# Patient Record
Sex: Female | Born: 1945 | Race: White | Hispanic: No | Smoking: Former smoker
Health system: Southern US, Community
[De-identification: ages and names within clinical notes are randomized; demographics above are authoritative.]

## PROBLEM LIST (undated history)

## (undated) DIAGNOSIS — E039 Hypothyroidism, unspecified: Secondary | ICD-10-CM

## (undated) DIAGNOSIS — I499 Cardiac arrhythmia, unspecified: Secondary | ICD-10-CM

## (undated) DIAGNOSIS — G629 Polyneuropathy, unspecified: Secondary | ICD-10-CM

## (undated) DIAGNOSIS — Z8489 Family history of other specified conditions: Secondary | ICD-10-CM

## (undated) DIAGNOSIS — Z87442 Personal history of urinary calculi: Secondary | ICD-10-CM

## (undated) DIAGNOSIS — M1990 Unspecified osteoarthritis, unspecified site: Secondary | ICD-10-CM

## (undated) DIAGNOSIS — R7303 Prediabetes: Secondary | ICD-10-CM

## (undated) DIAGNOSIS — I10 Essential (primary) hypertension: Secondary | ICD-10-CM

## (undated) DIAGNOSIS — E785 Hyperlipidemia, unspecified: Secondary | ICD-10-CM

## (undated) DIAGNOSIS — N200 Calculus of kidney: Secondary | ICD-10-CM

## (undated) HISTORY — DX: Essential (primary) hypertension: I10

## (undated) HISTORY — PX: TONSILLECTOMY: SUR1361

## (undated) HISTORY — DX: Hyperlipidemia, unspecified: E78.5

## (undated) HISTORY — DX: Hypothyroidism, unspecified: E03.9

## (undated) HISTORY — DX: Polyneuropathy, unspecified: G62.9

## (undated) HISTORY — DX: Calculus of kidney: N20.0

## (undated) HISTORY — PX: POLYPECTOMY: SHX149

## (undated) SURGERY — ATRIAL FIBRILLATION ABLATION
Anesthesia: General

---

## 1978-03-06 HISTORY — PX: EXCISION MORTON'S NEUROMA: SHX5013

## 1995-03-07 HISTORY — PX: CARPAL TUNNEL RELEASE: SHX101

## 1998-03-06 HISTORY — PX: BREAST ENHANCEMENT SURGERY: SHX7

## 1998-09-15 ENCOUNTER — Ambulatory Visit (HOSPITAL_BASED_OUTPATIENT_CLINIC_OR_DEPARTMENT_OTHER): Admission: RE | Admit: 1998-09-15 | Discharge: 1998-09-15 | Payer: Self-pay | Admitting: Plastic Surgery

## 1999-01-11 ENCOUNTER — Other Ambulatory Visit: Admission: RE | Admit: 1999-01-11 | Discharge: 1999-01-11 | Payer: Self-pay | Admitting: Obstetrics and Gynecology

## 1999-08-16 ENCOUNTER — Other Ambulatory Visit: Admission: RE | Admit: 1999-08-16 | Discharge: 1999-08-16 | Payer: Self-pay | Admitting: Obstetrics and Gynecology

## 1999-08-16 ENCOUNTER — Encounter (INDEPENDENT_AMBULATORY_CARE_PROVIDER_SITE_OTHER): Payer: Self-pay

## 1999-08-16 ENCOUNTER — Encounter: Payer: Self-pay | Admitting: Orthopedic Surgery

## 1999-08-16 ENCOUNTER — Encounter: Admission: RE | Admit: 1999-08-16 | Discharge: 1999-08-16 | Payer: Self-pay | Admitting: Orthopedic Surgery

## 2000-02-06 ENCOUNTER — Other Ambulatory Visit: Admission: RE | Admit: 2000-02-06 | Discharge: 2000-02-06 | Payer: Self-pay | Admitting: Obstetrics and Gynecology

## 2001-01-17 ENCOUNTER — Encounter: Payer: Self-pay | Admitting: Family Medicine

## 2001-01-17 ENCOUNTER — Encounter: Admission: RE | Admit: 2001-01-17 | Discharge: 2001-01-17 | Payer: Self-pay | Admitting: Family Medicine

## 2001-02-28 ENCOUNTER — Other Ambulatory Visit: Admission: RE | Admit: 2001-02-28 | Discharge: 2001-02-28 | Payer: Self-pay | Admitting: Obstetrics and Gynecology

## 2002-03-02 ENCOUNTER — Encounter: Payer: Self-pay | Admitting: Emergency Medicine

## 2002-03-02 ENCOUNTER — Emergency Department (HOSPITAL_COMMUNITY): Admission: EM | Admit: 2002-03-02 | Discharge: 2002-03-02 | Payer: Self-pay | Admitting: Emergency Medicine

## 2002-03-03 ENCOUNTER — Other Ambulatory Visit: Admission: RE | Admit: 2002-03-03 | Discharge: 2002-03-03 | Payer: Self-pay | Admitting: Obstetrics and Gynecology

## 2003-03-07 HISTORY — PX: ABDOMINAL HYSTERECTOMY: SHX81

## 2003-03-07 HISTORY — PX: TARSAL TUNNEL RELEASE: SUR1099

## 2003-03-09 ENCOUNTER — Other Ambulatory Visit: Admission: RE | Admit: 2003-03-09 | Discharge: 2003-03-09 | Payer: Self-pay | Admitting: Obstetrics and Gynecology

## 2003-08-20 ENCOUNTER — Inpatient Hospital Stay (HOSPITAL_COMMUNITY): Admission: RE | Admit: 2003-08-20 | Discharge: 2003-08-22 | Payer: Self-pay | Admitting: Obstetrics and Gynecology

## 2003-08-20 ENCOUNTER — Encounter (INDEPENDENT_AMBULATORY_CARE_PROVIDER_SITE_OTHER): Payer: Self-pay | Admitting: Specialist

## 2004-03-06 HISTORY — PX: KNEE ARTHROSCOPY: SUR90

## 2004-07-13 ENCOUNTER — Ambulatory Visit: Payer: Self-pay | Admitting: Family Medicine

## 2004-07-22 ENCOUNTER — Ambulatory Visit: Payer: Self-pay | Admitting: Family Medicine

## 2004-07-26 ENCOUNTER — Ambulatory Visit: Payer: Self-pay | Admitting: Family Medicine

## 2004-07-28 ENCOUNTER — Encounter: Admission: RE | Admit: 2004-07-28 | Discharge: 2004-07-28 | Payer: Self-pay | Admitting: Family Medicine

## 2004-08-17 ENCOUNTER — Encounter: Admission: RE | Admit: 2004-08-17 | Discharge: 2004-08-17 | Payer: Self-pay | Admitting: Family Medicine

## 2004-08-17 ENCOUNTER — Other Ambulatory Visit: Admission: RE | Admit: 2004-08-17 | Discharge: 2004-08-17 | Payer: Self-pay | Admitting: Interventional Radiology

## 2004-08-17 ENCOUNTER — Encounter (INDEPENDENT_AMBULATORY_CARE_PROVIDER_SITE_OTHER): Payer: Self-pay | Admitting: Specialist

## 2004-08-25 ENCOUNTER — Other Ambulatory Visit: Admission: RE | Admit: 2004-08-25 | Discharge: 2004-08-25 | Payer: Self-pay | Admitting: Obstetrics and Gynecology

## 2004-09-23 ENCOUNTER — Ambulatory Visit: Payer: Self-pay | Admitting: Family Medicine

## 2004-10-13 ENCOUNTER — Ambulatory Visit (HOSPITAL_COMMUNITY): Admission: RE | Admit: 2004-10-13 | Discharge: 2004-10-13 | Payer: Self-pay | Admitting: Neurology

## 2005-01-20 ENCOUNTER — Ambulatory Visit: Payer: Self-pay | Admitting: Family Medicine

## 2005-05-25 ENCOUNTER — Ambulatory Visit: Payer: Self-pay | Admitting: Family Medicine

## 2005-05-26 ENCOUNTER — Ambulatory Visit: Payer: Self-pay | Admitting: Family Medicine

## 2005-05-30 ENCOUNTER — Encounter: Admission: RE | Admit: 2005-05-30 | Discharge: 2005-05-30 | Payer: Self-pay | Admitting: Family Medicine

## 2005-06-15 ENCOUNTER — Ambulatory Visit: Payer: Self-pay | Admitting: Family Medicine

## 2005-09-15 ENCOUNTER — Ambulatory Visit: Payer: Self-pay | Admitting: Family Medicine

## 2005-10-03 ENCOUNTER — Ambulatory Visit: Payer: Self-pay | Admitting: Family Medicine

## 2006-08-31 ENCOUNTER — Encounter: Admission: RE | Admit: 2006-08-31 | Discharge: 2006-08-31 | Payer: Self-pay | Admitting: Family Medicine

## 2006-09-19 DIAGNOSIS — K219 Gastro-esophageal reflux disease without esophagitis: Secondary | ICD-10-CM | POA: Insufficient documentation

## 2006-09-19 DIAGNOSIS — I10 Essential (primary) hypertension: Secondary | ICD-10-CM | POA: Insufficient documentation

## 2006-10-05 ENCOUNTER — Ambulatory Visit: Payer: Self-pay | Admitting: Family Medicine

## 2006-10-05 LAB — CONVERTED CEMR LAB
ALT: 44 units/L — ABNORMAL HIGH (ref 0–35)
AST: 35 units/L (ref 0–37)
Albumin: 4.3 g/dL (ref 3.5–5.2)
Alkaline Phosphatase: 51 units/L (ref 39–117)
BUN: 13 mg/dL (ref 6–23)
Basophils Absolute: 0 10*3/uL (ref 0.0–0.1)
Basophils Relative: 0.6 % (ref 0.0–1.0)
Bilirubin, Direct: 0.1 mg/dL (ref 0.0–0.3)
CO2: 33 meq/L — ABNORMAL HIGH (ref 19–32)
Calcium: 10.1 mg/dL (ref 8.4–10.5)
Chloride: 101 meq/L (ref 96–112)
Cholesterol: 191 mg/dL (ref 0–200)
Creatinine, Ser: 0.9 mg/dL (ref 0.4–1.2)
Eosinophils Absolute: 0.1 10*3/uL (ref 0.0–0.6)
Eosinophils Relative: 2.3 % (ref 0.0–5.0)
GFR calc Af Amer: 82 mL/min
GFR calc non Af Amer: 68 mL/min
Glucose, Bld: 106 mg/dL — ABNORMAL HIGH (ref 70–99)
HCT: 40 % (ref 36.0–46.0)
HDL: 46.4 mg/dL (ref >39.0)
Hemoglobin: 13.8 g/dL (ref 12.0–15.0)
LDL Cholesterol: 105 mg/dL — ABNORMAL HIGH (ref 0–99)
Lymphocytes Relative: 44.2 % (ref 12.0–46.0)
MCHC: 34.4 g/dL (ref 30.0–36.0)
MCV: 92.6 fL (ref 78.0–100.0)
Monocytes Absolute: 0.7 10*3/uL (ref 0.2–0.7)
Monocytes Relative: 14.6 % — ABNORMAL HIGH (ref 3.0–11.0)
Neutro Abs: 1.8 10*3/uL (ref 1.4–7.7)
Neutrophils Relative %: 38.3 % — ABNORMAL LOW (ref 43.0–77.0)
Platelets: 319 10*3/uL (ref 150–400)
Potassium: 3.8 meq/L (ref 3.5–5.1)
RBC: 4.32 M/uL (ref 3.87–5.11)
RDW: 12.3 % (ref 11.5–14.6)
Sodium: 144 meq/L (ref 135–145)
TSH: 1.18 microintl units/mL (ref 0.35–5.50)
Total Bilirubin: 0.9 mg/dL (ref 0.3–1.2)
Total CHOL/HDL Ratio: 4.1
Total Protein: 6.8 g/dL (ref 6.0–8.3)
Triglycerides: 198 mg/dL — ABNORMAL HIGH (ref 0–149)
VLDL: 40 mg/dL (ref 0–40)
WBC: 4.6 10*3/uL (ref 4.5–10.5)

## 2006-10-12 ENCOUNTER — Ambulatory Visit: Payer: Self-pay | Admitting: Family Medicine

## 2006-10-12 DIAGNOSIS — E042 Nontoxic multinodular goiter: Secondary | ICD-10-CM | POA: Insufficient documentation

## 2006-10-12 DIAGNOSIS — E7800 Pure hypercholesterolemia, unspecified: Secondary | ICD-10-CM | POA: Insufficient documentation

## 2006-10-26 ENCOUNTER — Encounter: Payer: Self-pay | Admitting: Family Medicine

## 2006-10-26 ENCOUNTER — Ambulatory Visit: Payer: Self-pay | Admitting: Internal Medicine

## 2006-11-06 ENCOUNTER — Telehealth (INDEPENDENT_AMBULATORY_CARE_PROVIDER_SITE_OTHER): Payer: Self-pay | Admitting: *Deleted

## 2007-01-29 ENCOUNTER — Ambulatory Visit: Payer: Self-pay | Admitting: Family Medicine

## 2007-03-07 HISTORY — PX: PAROTID GLAND TUMOR EXCISION: SHX5221

## 2007-11-01 ENCOUNTER — Ambulatory Visit: Payer: Self-pay | Admitting: Internal Medicine

## 2007-11-08 ENCOUNTER — Encounter: Payer: Self-pay | Admitting: Internal Medicine

## 2007-11-08 ENCOUNTER — Ambulatory Visit: Payer: Self-pay | Admitting: Internal Medicine

## 2007-11-12 ENCOUNTER — Encounter: Payer: Self-pay | Admitting: Internal Medicine

## 2007-12-06 ENCOUNTER — Ambulatory Visit: Payer: Self-pay | Admitting: Family Medicine

## 2007-12-06 LAB — CONVERTED CEMR LAB
ALT: 28 units/L (ref 0–35)
AST: 22 units/L (ref 0–37)
Albumin: 4 g/dL (ref 3.5–5.2)
Alkaline Phosphatase: 51 units/L (ref 39–117)
BUN: 18 mg/dL (ref 6–23)
Basophils Absolute: 0 10*3/uL (ref 0.0–0.1)
Basophils Relative: 0.9 % (ref 0.0–3.0)
Bilirubin Urine: NEGATIVE
Bilirubin, Direct: 0.1 mg/dL (ref 0.0–0.3)
Blood in Urine, dipstick: NEGATIVE
CO2: 28 meq/L (ref 19–32)
Calcium: 9.4 mg/dL (ref 8.4–10.5)
Chloride: 106 meq/L (ref 96–112)
Cholesterol: 189 mg/dL (ref 0–200)
Creatinine, Ser: 1.1 mg/dL (ref 0.4–1.2)
Direct LDL: 110.6 mg/dL
Eosinophils Absolute: 0.1 10*3/uL (ref 0.0–0.7)
Eosinophils Relative: 3 % (ref 0.0–5.0)
GFR calc Af Amer: 65 mL/min
GFR calc non Af Amer: 53 mL/min
Glucose, Bld: 98 mg/dL (ref 70–99)
Glucose, Urine, Semiquant: NEGATIVE
HCT: 37.9 % (ref 36.0–46.0)
HDL: 42.3 mg/dL (ref >39.0)
Hemoglobin: 13.2 g/dL (ref 12.0–15.0)
Ketones, urine, test strip: NEGATIVE
Lymphocytes Relative: 41.5 % (ref 12.0–46.0)
MCHC: 34.9 g/dL (ref 30.0–36.0)
MCV: 91.4 fL (ref 78.0–100.0)
Monocytes Absolute: 0.4 10*3/uL (ref 0.1–1.0)
Monocytes Relative: 8.8 % (ref 3.0–12.0)
Neutro Abs: 2.4 10*3/uL (ref 1.4–7.7)
Neutrophils Relative %: 45.8 % (ref 43.0–77.0)
Nitrite: NEGATIVE
Platelets: 276 10*3/uL (ref 150–400)
Potassium: 4.3 meq/L (ref 3.5–5.1)
Protein, U semiquant: NEGATIVE
RBC: 4.15 M/uL (ref 3.87–5.11)
RDW: 12.5 % (ref 11.5–14.6)
Sodium: 142 meq/L (ref 135–145)
Specific Gravity, Urine: 1.015
TSH: 1.21 microintl units/mL (ref 0.35–5.50)
Total Bilirubin: 0.8 mg/dL (ref 0.3–1.2)
Total CHOL/HDL Ratio: 4.5
Total Protein: 6.4 g/dL (ref 6.0–8.3)
Triglycerides: 227 mg/dL (ref 0–149)
Urobilinogen, UA: 0.2
VLDL: 45 mg/dL — ABNORMAL HIGH (ref 0–40)
WBC: 5 10*3/uL (ref 4.5–10.5)
pH: 6

## 2007-12-12 ENCOUNTER — Encounter: Payer: Self-pay | Admitting: Family Medicine

## 2007-12-13 ENCOUNTER — Telehealth: Payer: Self-pay | Admitting: Family Medicine

## 2007-12-13 ENCOUNTER — Ambulatory Visit: Payer: Self-pay | Admitting: Family Medicine

## 2007-12-13 DIAGNOSIS — Z87442 Personal history of urinary calculi: Secondary | ICD-10-CM | POA: Insufficient documentation

## 2007-12-13 DIAGNOSIS — G609 Hereditary and idiopathic neuropathy, unspecified: Secondary | ICD-10-CM | POA: Insufficient documentation

## 2007-12-23 ENCOUNTER — Telehealth: Payer: Self-pay | Admitting: Family Medicine

## 2008-01-02 ENCOUNTER — Telehealth: Payer: Self-pay | Admitting: Family Medicine

## 2008-01-15 ENCOUNTER — Telehealth: Payer: Self-pay | Admitting: Family Medicine

## 2008-02-05 ENCOUNTER — Telehealth: Payer: Self-pay | Admitting: *Deleted

## 2008-03-03 ENCOUNTER — Telehealth: Payer: Self-pay | Admitting: Family Medicine

## 2008-03-11 ENCOUNTER — Ambulatory Visit: Payer: Self-pay | Admitting: Family Medicine

## 2008-04-24 ENCOUNTER — Telehealth: Payer: Self-pay | Admitting: Family Medicine

## 2008-12-29 ENCOUNTER — Telehealth: Payer: Self-pay | Admitting: Family Medicine

## 2009-01-01 ENCOUNTER — Telehealth: Payer: Self-pay | Admitting: Family Medicine

## 2009-04-09 ENCOUNTER — Telehealth: Payer: Self-pay | Admitting: Family Medicine

## 2009-04-14 ENCOUNTER — Telehealth: Payer: Self-pay | Admitting: Family Medicine

## 2009-04-20 ENCOUNTER — Ambulatory Visit: Payer: Self-pay | Admitting: Family Medicine

## 2009-04-20 LAB — CONVERTED CEMR LAB
ALT: 23 units/L (ref 0–35)
AST: 19 units/L (ref 0–37)
Albumin: 4.2 g/dL (ref 3.5–5.2)
Alkaline Phosphatase: 55 units/L (ref 39–117)
BUN: 20 mg/dL (ref 6–23)
Basophils Absolute: 0 10*3/uL (ref 0.0–0.1)
Basophils Relative: 0.6 % (ref 0.0–3.0)
Bilirubin Urine: NEGATIVE
Bilirubin, Direct: 0.1 mg/dL (ref 0.0–0.3)
CO2: 29 meq/L (ref 19–32)
Calcium: 9.6 mg/dL (ref 8.4–10.5)
Chloride: 109 meq/L (ref 96–112)
Cholesterol: 170 mg/dL (ref 0–200)
Creatinine, Ser: 1 mg/dL (ref 0.4–1.2)
Direct LDL: 95.3 mg/dL
Eosinophils Absolute: 0.1 10*3/uL (ref 0.0–0.7)
Eosinophils Relative: 1.9 % (ref 0.0–5.0)
GFR calc non Af Amer: 59.29 mL/min (ref >60)
Glucose, Bld: 102 mg/dL — ABNORMAL HIGH (ref 70–99)
HCT: 40.8 % (ref 36.0–46.0)
HDL: 46.9 mg/dL (ref >39.00)
Hemoglobin, Urine: NEGATIVE
Hemoglobin: 13.8 g/dL (ref 12.0–15.0)
Ketones, ur: NEGATIVE mg/dL
Lymphocytes Relative: 36.8 % (ref 12.0–46.0)
Lymphs Abs: 1.5 10*3/uL (ref 0.7–4.0)
MCHC: 33.7 g/dL (ref 30.0–36.0)
MCV: 92.5 fL (ref 78.0–100.0)
Monocytes Absolute: 0.3 10*3/uL (ref 0.1–1.0)
Monocytes Relative: 8.2 % (ref 3.0–12.0)
Neutro Abs: 2.3 10*3/uL (ref 1.4–7.7)
Neutrophils Relative %: 52.5 % (ref 43.0–77.0)
Nitrite: NEGATIVE
Platelets: 259 10*3/uL (ref 150.0–400.0)
Potassium: 4.3 meq/L (ref 3.5–5.1)
RBC: 4.41 M/uL (ref 3.87–5.11)
RDW: 11.8 % (ref 11.5–14.6)
Sodium: 144 meq/L (ref 135–145)
Specific Gravity, Urine: 1.02 (ref 1.000–1.030)
TSH: 1.06 microintl units/mL (ref 0.35–5.50)
Total Bilirubin: 0.5 mg/dL (ref 0.3–1.2)
Total CHOL/HDL Ratio: 4
Total Protein, Urine: NEGATIVE mg/dL
Total Protein: 6.6 g/dL (ref 6.0–8.3)
Triglycerides: 257 mg/dL — ABNORMAL HIGH (ref 0.0–149.0)
Urine Glucose: NEGATIVE mg/dL
Urobilinogen, UA: 0.2 (ref 0.0–1.0)
VLDL: 51.4 mg/dL — ABNORMAL HIGH (ref 0.0–40.0)
WBC: 4.2 10*3/uL — ABNORMAL LOW (ref 4.5–10.5)
pH: 5.5 (ref 5.0–8.0)

## 2009-04-27 ENCOUNTER — Ambulatory Visit: Payer: Self-pay | Admitting: Family Medicine

## 2010-03-06 HISTORY — PX: SPINAL CORD STIMULATOR IMPLANT: SHX2422

## 2010-04-05 NOTE — Progress Notes (Signed)
Summary: Lipitor  Phone Note Call from Patient   Caller: Patient Call For: Dr. Tawanna Cooler Summary of Call: Pt researched Lipitor (online), and found that she can get a 30 day trial if Dr. Tawanna Cooler will write a 30 day supply and let her pick up the prescription. Please call pt when ready.[phone] Initial call taken by: Lynann Beaver CMA,  February 05, 2008 12:12 PM  Follow-up for Phone Call        [facility] Follow-up by: Kern Reap CMA,  February 05, 2008 1:43 PM    New/Updated Medications: LIPITOR 10 MG TABS (ATORVASTATIN CALCIUM) one tab at bedtime ok,Now prescription for Lipitor 10 mg dispense 30 tablets directions one nightly 11 refills, then call Her  to come by and pick up the prescription  Prescriptions: LIPITOR 10 MG TABS (ATORVASTATIN CALCIUM) one tab at bedtime  #30 x 0   Entered by:   Kern Reap CMA   Authorized by:   Roderick Pee MD   Signed by:   Kern Reap CMA on 02/05/2008   Method used:   [facility] [address]  [id number]       [address]       [place], [place]  [address]       Ph: [phone] or [phone]       Fax: [phone]   RxID:   [id number] LIPITOR 10 MG TABS (ATORVASTATIN CALCIUM) one tab at bedtime  #30 x 11   Entered by:   Kern Reap CMA   Authorized by:   Roderick Pee MD   Signed by:   Kern Reap CMA on 02/05/2008   Method used:   Print then Give to Patient   RxID:   [id number]

## 2010-04-05 NOTE — Progress Notes (Signed)
Summary: Mail-order Ambien RX request  Phone Note Call from Patient   Caller: Patient Call For: Jock Mahon Summary of Call: Pt had CPX this AM, reports Dr Tawanna Cooler forgot to give her Rx for Ambien. Call pt home, leave a message for her to pick-up written Rx. Initial call taken by: Sid Falcon LPN,  December 13, 2007 5:25 PM  Follow-up for Phone Call        ambien 10 mg, dispensed 30 tablets, directions one at bedtime as needed for sleep 5 refills.  Okay to call in or if patient wants a hard copy of her prescription have her come pick it up Follow-up by: Roderick Pee MD,  December 16, 2007 1:11 PM  Additional Follow-up for Phone Call Additional follow up Details #1::        left message on machine  Additional Follow-up by: Kern Reap CMA,  December 17, 2007 11:03 AM    Additional Follow-up for Phone Call Additional follow up Details #2::    Pt called back, requesting generic mail order, call [phone] when ready for pick-up. Sid Falcon LPN  December 17, 2007 12:49 PM   Additional Follow-up for Phone Call Additional follow up Details #3:: Details for Additional Follow-up Action Taken: left message on machine that rx is ready for pick up Additional Follow-up by: Kern Reap CMA,  December 17, 2007 2:09 PM  New/Updated Medications: ZOLPIDEM TARTRATE 10 MG TABS (ZOLPIDEM TARTRATE) take one tab at bedtime as needed sleep   Prescriptions: ZOLPIDEM TARTRATE 10 MG TABS (ZOLPIDEM TARTRATE) take one tab at bedtime as needed sleep  #30 x 5   Entered by:   Kern Reap CMA   Authorized by:   Roderick Pee MD   Signed by:   Kern Reap CMA on 12/17/2007   Method used:   Print then Give to Patient   RxID:   [id number]

## 2010-04-05 NOTE — Progress Notes (Signed)
Summary: resent rx       New/Updated Medications: LISINOPRIL-HYDROCHLOROTHIAZIDE 20-12.5 MG  TABS (LISINOPRIL-HYDROCHLOROTHIAZIDE) half tab by mouth   once daily Prescriptions: LISINOPRIL-HYDROCHLOROTHIAZIDE 20-12.5 MG  TABS (LISINOPRIL-HYDROCHLOROTHIAZIDE) half tab by mouth   once daily  #90 x 1   Entered by:   Kern Reap CMA (AAMA)   Authorized by:   Roderick Pee MD   Signed by:   Kern Reap CMA (AAMA) on 01/01/2009   Method used:   Electronically to        [facility] (mail-order)             ,          Ph: [phone]       Fax: [phone]   RxID:   [id number]

## 2010-04-05 NOTE — Progress Notes (Signed)
Summary: vitamin D?  Phone Note Call from Patient Call back at Work Phone [phone]   Summary of Call: Request Vitamin D, B and whatever else, levels be checked with labs already scheduled 2-15-11for idiopathic peripheral neuropathy.   Initial call taken by: Rudy Jew, RN,  April 14, 2009 8:25 AM  Follow-up for Phone Call        okay to add B12 level folate level iron, iron saturation, sed rate, ANA Follow-up by: Roderick Pee MD,  April 14, 2009 1:24 PM  Additional Follow-up for Phone Call Additional follow up Details #1::        What about the Vit D the patient requested? Additional Follow-up by: Rudy Jew, RN,  April 14, 2009 1:49 PM    Additional Follow-up for Phone Call Additional follow up Details #2::    ok Follow-up by: Roderick Pee MD,  April 15, 2009 12:08 PM

## 2010-04-05 NOTE — Assessment & Plan Note (Signed)
Summary: CPX/CCM   Vital Signs:  Patient Profile:   65 Years Old Female Height:     56 inches Weight:      196 pounds Temp:     98.3 degrees F oral BP sitting:   130 / 80  (left arm) Cuff size:   regular  Vitals Entered By: Sid Falcon LPN (December 13, 2007 8:40 AM)                 Chief Complaint:  CPX and labbs done.  History of Present Illness: Brandi Spence is a 65 year old single female, who comes in today for evaluation of hyperlipidemia, hypertension, hypothyroidism, a left thyroid cyst, and peripheral neuropathy.  Her hypertension is managed with lisinopril, H. CT 2012.5 daily, and clonidine .1 daily.  BP 130/80.  The hyperlipidemia is managed with Zocor 20 mg a day at bedtime.  Lipids are ago, but she would like to stop it because she thinks it may be contributing to her leg pain.  Her hyaily.  The cyst in her left thyroid gland has remained stable.  The last ultrasound 2008 should the lesions to be stable   [facility] her neuropathy.  She is currently taking their to under milligrams b.i.d., Ambien, 10 mg at bedtime, dopamine 8 25 mg 3 tablets at bedtime.  Her last shot was 2008 she a flu shot recently and the shingles.  Vaccine.    Current Allergies: No known allergies   Past Medical History:    Reviewed history from 10/12/2006 and no changes required:       GERD       Hypertension       PMS       Hyperlipidemia       thyroid nodule       Peripheral neuropathy       childbirth x 2       BTL       carpal tunnel syndrome left hand       TAH and BSO for nonmalignant reasons       surgery right knee       Nephrolithiasis, hx of       reduction mammoplasty.              Morton's neuroma x 2   Family History:    Reviewed history from 09/19/2006 and no changes required:       Family History Other cancer-Colon, Breast       Family History of Stroke M 1st degree relative <50       Fam hx Vericose  Family History Depression       Family History Diabetes 1st degree relative       Family History Hypertension  Social History:    Reviewed history from 09/19/2006 and no changes required:       Single       Regular exercise-no       Occupation:       Former Smoker       Alcohol use-no       Drug use-no       Regular exercise-yes   Risk Factors:  Tobacco use:  quit    Year quit:  65 Drug use:  no Alcohol use:  no Exercise:  yes   Review of Systems      See HPI   Physical Exam  General:     Well-developed,well-nourished,in no acute distress; alert,appropriate and cooperative  throughout examination Head:     Normocephalic and atraumatic without obvious abnormalities. No apparent alopecia or balding. Eyes:     No corneal or conjunctival inflammation noted. EOMI. Perrla. Funduscopic exam benign, without hemorrhages, exudates or papilledema. Vision grossly normal. Ears:     External ear exam shows no significant lesions or deformities.  Otoscopic examination reveals clear canals, tympanic membranes are intact bilaterally without bulging, retraction, inflammation or discharge. Hearing is grossly normal bilaterally. Nose:     External nasal examination shows no deformity or inflammation. Nasal mucosa are pink and moist without lesions or exudates. Mouth:     Oral mucosa and oropharynx without lesions or exudates.  Teeth in good repair. Neck:     there is a robin egg size, soft, cystic lesion in the superior to mid pole of her left thyroid.  There is also a pea-sized lesion.  The superior portion of her right ear consistent with a cyst Chest Wall:     No deformities, masses, or tenderness noted. Breasts:     No mass, nodules, thickening, tenderness, bulging, retraction, inflamation, nipple discharge or skin changes noted.  scars from reduction mammoplasty Lungs:     Normal respiratory effort, chest expands symmetrically. Lungs are clear to auscultation, no crackles or  wheezes. Heart:     Normal rate and regular rhythm. S1 and S2 normal without gallop, murmur, click, rub or other extra sounds. Abdomen:     Bowel sounds positive,abdomen soft and non-tender without masses, organomegaly or hernias noted. Msk:     No deformity or scoliosis noted of thoracic or lumbar spine.   Pulses:     R and L carotid,radial,femoral,dorsalis pedis and posterior tibial pulses are full and equal bilaterally Extremities:     No clubbing, cyanosis, edema, or deformity noted with normal full range of motion of all joints.   Neurologic:     No cranial nerve deficits noted. Station and gait are normal. Plantar reflexes are down-going bilaterally. DTRs are symmetrical throughout. Sensory, motor and coordinative functions appear intact. Skin:     Intact without suspicious lesions or rashes Cervical Nodes:     No lymphadenopathy noted Axillary Nodes:     No palpable lymphadenopathy Inguinal Nodes:     No significant adenopathy Psych:     Cognition and judgment appear intact. Alert and cooperative with normal attention span and concentration. 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 1 at bedtime   Problem # 4:  HYPERTENSION (ICD-401.9) Assessment: Improved  Her updated medication list for this problem includes:    Lisinopril-hydrochlorothiazide 20-12.5 Mg Tabs (Lisinopril-hydrochlorothiazide) .Marland Kitchen... 1 once daily    Clonidine Hcl 0.1 Mg Tabs (Clonidine hcl) .Marland Kitchen... 1 once daily  Orders: EKG w/ Interpretation (93000)   Complete Medication List: 1)  Lisinopril-hydrochlorothiazide 20-12.5 Mg Tabs  (Lisinopril-hydrochlorothiazide) .Marland Kitchen.. 1 once daily 2)  Clonidine Hcl 0.1 Mg Tabs (Clonidine hcl) .Marland Kitchen.. 1 once daily 3)  Zocor 20 Mg Tabs (Simvastatin) .Marland Kitchen.. 1 at bedtime 4)  Synthroid 100 Mcg Tabs (Levothyroxine sodium) .Marland Kitchen.. 1 tab once daily 5)  Lyrica 100 Mg Caps (Pregabalin) .Marland Kitchen.. 1 tab two times a day 6)  Ambien 10 Mg Tabs (Zolpidem tartrate) .... As needed hs 7)  Topiramate 25 Mg Tabs (Topiramate) .... 3 tabs at hs   Patient Instructions: 1)  Please schedule a follow-up appointment in 1 year. 2)  Schedule your mammogram. 3)  Schedule a colonoscopy/sigmoidoscopy to help detect colon cancer. 4)  Take an Aspirin every day. 5)  stopped the Zocor for one month to see if it has an effect on your like symptoms.   Prescriptions: SYNTHROID 100 MCG  TABS (LEVOTHYROXINE SODIUM) 1 tab once daily  #100 x 3   Entered and Authorized by:   Roderick Pee MD   Signed by:   Roderick Pee MD on 12/13/2007   Method used:   Print then Give to Patient   RxID:   [id number] ZOCOR 20 MG  TABS (SIMVASTATIN) 1 at bedtime  #100 x 3   Entered and Authorized by:   Roderick Pee MD   Signed by:   Roderick Pee MD on 12/13/2007   Method used:   Print then Give to Patient   RxID:   [id number] CLONIDINE HCL 0.1 MG  TABS (CLONIDINE HCL) 1 once daily  #100 x 3   Entered and Authorized by:   Roderick Pee MD   Signed by:   Roderick Pee MD on 12/13/2007   Method used:   Print then Give to Patient   RxID:   [id number] LISINOPRIL-HYDROCHLOROTHIAZIDE 20-12.5 MG  TABS (LISINOPRIL-HYDROCHLOROTHIAZIDE) 1 once daily  #100 x 3   Entered and Authorized by:   Roderick Pee MD   Signed by:   Roderick Pee MD on 12/13/2007   Method used:   Print then Give to Patient   RxID:   [id number] SYNTHROID 100 MCG  TABS (LEVOTHYROXINE SODIUM) 1 tab once daily  #100 x 3   Entered and Authorized by:   Roderick Pee MD   Signed by:   Roderick Pee MD on 12/13/2007   Method used:   [facility]         [facility] [address]  [facility]       [address]       [place], [place]  [address]       Ph: [phone] or [phone]       Fax: [phone]   RxID:   [id number] CLONIDINE HCL 0.1 MG  TABS (CLONIDINE HCL) 1 once daily  #100 x 3   Entered and Authorized by:   Roderick Pee MD   Signed by:   Roderick Pee MD on 12/13/2007   Method used:   [facility]        [facility]  [address]  [facility]       [address]       [place], [place]  [address]       Ph: [phone] or [phone]       Fax: [phone]   RxID:   [id number] LISINOPRIL-HYDROCHLOROTHIAZIDE 20-12.5 MG  TABS (LISINOPRIL-HYDROCHLOROTHIAZIDE) 1 once daily  #100 x 3   Entered and Authorized by:   Roderick Pee MD   Signed by:   Roderick Pee MD on 12/13/2007   Method used:   [facility]        [facility]  [address]  [facility]       [address]       [facility],  [place]  [address]       Ph: [phone] or [phone]       Fax: [phone]   RxID:   [id number]  ]
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
## 2010-04-05 NOTE — Letter (Signed)
Summary: Patient Notice- Polyp Results  [facility]  [address] [place], [place] [address]   Phone: [phone]  Fax: [phone]        November 12, 2007 MRN: [id number]    Brandi Spence [address] [place], [place]  [address]    Dear [name],  I am pleased to inform you that the colon polyp(s) removed during your recent colonoscopy was (were) found to be benign (no cancer detected) upon pathologic examination.The polyp was adenomatous ( precancerous)  I recommend you have a repeat colonoscopy examination in 5_ years to look for recurrent polyps, as having colon polyps increases your risk for having recurrent polyps or even colon cancer in the future.  Should you develop new or worsening symptoms of abdominal pain, bowel habit changes or bleeding from the rectum or bowels, please schedule an evaluation with either your primary care physician or with me.  Additional information/recommendations:  _x_ No further action with gastroenterology is needed at this time. Please      follow-up with your primary care physician for your other healthcare      ne  Please call us if you are having persistent problems or have questions about your condition that have not been fully answered at this time.  Sincerely,  Hart Carwin MD  This letter has been electronically signed by your physician.

## 2010-04-05 NOTE — Assessment & Plan Note (Signed)
Summary: cpx/cjr   Vital Signs:  Patient profile:   65 year old female Height:      66.75 inches Weight:      195 pounds BMI:     30.88 Temp:     98.5 degrees F oral BP sitting:   118 / 78  (left arm) Cuff size:   regular  Vitals Entered By: Kern Reap CMA Duncan Dull) (April 27, 2009 3:22 PM)  History of Present Illness: Brandi Spence is a 65 year old, married female, nonsmoker, who comes in today for evaluation of multiple issues.  She takes Synthroid 100 micrograms daily for hypothyroidism a TSH level is 1.06 continue the above dose.  She also has a history of a thyroid nodule on the left.  We did an ultrasound biopsy couple years ago, was negative.  She takes Crestor 10 mg daily for hyperlipidemia.  Lipids are goal.  She takes lisinopril 20 -- 12.5 daily for hypertension.  BP 118/78.  She takes later to 100 mg q.i.d. and Topamax 75 mg nightly.  These were given to her by her neurologist.  She has a peripheral neuropathy of unknown etiology.  She takes clonidine .1 daily for hot flushes and 81-mg baby aspirin and have also given her alprazolam .5 to take p.r.n.Marland Kitchen  Tetanus 2,008 seasonal flu 2010 shingles 2008 .  Pneumovax next year.  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  She also has tinnitus , which is new in her right ear.  No hearing loss.  She also has a lesion on her right face has been scaling ill peel then peel off heel and peel off  Allergies (verified): No Known Drug Allergies  Past History:  Past medical, surgical, family and social histories (including risk factors) reviewed, and no changes noted (except as noted below).  Past Medical History: Reviewed history from 12/13/2007 and no changes required. GERD Hypertension PMS Hyperlipidemia thyroid nodule Peripheral neuropathy childbirth x 2 BTL carpal tunnel syndrome left hand TAH and BSO for nonmalignant reasons surgery right knee Nephrolithiasis, hx of reduction  mammoplasty.  Morton's neuroma x 2  Past Surgical History: Reviewed history from 03/11/2008 and no changes required. CB x 2 BTL CT syndrome-Lt TAH/BSO Morton;s Neuromutz Colonoscopy-2004 right parotid gland removal 09  Family History: Reviewed history from 09/19/2006 and no changes required. Family History Other cancer-Colon, Breast Family History of Stroke M 1st degree relative <50 Fam hx Vericose Family History Depression Family History Diabetes 1st degree relative Family History Hypertension  Social History: Reviewed history from 12/13/2007 and no changes required. Single Regular exercise-no Occupation: Former Smoker Alcohol use-no Drug use-no Regular exercise-yes  Review of Systems      See HPI  Physical Exam  General:  Well-developed,well-nourished,in no acute distress; alert,appropriate and cooperative throughout examination Head:  Normocephalic and atraumatic without obvious abnormalities. No apparent alopecia or balding. Eyes:  No corneal or conjunctival inflammation noted. EOMI. Perrla. Funduscopic exam benign, without hemorrhages, exudates or papilledema. Vision grossly normal. Ears:  External ear exam shows no significant lesions or deformities.  Otoscopic examination reveals clear canals, tympanic membranes are intact bilaterally without bulging, retraction, inflammation or discharge. Hearing is grossly normal bilaterally. Nose:  External nasal examination shows no deformity or inflammation. Nasal mucosa are pink and moist without lesions or exudates. Mouth:  Oral mucosa and oropharynx without lesions or exudates.  Teeth in good repair. Neck:  No deformities, masses, or tenderness noted. Chest Wall:  No deformities, masses, or tenderness noted. Breasts:  No mass, nodules, thickening, tenderness, bulging, retraction, inflamation, nipple discharge or skin changes noted.   Lungs:  Normal respiratory effort, chest expands symmetrically. Lungs are clear to  auscultation, no crackles or wheezes. Heart:  Normal rate and regular rhythm. S1 and S2 normal without gallop, murmur, click, rub or other extra sounds. Abdomen:  Bowel sounds positive,abdomen soft and non-tender without masses, organomegaly or hernias noted. Msk:  No deformity or scoliosis noted of thoracic or lumbar spine.   Pulses:  R and L carotid,radial,femoral,dorsalis pedis and posterior tibial pulses are full and equal bilaterally Extremities:  No clubbing, cyanosis, edema, or deformity noted with normal full range of motion of all joints.   Neurologic:  No cranial nerve deficits noted. Station and gait are normal. Plantar reflexes are down-going bilaterally. DTRs are symmetrical throughout. Sensory, motor and coordinative functions appear intact. Skin:  total body skin exam normal except for a irritated lesion right cheek referred to [facility] dermatology Cervical Nodes:  No lymphadenopathy noted Axillary Nodes:  No palpable lymphadenopathy Inguinal Nodes:  No significant adenopathy Psych:  Cognition and judgment appear intact. Alert and cooperative with normal attention span and concentration. 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 One nightly  Problem # 4:  HYPERTENSION (ICD-401.9) Assessment: Improved  Her updated medication list for this problem includes:    Lisinopril-hydrochlorothiazide 20-12.5 Mg Tabs (Lisinopril-hydrochlorothiazide) ..... Half tab by mouth   once daily    Clonidine Hcl 0.1 Mg Tabs (Clonidine hcl) .Marland Kitchen... Take one tab once daily  Orders: EKG w/ Interpretation (93000)  Complete Medication  List: 1)  Lisinopril-hydrochlorothiazide 20-12.5 Mg Tabs (Lisinopril-hydrochlorothiazide) .... Half tab by mouth   once daily 2)  Synthroid 100 Mcg Tabs (Levothyroxine sodium) .Marland Kitchen.. 1 tab once daily 3)  Lyrica 100 Mg Caps (Pregabalin) .Marland Kitchen.. 1 tab four times a day 4)  Topiramate 25 Mg Tabs (Topiramate) .... 3 tabs at hs 5)  Zolpidem Tartrate 10 Mg Tabs (Zolpidem tartrate) .... Take one tab at bedtime as needed sleep 6)  Crestor 10 Mg Tabs (Rosuvastatin calcium) .... One nightly 7)  Clonidine Hcl 0.1 Mg Tabs (Clonidine hcl) .... Take one tab once daily 8)  Aspirin 81 Mg Chew (Aspirin) .... Once daily 9)  Alprazolam 0.5 Mg Tabs (Alprazolam) .... Take one tab as needed  Patient Instructions: 1)  called , dermatology, asked to be seen ASAP.  Unconcerned.  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 3)  It is important that you exercise regularly at least 20 minutes 5 times a week. If you develop chest pain, have severe difficulty breathing, or feel very tired , stop exercising immediately and seek medical attention. 4)  Schedule your mammogram. 5)  Schedule a colonoscopy/sigmoidoscopy to help detect colon cancer. 6)  Take calcium +Vitamin D daily. 7)  Take an Aspirin every day. Prescriptions: CRESTOR 10 MG TABS (ROSUVASTATIN CALCIUM) one nightly  #100 x 3   Entered and Authorized by:   Roderick Pee MD   Signed by:   Roderick Pee MD on 04/27/2009   Method used:   Print then Give to Patient   RxID:  [id number] ZOLPIDEM TARTRATE 10 MG TABS (ZOLPIDEM TARTRATE) take one tab at bedtime as needed sleep  #100 x 3   Entered and Authorized by:   Roderick Pee MD   Signed by:   Roderick Pee MD on 04/27/2009   Method used:   Print then Give to Patient   RxID:   [id number] SYNTHROID 100 MCG  TABS (LEVOTHYROXINE SODIUM) 1 tab once daily  #100 x 3   Entered and Authorized by:   Roderick Pee MD   Signed by:    Roderick Pee MD on 04/27/2009   Method used:   Print then Give to Patient   RxID:   [id number] LISINOPRIL-HYDROCHLOROTHIAZIDE 20-12.5 MG  TABS (LISINOPRIL-HYDROCHLOROTHIAZIDE) half tab by mouth   once daily  #100 x 3   Entered and Authorized by:   Roderick Pee MD   Signed by:   Roderick Pee MD on 04/27/2009   Method used:   Print then Give to Patient   RxID:   [id number]

## 2010-04-05 NOTE — Progress Notes (Signed)
Summary: LMTCB 11/16, New med to replace Simvastatin  Phone Note Call from Patient Call back at Home Phone [phone]   Caller: Patient Call For: Roderick Pee MD Summary of Call: Pt on Simvastatin for a few years, began experiencing discomfort, weakness in thigh, muscle pain.  Pt instructed to D/C med and see if symptoms resolve.  She reports all these symptoms have gone away.  Requesting new Rx to take in place of Simvastatin.  Pt aware Dr Tawanna Cooler out of office until Monday 11/16.  [facility] mail order, 90 day supply with refills  Initial call taken by: Sid Falcon LPN,  January 15, 2008 1:01 PM  Follow-up for Phone Call        may try Lipitor 10 mg nightly, dispense 100 tablets directions one nightly 3 refills fasting lipid and liver panel in 4 weeks.  Cautioned patient this may have the same side effect profile is as his Zocor.  If it does stop the medicine and call for consult with me Follow-up by: Roderick Pee MD,  January 20, 2008 8:17 AM  Additional Follow-up for Phone Call Additional follow up Details #1::        Rx printed, signed and ready for pick-up.  LMTCB.  Per Dr Tawanna Cooler, explain to pt she will need to explain to [facility] she cannot take Zocor or they will not pay for the Lipitor. Sid Falcon LPN  January 20, 2008 8:41 AM     Additional Follow-up for Phone Call Additional follow up Details #2::    Pt requesting prescription to be mailed. Follow-up by: Lynann Beaver CMA,  January 20, 2008 1:56 PM  New/Updated Medications: LIPITOR 10 MG TABS (ATORVASTATIN CALCIUM) one tab at HS   Prescriptions: LIPITOR 10 MG TABS (ATORVASTATIN CALCIUM) one tab at HS  #100 x 3   Entered by:   Sid Falcon LPN   Authorized by:   Roderick Pee MD   Signed by:   Sid Falcon LPN on 14/78/2956   Method used:   Print then Give to Patient   RxID:   [id number]

## 2010-04-05 NOTE — Procedures (Signed)
Summary: Colonoscopy   Colonoscopy  Procedure date:  11/08/2007  Findings:      Location:  [facility].    Procedures Next Due Date:    Colonoscopy: 11/2012  Patient Name: Brandi Spence, Brandi Spence. MRN:  Procedure Procedures: Colonoscopy CPT: [phone].    with polypectomy. CPT: [id number].  Personnel: Endoscopist: Gaspare Netzel L. Juanda Chance, MD.  Exam Location: Exam performed in Outpatient Clinic. Outpatient  Patient Consent: Procedure, Alternatives, Risks and Benefits discussed, consent obtained, from patient. Consent was obtained by the RN.  Indications  Surveillance of: 2004.  Increased Risk Screening: For family history of colorectal neoplasia, in  parent  History  Current Medications: Patient is not currently taking Coumadin.  Pre-Exam Physical: Performed Nov 08, 2007. Cardio-pulmonary exam, Rectal exam, HEENT exam , Abdominal exam, Extremity exam, Neurological exam, Mental status exam WNL.  Comments: Pt. history reviewed/updated, physical exam performed prior to initiation of sedation?yes Exam Exam: Extent of exam reached: Cecum, extent intended: Cecum.  The cecum was identified by appendiceal orifice and IC valve. Duration of exam: time in 8:12 min, out 8:10 min minutes. Colon retroflexion performed. Images taken. ASA Classification: I. Tolerance: good.  Monitoring: Pulse and BP monitoring, Oximetry used. Supplemental O2 given.  Colon Prep Used Miralax for colon prep. Prep results: good.  Sedation Meds: Patient assessed and found to be appropriate for moderate (conscious) sedation. Fentanyl 100 mcg. given IV. Versed 10 mg. given IV.  Findings - POLYP: Cecum, Maximum size: 15 mm. sessile polyp. Distance from Anus 120 cm. Procedure:  snare with cautery, removed, retrieved, Polyp sent to pathology. ICD9: Colon Polyps: 211.3.  - NORMAL EXAM: Cecum.  POLYP: Sigmoid Colon, Maximum size: 3 mm. diminutive, sessile polyp. Distance from Anus 15 cm. Procedure:  biopsy  without cautery, The polyp was removed piece meal. removed, retrieved, sent to pathology. ICD9: Colon Polyps: 211.3.  - NORMAL EXAM: Rectum.   Assessment Abnormal examination, see findings above.  Diagnoses: 211.3: Colon Polyps.   Comments: 2 polyps removed from the right and the left colon Events  Unplanned Interventions: No intervention was required.  Unplanned Events: There were no complications. Plans Medication Plan: Await pathology.  Patient Education: Patient given standard instructions for: Patient instructed to get routine colonoscopy every 5 years.  Disposition: After procedure patient sent to recovery. After recovery patient sent home.    cc.   Tinnie Gens Todd,MD  REPORT OF SURGICAL PATHOLOGY   Case #: [id number] Patient Name: Brandi, Spence. Office Chart Number:  [id number]   MRN: [id number] Pathologist: H. Hollice Espy, MD DOB/Age  [birth year] (Age: 65)    Gender: F Date Taken:  11/08/2007 Date Received: 11/08/2007   FINAL DIAGNOSIS   ***MICROSCOPIC EXAMINATION AND DIAGNOSIS***   1.  COLON, CECUM, POLYP, BIOPSY:  TUBULAR ADENOMA.  NO HIGH GRADE DYSPLASIA OR MALIGNANCY IDENTIFIED (TWO FRAGMENTS).   2.  COLON, 15 CM, BIOPSY:  POLYPOID COLORECTAL MUCOSA.  NO ADENOMATOUS CHANGE OR MALIGNANCY IDENTIFIED (TWO FRAGMENTS)   COMMENT 2.  There is benign colorectal mucosa that has polypoid architecture.  No objective increase in inflammation is seen, and no hyperplastic or adenomatous changes are noted.  There is no evidence of malignancy.   kv Date Reported:  11/12/2007     H. Hollice Espy, MD *** Electronically Signed Out By [facility] ***     November 12, 2007 MRN: [id number]    [facility] [address] [place], [place]  [address]    Dear Ms. Hale,  I am pleased to inform you that the  colon polyp(s) removed during your recent colonoscopy was (were) found to be benign (no cancer detected) upon pathologic examination.The polyp was adenomatous  ( precancerous)  I recommend you have a repeat colonoscopy examination in 5_ years to look for recurrent polyps, as having colon polyps increases your risk for having recurrent polyps or even colon cancer in the future.  Should you develop new or worsening symptoms of abdominal pain, bowel habit changes or bleeding from the rectum or bowels, please schedule an evaluation with either your primary care physician or with me.  Additional information/recommendations:  _x_ No further action with gastroenterology is needed at this time. Please      follow-up with your primary care physician for your other healthcare      ne  Please call us if you are having persistent problems or have questions about your condition that have not been fully answered at this time.  Sincerely,  Hart Carwin MD  This letter has been electronically signed by your physician.   Signed by Hart Carwin MD on 11/12/2007 at 9:39 PM  ________________________________________________________________________ This report was created from the original endoscopy report, which was reviewed and signed by the above listed endoscopist.

## 2010-04-05 NOTE — Assessment & Plan Note (Signed)
Summary: BP check/dm  Nurse Visit   Vital Signs:  Patient Profile:   65 Years Old Female Height:     56 inches Weight:      193 pounds Temp:     98.7 degrees F oral BP sitting:   112 / 74  (left arm) Cuff size:   regular  Vitals Entered By: Kern Reap CMA (March 11, 2008 10:54 AM)                  Impression & Recommendations:  Problem # 1:  HYPERLIPIDEMIA (ICD-272.4) Assessment: Unchanged  The following medications were removed from the medication list:    Lipitor 10 Mg Tabs (Atorvastatin calcium) ..... One tab at bedtime   Problem # 2:  HYPERTENSION (ICD-401.9) Assessment: Unchanged  The following medications were removed from the medication list:    Clonidine Hcl 0.1 Mg Tabs (Clonidine hcl) .Marland Kitchen... 1 once daily  Her updated medication list for this problem includes:    Lisinopril-hydrochlorothiazide 20-12.5 Mg Tabs (Lisinopril-hydrochlorothiazide) ..... Half tab  once daily   Complete Medication List: 1)  Lisinopril-hydrochlorothiazide 20-12.5 Mg Tabs (Lisinopril-hydrochlorothiazide) .... Half tab  once daily 2)  Synthroid 100 Mcg Tabs (Levothyroxine sodium) .Marland Kitchen.. 1 tab once daily 3)  Lyrica 100 Mg Caps (Pregabalin) .Marland Kitchen.. 1 tab two times a day 4)  Topiramate 25 Mg Tabs (Topiramate) .... 3 tabs at hs 5)  Zolpidem Tartrate 10 Mg Tabs (Zolpidem tartrate) .... Take one tab at bedtime as needed sleep   Patient Instructions: 1)   continue to take only a half a tablet of lisinopril daily.  If you need to add back the clonidine for hot flashes, then take a quarter of a tablet of lisinopril daily. 2)  I will give you some samples of Crestor 10 mg take a half a tablet at bedtime.  Call me in 4 weeks for follow-up   Physical Exam  General:     Well-developed,well-nourished,in no acute distress; alert,appropriate and cooperative throughout examination  Prior Medications: SYNTHROID 100 MCG  TABS (LEVOTHYROXINE SODIUM) 1 tab once daily LYRICA 100 MG  CAPS  (PREGABALIN) 1 tab two times a day TOPIRAMATE 25 MG TABS (TOPIRAMATE) 3 tabs at HS ZOLPIDEM TARTRATE 10 MG TABS (ZOLPIDEM TARTRATE) take one tab at bedtime as needed sleep Current Allergies: No known allergies     Orders Added: 1)  Est. Patient Level III Elina.Jerry    ]  Chief Complaint:  follow up low blood pressure.  History of Present Illness: Brandi Spence is a 65 year old female, who comes in today for evaluation of high blood pressure.  She underwent a right parotid gland removal.  Fortunately, was benign.  During induction anesthesia.  Her blood pressure dropped.  Postop she had to be given a bolus of fluids.  She had taken her blood pressure medication that day.  She's had trouble in the past with low blood pressure during anesthesia.  She cut her lisinopril and half stopped her clonidine and BP is 120/80.  Now.  The clonidine.  She was taking for hot flushes fortunately they have not returned.  She also has hyperlipidemia, however, she has side effects from Zocor, and now, Lipitor, with muscle aches.  She would like to discuss other options.  Acute Visit History:      She denies abdominal pain, chest pain, constipation, cough, diarrhea, earache, eye symptoms, fever, genitourinary symptoms, headache, musculoskeletal symptoms, nasal discharge, nausea, rash, sinus problems, sore throat, and vomiting.  Past Surgical History:    Reviewed history from 09/19/2006 and no changes required:       CB x 2       BTL       CT syndrome-Lt       TAH/BSO       Morton;s Neuromutz       Colonoscopy-2004       right parotid gland removal 09

## 2010-04-05 NOTE — Progress Notes (Signed)
Summary: REFILL  Phone Note Call from Patient Call back at Home Phone [phone]   Caller: Patient Call For: DR TODD Reason for Call: Refill Medication Summary of Call: PT WAS SEEN ON 10/12/06 AND RECEIVED MAIL ORDER RX'S. ALL OF THE OTHER RX'S HAD 4 RF'S EXCEPT FOR SIMVASTATIN. PT  IS  REQUESTING 3 ADDITIONAL RF TO LAST UNTIL HER NEXT OV. PLEASE E-SCRIBE [facility] HER ADDITIONAL SIMVASTATIN RF. Initial call taken by: Warnell Forester,  November 06, 2006 1:14 PM  Follow-up for Phone Call        Phone Call Completed, Rx Called In, Provider Notified Follow-up by: Arcola Jansky, RN,  November 07, 2006 11:29 AM      Prescriptions: ZOCOR 20 MG  TABS (SIMVASTATIN) 1 at bedtime  #100 x 4   Entered by:   Arcola Jansky, RN   Authorized by:   Roderick Pee MD   Signed by:   Arcola Jansky, RN on 11/07/2006   Method used:   Print then Give to Patient   RxID:   [id number] ZOCOR 20 MG  TABS (SIMVASTATIN) 1 at bedtime  #100 x 4   Entered and Authorized by:   Arcola Jansky, RN   Signed by:   Arcola Jansky, RN on 11/07/2006   Method used:   Print then Give to Patient   RxID:   [id number]  first rx has wrong provider name ......>.................................................................Marland KitchenMarland KitchenArcola Jansky, RN  November 07, 2006 11:25 AM

## 2010-04-05 NOTE — Progress Notes (Signed)
Summary: refills  Phone Note Refill Request   Refills Requested: Medication #1:  LISINOPRIL-HYDROCHLOROTHIAZIDE 20-12.5 MG  TABS half tab  once daily  Medication #2:  SYNTHROID 100 MCG  TABS 1 tab once daily Initial call taken by: Kern Reap CMA (AAMA),  December 29, 2008 2:06 PM    Prescriptions: SYNTHROID 100 MCG  TABS (LEVOTHYROXINE SODIUM) 1 tab once daily  #100 x 0   Entered by:   Kern Reap CMA (AAMA)   Authorized by:   Roderick Pee MD   Signed by:   Kern Reap CMA (AAMA) on 12/29/2008   Method used:   Electronically to        [facility]* (mail-order)             ,          Ph: [phone]       Fax: [phone]   RxID:   [id number] LISINOPRIL-HYDROCHLOROTHIAZIDE 20-12.5 MG  TABS (LISINOPRIL-HYDROCHLOROTHIAZIDE) half tab  once daily  #90 x 0   Entered by:   Kern Reap CMA (AAMA)   Authorized by:   Roderick Pee MD   Signed by:   Kern Reap CMA (AAMA) on 12/29/2008   Method used:   Electronically to        [facility]* (mail-order)             ,          Ph: [phone]       Fax: [phone]   RxID:   [id number]

## 2010-04-05 NOTE — Progress Notes (Signed)
Summary: generic ambien approved for 1 year   Phone Note From Other Clinic   Caller: [employer] Call For: Tawanna Cooler Summary of Call: ambien CR generic approved for 1 year till October 19 2008  Initial call taken by: Roselle Locus,  January 02, 2008 8:57 AM

## 2010-04-05 NOTE — Progress Notes (Signed)
Summary: Pt has ov on 04/27/09. Pt req partial refill of [facility]   Phone Note Call from Patient Call back at [facility] Phone [phone]   Caller: Patient Summary of Call: Pt has an appt sch for 04/27/09 with Dr. Tawanna Cooler. Pt is needing a partial refill of Synthroid sent to [facility] [address] Initial call taken by: Lucy Antigua,  April 09, 2009 9:23 AM    Prescriptions: SYNTHROID 100 MCG  TABS (LEVOTHYROXINE SODIUM) 1 tab once daily  #30 x 0   Entered by:   Kern Reap CMA (AAMA)   Authorized by:   Roderick Pee MD   Signed by:   Kern Reap CMA (AAMA) on 04/09/2009   Method used:   Electronically to        [id number] (retail)       [address]       [place], [place]  [address]       Ph: [phone] or [phone]       Fax: [phone]   RxID:   [id number]

## 2010-04-05 NOTE — Progress Notes (Signed)
Summary: crestor rx  Phone Note Call from Patient Call back at [phone]   Caller: live Call For: Brandi Spence Summary of Call: The Crestor 10 mg sample since 1-6 is working fine.  Needs 90 day Rx with refills.  Could you please mail it to her at [address], [place] [address]?  Initial call taken by: Rudy Jew, RN,  April 24, 2008 9:46 AM  Follow-up for Phone Call        ok #100 directions one nightly, refills x 3 Follow-up by: Roderick Pee MD,  April 24, 2008 10:21 AM  Additional Follow-up for Phone Call Additional follow up Details #1::        Rx to Dr. [name] Berger for signature & mailing. Additional Follow-up by: Rudy Jew, RN,  April 24, 2008 1:33 PM    New/Updated Medications: CRESTOR 10 MG TABS (ROSUVASTATIN CALCIUM) one nightly   Prescriptions: CRESTOR 10 MG TABS (ROSUVASTATIN CALCIUM) one nightly  #100 x 3   Entered by:   Rudy Jew, RN   Authorized by:   Roderick Pee MD   Signed by:   Rudy Jew, RN on 04/24/2008   Method used:   Print then Mail to Patient   RxID:   [id number]

## 2010-04-05 NOTE — Progress Notes (Signed)
Summary: lisinopril-hct  Phone Note From Pharmacy   Caller: [phone] Call For: Brandi Spence  Summary of Call: Ref [id number] confirming directions of 1/2 daily.  They will give 90 day supply and mark Rx Needs physical ov.   Initial call taken by: Rudy Jew, RN,  January 01, 2009 10:01 AM

## 2010-04-05 NOTE — Assessment & Plan Note (Signed)
Summary: zostavax only, no pay required/mae  Nurse Visit    Prior Medications: LISINOPRIL-HYDROCHLOROTHIAZIDE 20-12.5 MG  TABS (LISINOPRIL-HYDROCHLOROTHIAZIDE) 1 once daily CLONIDINE HCL 0.1 MG  TABS (CLONIDINE HCL) 1 once daily ZOCOR 20 MG  TABS (SIMVASTATIN) 1 at bedtime SYNTHROID 100 MCG  TABS (LEVOTHYROXINE SODIUM) 1 tab once daily LYRICA 100 MG  CAPS (PREGABALIN) 1 tab two times a day AMBIEN 10 MG  TABS (ZOLPIDEM TARTRATE) as needed hs Current Allergies: No known allergies    Zostavax # 1    Vaccine Type: Zostavax    Site: right deltoid    Mfr: Merck    Dose: 0.65 ml    Route: Clifford    Given by: Arcola Jansky, RN    Exp. Date: 06/07/2008    Lot #: [id number] order given per Dr Tawanna Cooler  Orders Added: 1)  Zoster (Shingles) Vaccine Live [90736] 2)  Admin 1st Vaccine [email or web address].Peer    ]

## 2010-04-05 NOTE — Progress Notes (Signed)
  Phone Note Call from Patient   Details for Reason: ambien Summary of Call: patient states that her rx for Remus Loffler should be 90 with 3 refills Initial call taken by: Kern Reap CMA,  December 23, 2007 3:29 PM  Follow-up for Phone Call        rx given to patient  Follow-up by: Kern Reap CMA,  December 23, 2007 4:15 PM      Prescriptions: ZOLPIDEM TARTRATE 10 MG TABS (ZOLPIDEM TARTRATE) take one tab at bedtime as needed sleep  #90 x 3   Entered by:   Kern Reap CMA   Authorized by:   Roderick Pee MD   Signed by:   Kern Reap CMA on 12/23/2007   Method used:   Print then Give to Patient   RxID:   [id number]

## 2010-04-05 NOTE — Letter (Signed)
Summary: Liability form  Liability form   Imported By: Kassie Mends 02/04/2007 10:02:18  _____________________________________________________________________  External Attachment:    Type:   Image     Comment:   Liability form

## 2010-04-05 NOTE — Progress Notes (Signed)
Summary: Medication questions  Phone Note Call from Patient   Caller: Patient Call For: Dr. Tawanna Cooler Summary of Call: Lipitor is causing muscle pain and cramping. Parotidectomy on 03/02/2008, and BP was low before, during and after surgery. BP before procedure 100/62 1 hr later 97/59 During surgery 120/60--80/45 Post op 100/60---90/45 Recovery 86/42---60/42 This morning:  60/42 Bolus of IVs given After Bolus:  91/55 Reduce any meds for BP? [phone]  Please call. Initial call taken by: Lynann Beaver CMA,  March 03, 2008 11:37 AM  Follow-up for Phone Call        Pt notified of Dr. Nelida Meuse recommendations, and cannot come for an office visit until 03/11/2008.  Appt. scheduled. Follow-up by: Lynann Beaver CMA,  March 03, 2008 12:50 PM    Stop clonidine, cut, lisinopril in half.  Check her blood pressure twice a day.  See me next Monday for follow-up.  Stop Lipitor too Per Dr. Tawanna Cooler

## 2010-04-05 NOTE — Miscellaneous (Signed)
Summary: GI PV  Clinical Lists Changes  Medications: Added new medication of METOCLOPRAMIDE HCL 10 MG  TABS (METOCLOPRAMIDE HCL) As per prep instructions. - Signed Added new medication of MIRALAX   POWD (POLYETHYLENE GLYCOL 3350) As per prep  instructions. - Signed Rx of METOCLOPRAMIDE HCL 10 MG  TABS (METOCLOPRAMIDE HCL) As per prep instructions.;  #2 x 0;  Signed;  Entered by: Barton Fanny RN;  Authorized by: Hart Carwin MD;  Method used: Electronically to [facility] [address]  [facility] [address] [place], [place], [place]  [address], Ph: [phone] or [phone], Fax: [phone] Rx of MIRALAX   POWD (POLYETHYLENE GLYCOL 3350) As per prep  instructions.;  #255gm x 0;  Signed;  Entered by: Barton Fanny RN;  Authorized by: Hart Carwin MD;  Method used: Electronically to [facility]  [address]  [facility] [address], [place], [place], [place]  [address], Ph: [phone] or [phone], Fax: [phone] Observations: Added new observation of NKA: T (11/01/2007 17:02)    Prescriptions: MIRALAX   POWD (POLYETHYLENE GLYCOL 3350) As per prep  instructions.  #255gm x 0   Entered by:   Barton Fanny RN   Authorized by:   Hart Carwin MD   Signed by:   Barton Fanny RN on 11/01/2007   Method used:   [facility]ronically to        [facility] [address]  [facility]       [address]       [place], [place]  [address]       Ph: [phone] or [phone]       Fax: [phone]   RxID:   [id number] METOCLOPRAMIDE HCL 10 MG  TABS (METOCLOPRAMIDE HCL) As per prep instructions.  #2 x 0   Entered by:   Barton Fanny RN   Authorized by:   Hart Carwin MD   Signed by:   Barton Fanny RN on 11/01/2007   Method used:   [facility]ronically to        [facility] [address]  [facility]       [address]       [place], [place]  [address]       Ph: [phone] or [phone]       Fax: [phone]   RxID:    [id number]

## 2010-05-11 ENCOUNTER — Telehealth: Payer: Self-pay | Admitting: Family Medicine

## 2010-05-11 MED ORDER — LEVOTHYROXINE SODIUM 100 MCG PO TABS: 100.0000 ug | ORAL_TABLET | Freq: Every day | ORAL | Status: DC

## 2010-05-11 NOTE — Telephone Encounter (Signed)
Triage vm---refill levothyroxine for a 90 day supply, sent to [facility]. Has appt  Next month

## 2010-06-20 ENCOUNTER — Other Ambulatory Visit (INDEPENDENT_AMBULATORY_CARE_PROVIDER_SITE_OTHER): Payer: BLUE CROSS/BLUE SHIELD | Admitting: Family Medicine

## 2010-06-20 ENCOUNTER — Other Ambulatory Visit (INDEPENDENT_AMBULATORY_CARE_PROVIDER_SITE_OTHER): Payer: BLUE CROSS/BLUE SHIELD

## 2010-06-20 ENCOUNTER — Other Ambulatory Visit: Payer: Self-pay | Admitting: Family Medicine

## 2010-06-20 DIAGNOSIS — Z0000 Encounter for general adult medical examination without abnormal findings: Secondary | ICD-10-CM

## 2010-06-20 LAB — URINALYSIS, ROUTINE W REFLEX MICROSCOPIC
Bilirubin Urine: NEGATIVE
Hgb urine dipstick: NEGATIVE
Ketones, ur: NEGATIVE
Nitrite: NEGATIVE
Specific Gravity, Urine: 1.025 (ref 1.000–1.030)
Total Protein, Urine: NEGATIVE
Urine Glucose: NEGATIVE
Urobilinogen, UA: 0.2 (ref 0.0–1.0)
pH: 5.5 (ref 5.0–8.0)

## 2010-06-20 LAB — HEPATIC FUNCTION PANEL
ALT: 27 U/L (ref 0–35)
AST: 24 U/L (ref 0–37)
Albumin: 3.9 g/dL (ref 3.5–5.2)
Alkaline Phosphatase: 51 U/L (ref 39–117)
Bilirubin, Direct: 0.1 mg/dL (ref 0.0–0.3)
Total Bilirubin: 0.6 mg/dL (ref 0.3–1.2)
Total Protein: 6.3 g/dL (ref 6.0–8.3)

## 2010-06-20 LAB — LIPID PANEL
Cholesterol: 176 mg/dL (ref 0–200)
HDL: 50.1 mg/dL (ref >39.00)
LDL Cholesterol: 90 mg/dL (ref 0–99)
Total CHOL/HDL Ratio: 4
Triglycerides: 180 mg/dL — ABNORMAL HIGH (ref 0.0–149.0)
VLDL: 36 mg/dL (ref 0.0–40.0)

## 2010-06-20 LAB — BASIC METABOLIC PANEL
BUN: 17 mg/dL (ref 6–23)
CO2: 29 mEq/L (ref 19–32)
Calcium: 9.4 mg/dL (ref 8.4–10.5)
Chloride: 104 mEq/L (ref 96–112)
Creatinine, Ser: 0.8 mg/dL (ref 0.4–1.2)
GFR: 74.28 mL/min (ref >60.00)
Glucose, Bld: 103 mg/dL — ABNORMAL HIGH (ref 70–99)
Potassium: 4.5 mEq/L (ref 3.5–5.1)
Sodium: 139 mEq/L (ref 135–145)

## 2010-06-20 LAB — CK: Total CK: 38 U/L (ref 7–177)

## 2010-06-20 LAB — TSH: TSH: 0.36 u[IU]/mL (ref 0.35–5.50)

## 2010-06-27 ENCOUNTER — Encounter: Payer: Self-pay | Admitting: Family Medicine

## 2010-06-27 ENCOUNTER — Ambulatory Visit (INDEPENDENT_AMBULATORY_CARE_PROVIDER_SITE_OTHER): Payer: BLUE CROSS/BLUE SHIELD | Admitting: Family Medicine

## 2010-06-27 DIAGNOSIS — Z23 Encounter for immunization: Secondary | ICD-10-CM

## 2010-06-27 DIAGNOSIS — Z0000 Encounter for general adult medical examination without abnormal findings: Secondary | ICD-10-CM

## 2010-06-27 DIAGNOSIS — I10 Essential (primary) hypertension: Secondary | ICD-10-CM

## 2010-06-27 DIAGNOSIS — Z136 Encounter for screening for cardiovascular disorders: Secondary | ICD-10-CM

## 2010-06-27 DIAGNOSIS — E785 Hyperlipidemia, unspecified: Secondary | ICD-10-CM

## 2010-06-27 DIAGNOSIS — E042 Nontoxic multinodular goiter: Secondary | ICD-10-CM

## 2010-06-27 MED ORDER — LEVOTHYROXINE SODIUM 100 MCG PO TABS: 100.0000 ug | ORAL_TABLET | Freq: Every day | ORAL | Status: DC

## 2010-06-27 MED ORDER — LISINOPRIL-HYDROCHLOROTHIAZIDE 20-25 MG PO TABS: 1.0000 | ORAL_TABLET | Freq: Every day | ORAL | Status: DC

## 2010-06-27 MED ORDER — ROSUVASTATIN CALCIUM 10 MG PO TABS: 10.0000 mg | ORAL_TABLET | Freq: Every day | ORAL | Status: DC

## 2010-06-27 NOTE — Patient Instructions (Signed)
Continue current medications.  Follow-up in one year, sooner if any problems 

## 2010-06-27 NOTE — Progress Notes (Signed)
  Subjective:    Patient ID: Brandi Spence, female    DOB: [birth year], 65 y.o.   MRN: [id number]  HPIVictoria is a 65 year old female, nonsmoker, who comes in today for evaluation of hypertension, and hyperlipidemia.  She currently takes lisinopril 20 -- 25 dose one half tab daily BP 110/70.  She takes Crestor 10 mg nightly for hyperlipidemia.  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  She continues to see her orthopedist and her neurologist.  She has a history of cervical disk disease and a neuropathy.  They have run.  There are 100 mg b.i.d., Ambien, 10 mg nightly, clonidine, .1 daily, Xanax, .5 daily, Elavil, 25 nightly, and  metnx 25 mg b.i.d.  Tetanus status two 2008.  She said her shingles.  Vaccine.  Mammogram last week.  Colonoscopy normal.  She's had her uterus and ovaries removed for nonmalignant reasons.  However, she went for a pelvic exam last week.  Advise she no longer needs to do that.  We will give her a Pneumovax today.  She brings in an MRI of her neck also the report shows the cyst that we previously seen in the thyroid gland.  This is the previously evaluated.  She gets routine eye care, dental care, BSE, annual mammography, colonoscopy, vaccinations as above.  There are no guns in the house.  She does have a living will and health-care power-of-attorney,     Review of Systems  Constitutional: Negative.   HENT: Negative.   Eyes: Negative.   Respiratory: Negative.   Cardiovascular: Negative.   Gastrointestinal: Negative.   Genitourinary: Negative.   Musculoskeletal: Negative.   Neurological: Negative.   Hematological: Negative.   Psychiatric/Behavioral: Negative.        Objective:   Physical Exam  Constitutional: She appears well-developed and well-nourished.  HENT:  Head: Normocephalic and atraumatic.  Right Ear: External ear normal.  Left Ear: External ear normal.  Nose: Nose normal.  Mouth/Throat: Oropharynx is clear  and moist.  Eyes: EOM are normal. Pupils are equal, round, and reactive to light.  Neck: Normal range of motion. Neck supple. Thyromegaly present.       Thyroid nodule, left lower lobe, unchanged since previous exam  Cardiovascular: Normal rate, regular rhythm, normal heart sounds and intact distal pulses.  Exam reveals no gallop and no friction rub.   No murmur heard. Pulmonary/Chest: Effort normal and breath sounds normal.  Abdominal: Soft. Bowel sounds are normal. She exhibits no distension and no mass. There is no tenderness. There is no rebound.  Genitourinary:       Bilateral breast exam normal except for scars from previous reduction mammoplasty  Musculoskeletal: Normal range of motion.  Lymphadenopathy:    She has no cervical adenopathy.  Neurological: She is alert. She has normal reflexes. No cranial nerve deficit. She exhibits normal muscle tone. Coordination normal.  Skin: Skin is warm and dry.  Psychiatric: She has a normal mood and affect. Her behavior is normal. Judgment and thought content normal.          Assessment & Plan:  Hypertension continue lisinopril.  Hyperlipidemia.  Continue Crestor 10 mg nightly  Thyroid nodule.  Continue Synthroid 100 mcg daily.  Continue follow-up with orthopedist, neurology, and the rheumatologist.  Pneumovax today.

## 2010-06-29 ENCOUNTER — Telehealth: Payer: Self-pay | Admitting: *Deleted

## 2010-06-29 MED ORDER — LISINOPRIL-HYDROCHLOROTHIAZIDE 20-25 MG PO TABS: ORAL_TABLET | ORAL | Status: DC

## 2010-06-29 NOTE — Telephone Encounter (Signed)
rx clairifications

## 2010-07-22 NOTE — Op Note (Signed)
NAME:  Brandi Spence, Brandi Spence                       ACCOUNT NO.:  [id number]   MEDICAL RECORD NO.:  [id number]                   PATIENT TYPE:  INP   LOCATION:  9399                                 FACILITY:  [facility]   PHYSICIAN:  Malva Limes, M.D.                 DATE OF BIRTH:  1945-06-07   DATE OF PROCEDURE:  08/20/2003  DATE OF DISCHARGE:                                 OPERATIVE REPORT   PREOPERATIVE DIAGNOSIS:  Symptomatic uterine fibroids.   POSTOPERATIVE DIAGNOSIS:  Symptomatic uterine fibroids.   PROCEDURE:  Total abdominal hysterectomy with bilateral salpingo-  oophorectomy.   SURGEON:  Malva Limes, M.D.   ASSISTANT:  Luvenia Redden, M.D.   ANESTHESIA:  General endotracheal anesthesia.   ANTIBIOTICS:  Ancef 1 g.   ESTIMATED BLOOD LOSS:  200 mL.   COMPLICATIONS:  None.   SPECIMENS:  Cervix, uterus, fallopian tubes and ovaries sent to pathology.   DRAINS:  Foley to bedside drainage.   FINDINGS:  The patient had normal liver and gallbladder. Kidneys were normal  bilaterally.  There is no pelvic or periaortic lymphadenopathy.  The bowel  appeared to be normal.  The patient had multiple uterine fibroids.  Ovaries  were normal and small bilaterally.  The patient had evidence of past tubal  ligation.   DESCRIPTION OF PROCEDURE:  The patient was taken to the operating room where  general anesthesia was administered without complications. She was placed in  dorsal supine position.  She was prepped with Hibiclens and a Foley catheter  was placed.  She was draped in the usual fashion for this procedure.  A  Pfannenstiel incision was made.  This was carried down to the fascia.  The  fascia was entered in the midline and extended laterally with the Mayo  scissors.  The rectus muscles were dissected from the fascia with a Bovie.  Rectus muscles were divided in the midline and taken superiorly and  inferiorly.  Parietal peritoneum was entered sharply and taken superior and  inferiorly.  The patient was placed in Trendelenburg position and  examination of the abdominal and pelvic contents was undertaken.  An  O'Connor-O'Sullivan retractor was the placed and three laps used to pack the  bowel away.  At this point, two Kelly clamps were used to grab the uterus.  The ureters were identified and felt to be out of the operative field.  The  round ligament on the left was then ligated with 0 Monocryl suture,  transected with the Bovie and the anterior and posterior leaf of the broad  ligament opened.  The infundibulopelvic ligament was then isolated, clamped,  cut and ligated x2 with 0 Monocryl suture.  The bladder flap was then taken  down sharply.  A similar procedure was performed on the opposite side.  Following this, the uterine vessels were bilaterally clamped, cut and  ligated with 0 Monocryl suture.  The cardinal ligaments were  then serially  clamped, cut and ligated with 0 Monocryl suture.  Once the level of the  external os was reached, the vagina was entered anteriorly and scissors used  to remove the cervix from the vagina.  At this point, both angles were  closed using 0 Monocryl suture in a Heaney fashion.  The remaining vaginal  cuff was closed using interrupted 0 Monocryl suture in figure-of-eight  fashion.  Several small bleeders on the perineum were then cauterized with  the Bovie.  Irrigation was performed and hemostasis found to be adequate.  At this point, the retractor was removed and the laps removed.  The parietal  peritoneum and rectus muscles were then reapproximated in the midline using  2-0 Vicryl in a running fashion.  The rectus fascia was then closed using 0  Monocryl suture in a running fashion.  Subcuticular tissue was made  hemostatic with the Bovie.  Stainless steel clips were used to close the  skin. The patient tolerated the procedure well.  The uterus weighed 298  pounds.  The patient was taken to the recovery room in stable  condition.  Instrument and lap counts correct x2.                                               Malva Limes, M.D.    MA/MEDQ  D:  08/20/2003  T:  08/20/2003  Job:  [id number]

## 2010-07-22 NOTE — Discharge Summary (Signed)
NAME:  Brandi Spence, Brandi Spence                       ACCOUNT NO.:  [id number]   MEDICAL RECORD NO.:  [id number]                   PATIENT TYPE:  INP   LOCATION:  9317                                 FACILITY:  [facility]   PHYSICIAN:  Malva Limes, M.D.                 DATE OF BIRTH:  January 28, 1946   DATE OF ADMISSION:  08/20/2003  DATE OF DISCHARGE:  08/22/2003                                 DISCHARGE SUMMARY   PRINCIPAL DISCHARGE DIAGNOSIS:  Symptomatic uterine fibroids.   PRINCIPAL PROCEDURE:  Total abdominal hysterectomy with bilateral salpingo-  oophorectomy.   HISTORY OF PRESENT ILLNESS:  Brandi Spence is a 65 year old white female  gravida 4, para 2-0-2-2 who presented to [facility] for  total abdominal hysterectomy with bilateral salpingo-oophorectomy secondary  to a long history of irregular menstrual cycles plus vaginal spotting and 14  week uterine fibroids. A complete description of the events which led up to  this procedure can be found in the dictated history and physical. The  patient underwent a total abdominal hysterectomy with bilateral salpingo-  oophorectomy on August 20, 2003. The estimated blood loss was 200 cc. A  complete description of this procedure can be found in the dictated  operative note. The patient's postoperative course was uncomplicated. On  postoperative day 2, the patient was ambulating without difficulty. She did  have flatus. She was eating a regular diet. The patient's postoperative  hemoglobin was 11.1. The patient had Lovenox for deep vein thrombosis  prophylaxis in the postoperative period.   DISPOSITION:  The patient was discharged to home on postoperative day 2.   DISCHARGE MEDICATIONS:  She was given Tylox to take p.r.n.   FOLLOW UP:  She will return to the office in 4 weeks.                                               Malva Limes, M.D.    MA/MEDQ  D:  08/22/2003  T:  08/23/2003  Job:  [id number]

## 2010-07-22 NOTE — H&P (Signed)
NAME:  Brandi Spence, Brandi Spence                       ACCOUNT NO.:  [id number]   MEDICAL RECORD NO.:  [id number]                   PATIENT TYPE:  AMB   LOCATION:  SDC                                  [facility]ILITY:  [facility]   PHYSICIAN:  Malva Limes, M.D.                 DATE OF BIRTH:  Sep 25, 1945   DATE OF ADMISSION:  DATE OF DISCHARGE:                                HISTORY & PHYSICAL   HISTORY OF PRESENT ILLNESS:  Brandi Spence is a 65 year old white female G4 P2-  0-2-2 who presents today for total abdominal hysterectomy with bilateral  salpingo-oophorectomy secondary to a long history of irregular menstrual  cycles and uterine fibroids.  The patient has had difficulty with her  menstrual cycles for 20 years.  In 1985 the patient had D&C.  Also in 1995  the patient had hysteroscopy with D&C.  At that time the patient had 10-week  uterine fibroids.  No evidence of a submucous fibroid was identified.  The  patient then went through menopause and because of significant menopausal  symptoms was placed on estrogen replacement therapy.  The patient required  multiple changes with her medication to prevent breakthrough bleeding.  The  patient also had several endometrial biopsies which revealed no pathology.  Most recently, the patient again developed some vaginal bleeding off of  estrogen therapy and sonohysterogram was performed which revealed a large  submucous fibroid.  The patient at this time has 14-week-size fibroids.  An  FSH was obtained which was 45 and estradiol of 15.  The patient was given  the option of hysteroscopic resection of the myoma or total abdominal  hysterectomy.  In light of the prolonged problems of the patient and the  possibility of recurrence, the patient elected to have a total abdominal  hysterectomy and bilateral salpingo-oophorectomy.  I agreed with her  decision.   PAST MEDICAL HISTORY:  No known drug allergies.  She has a history of two  vaginal births without  difficulty, two D&C's for miscarriages.  She also has  a history of a kidney stone, tubal ligation, two foot surgeries, and the  D&C's as listed above.  The patient has also had cryotherapy of the cervix  for cervical dysplasia.   CURRENT MEDICATIONS:  1. Lisinopril/hydrochlorothiazide 20/25.  2. Calcium.  3. Ambien p.r.n.   FAMILY HISTORY:  Significant for diabetes and carcinoma of the breast in her  mother.   PHYSICAL EXAMINATION:  GENERAL:  The patient is a well-developed, well-  nourished white female in no apparent distress.  HEENT:  Within normal limits.  LUNGS:  Clear to auscultation.  CARDIOVASCULAR:  Reveals a regular rate and rhythm without a murmur.  BREASTS:  Without masses or tenderness.  There is no lymphadenopathy.  ABDOMEN:  Soft, nontender, nondistended.  There is no organomegaly.  There  is no rebound or guarding.  EXTREMITIES:  Within normal limits.  PELVIC:  Reveals normal  external genitalia.  The vagina is without lesions  or discharge.  The cervix is parous.  The patient has a 14-week-size uterus  with multiple uterine fibroids.  There are no adnexal masses.  RECTAL:  Within normal limits.   IMPRESSION:  Symptomatic uterine fibroids.   PLAN:  Proceed with total abdominal hysterectomy and bilateral salpingo-  oophorectomy.                                               Malva Limes, M.D.    MA/MEDQ  D:  08/19/2003  T:  08/19/2003  Job:  [id number]

## 2010-09-01 ENCOUNTER — Telehealth: Payer: Self-pay | Admitting: Family Medicine

## 2010-09-01 DIAGNOSIS — Z0000 Encounter for general adult medical examination without abnormal findings: Secondary | ICD-10-CM

## 2010-09-01 NOTE — Telephone Encounter (Signed)
Patient stated that she received a reminder from [facility], in 2010 stating that it is time for another one. Please order. Thanks.

## 2010-09-01 NOTE — Telephone Encounter (Signed)
(  Order placed please schedule)

## 2010-12-05 ENCOUNTER — Ambulatory Visit (HOSPITAL_COMMUNITY)
Admission: RE | Admit: 2010-12-05 | Discharge: 2010-12-05 | Disposition: A | Discharge status: Home / Self Care | Payer: Federal, State, Local not specified - PPO | Source: Ambulatory Visit | Attending: Anesthesiology | Admitting: Anesthesiology

## 2010-12-05 ENCOUNTER — Other Ambulatory Visit (HOSPITAL_COMMUNITY): Payer: Self-pay | Admitting: Anesthesiology

## 2010-12-05 DIAGNOSIS — R52 Pain, unspecified: Secondary | ICD-10-CM

## 2010-12-05 DIAGNOSIS — IMO0002 Reserved for concepts with insufficient information to code with codable children: Secondary | ICD-10-CM | POA: Insufficient documentation

## 2010-12-05 DIAGNOSIS — Z09 Encounter for follow-up examination after completed treatment for conditions other than malignant neoplasm: Secondary | ICD-10-CM | POA: Insufficient documentation

## 2011-03-07 HISTORY — PX: BUNIONECTOMY: SHX129

## 2011-07-03 ENCOUNTER — Other Ambulatory Visit: Payer: Self-pay | Admitting: Family Medicine

## 2011-08-30 ENCOUNTER — Other Ambulatory Visit: Payer: Self-pay | Admitting: Family Medicine

## 2011-09-14 ENCOUNTER — Other Ambulatory Visit: Payer: Self-pay | Admitting: Obstetrics and Gynecology

## 2011-09-14 DIAGNOSIS — R928 Other abnormal and inconclusive findings on diagnostic imaging of breast: Secondary | ICD-10-CM

## 2011-09-19 ENCOUNTER — Ambulatory Visit
Admission: RE | Admit: 2011-09-19 | Discharge: 2011-09-19 | Disposition: A | Discharge status: Home / Self Care | Payer: Federal, State, Local not specified - PPO | Source: Ambulatory Visit | Attending: Obstetrics and Gynecology | Admitting: Obstetrics and Gynecology

## 2011-09-19 DIAGNOSIS — R928 Other abnormal and inconclusive findings on diagnostic imaging of breast: Secondary | ICD-10-CM

## 2011-10-02 ENCOUNTER — Other Ambulatory Visit: Payer: Self-pay | Admitting: Endocrinology

## 2011-10-02 DIAGNOSIS — E049 Nontoxic goiter, unspecified: Secondary | ICD-10-CM

## 2011-10-03 ENCOUNTER — Ambulatory Visit
Admission: RE | Admit: 2011-10-03 | Discharge: 2011-10-03 | Disposition: A | Discharge status: Home / Self Care | Payer: Federal, State, Local not specified - PPO | Source: Ambulatory Visit | Attending: Endocrinology | Admitting: Endocrinology

## 2011-10-03 DIAGNOSIS — E049 Nontoxic goiter, unspecified: Secondary | ICD-10-CM

## 2011-10-10 ENCOUNTER — Other Ambulatory Visit: Payer: Self-pay | Admitting: Endocrinology

## 2011-10-10 DIAGNOSIS — E041 Nontoxic single thyroid nodule: Secondary | ICD-10-CM

## 2011-11-02 ENCOUNTER — Other Ambulatory Visit (INDEPENDENT_AMBULATORY_CARE_PROVIDER_SITE_OTHER): Payer: Federal, State, Local not specified - PPO

## 2011-11-02 DIAGNOSIS — Z0000 Encounter for general adult medical examination without abnormal findings: Secondary | ICD-10-CM

## 2011-11-02 LAB — CBC WITH DIFFERENTIAL/PLATELET
Basophils Absolute: 0 10*3/uL (ref 0.0–0.1)
Basophils Relative: 0.4 % (ref 0.0–3.0)
Eosinophils Absolute: 0.1 10*3/uL (ref 0.0–0.7)
Eosinophils Relative: 3.1 % (ref 0.0–5.0)
HCT: 38.9 % (ref 36.0–46.0)
Hemoglobin: 12.7 g/dL (ref 12.0–15.0)
Lymphocytes Relative: 41.6 % (ref 12.0–46.0)
Lymphs Abs: 2 10*3/uL (ref 0.7–4.0)
MCHC: 32.7 g/dL (ref 30.0–36.0)
MCV: 91.8 fl (ref 78.0–100.0)
Monocytes Absolute: 0.5 10*3/uL (ref 0.1–1.0)
Monocytes Relative: 10.4 % (ref 3.0–12.0)
Neutro Abs: 2.2 10*3/uL (ref 1.4–7.7)
Neutrophils Relative %: 44.5 % (ref 43.0–77.0)
Platelets: 236 10*3/uL (ref 150.0–400.0)
RBC: 4.24 Mil/uL (ref 3.87–5.11)
RDW: 13.3 % (ref 11.5–14.6)
WBC: 4.9 10*3/uL (ref 4.5–10.5)

## 2011-11-02 LAB — POCT URINALYSIS DIPSTICK
Bilirubin, UA: NEGATIVE
Blood, UA: NEGATIVE
Glucose, UA: NEGATIVE
Ketones, UA: NEGATIVE
Nitrite, UA: NEGATIVE
Protein, UA: NEGATIVE
Spec Grav, UA: 1.01
Urobilinogen, UA: 0.2
pH, UA: 5.5

## 2011-11-02 LAB — HEPATIC FUNCTION PANEL
ALT: 17 U/L (ref 0–35)
AST: 20 U/L (ref 0–37)
Albumin: 3.9 g/dL (ref 3.5–5.2)
Alkaline Phosphatase: 53 U/L (ref 39–117)
Bilirubin, Direct: 0.1 mg/dL (ref 0.0–0.3)
Total Bilirubin: 0.7 mg/dL (ref 0.3–1.2)
Total Protein: 6.4 g/dL (ref 6.0–8.3)

## 2011-11-02 LAB — BASIC METABOLIC PANEL
BUN: 13 mg/dL (ref 6–23)
CO2: 28 mEq/L (ref 19–32)
Calcium: 9.3 mg/dL (ref 8.4–10.5)
Chloride: 105 mEq/L (ref 96–112)
Creatinine, Ser: 0.9 mg/dL (ref 0.4–1.2)
GFR: 65.59 mL/min (ref >60.00)
Glucose, Bld: 94 mg/dL (ref 70–99)
Potassium: 4.6 mEq/L (ref 3.5–5.1)
Sodium: 141 mEq/L (ref 135–145)

## 2011-11-02 LAB — LIPID PANEL
Cholesterol: 180 mg/dL (ref 0–200)
HDL: 49.6 mg/dL (ref >39.00)
LDL Cholesterol: 93 mg/dL (ref 0–99)
Total CHOL/HDL Ratio: 4
Triglycerides: 187 mg/dL — ABNORMAL HIGH (ref 0.0–149.0)
VLDL: 37.4 mg/dL (ref 0.0–40.0)

## 2011-11-02 LAB — TSH: TSH: 0.19 u[IU]/mL — ABNORMAL LOW (ref 0.35–5.50)

## 2011-11-07 ENCOUNTER — Encounter: Payer: Federal, State, Local not specified - PPO | Admitting: Family Medicine

## 2011-11-09 ENCOUNTER — Encounter: Payer: Self-pay | Admitting: Family Medicine

## 2011-11-09 ENCOUNTER — Ambulatory Visit (INDEPENDENT_AMBULATORY_CARE_PROVIDER_SITE_OTHER): Payer: Federal, State, Local not specified - PPO | Admitting: Family Medicine

## 2011-11-09 VITALS — BP 120/80 | Temp 98.0°F | Ht 67.0 in | Wt 197.0 lb

## 2011-11-09 DIAGNOSIS — Z23 Encounter for immunization: Secondary | ICD-10-CM

## 2011-11-09 DIAGNOSIS — E042 Nontoxic multinodular goiter: Secondary | ICD-10-CM

## 2011-11-09 DIAGNOSIS — Z0000 Encounter for general adult medical examination without abnormal findings: Secondary | ICD-10-CM

## 2011-11-09 DIAGNOSIS — K219 Gastro-esophageal reflux disease without esophagitis: Secondary | ICD-10-CM

## 2011-11-09 DIAGNOSIS — E785 Hyperlipidemia, unspecified: Secondary | ICD-10-CM

## 2011-11-09 DIAGNOSIS — G609 Hereditary and idiopathic neuropathy, unspecified: Secondary | ICD-10-CM

## 2011-11-09 DIAGNOSIS — I10 Essential (primary) hypertension: Secondary | ICD-10-CM

## 2011-11-09 MED ORDER — LISINOPRIL-HYDROCHLOROTHIAZIDE 20-25 MG PO TABS: 0.5000 | ORAL_TABLET | Freq: Every day | ORAL | Status: DC

## 2011-11-09 MED ORDER — CLONIDINE HCL 0.1 MG PO TABS: 0.1000 mg | ORAL_TABLET | Freq: Every day | ORAL | Status: DC

## 2011-11-09 MED ORDER — ZOLPIDEM TARTRATE 5 MG PO TABS: 5.0000 mg | ORAL_TABLET | Freq: Every evening | ORAL | Status: DC | PRN

## 2011-11-09 MED ORDER — ROSUVASTATIN CALCIUM 10 MG PO TABS: 10.0000 mg | ORAL_TABLET | Freq: Every day | ORAL | Status: DC

## 2011-11-09 MED ORDER — LEVOTHYROXINE SODIUM 100 MCG PO TABS: 100.0000 ug | ORAL_TABLET | Freq: Every day | ORAL | Status: DC

## 2011-11-09 NOTE — Patient Instructions (Addendum)
Ambien 5 mg one half tab at bedtime as needed for sleep  Continue your other medications  Followup in 1 year sooner if any problems

## 2011-11-09 NOTE — Progress Notes (Signed)
  Subjective:    Patient ID: Brandi Spence, female    DOB: 1945-09-29, 66 y.o.   MRN: [id number]  HPI Brandi Spence is a 66 year old married female nonsmoker who comes in today for a Medicare wellness examination because of multiple issues  Her med list was reviewed in detail and there have been no changes. She continues to see Dr. Vear Clock at the pain clinic and is on Lyrica 100 mg twice daily for neuropathy and pain of unknown etiology.  She gets routine eye care, dental care, BSE monthly, and you mammography, colonoscopy 9 years ago normal, tetanus 2008, shingles 2008, Pneumovax x2, flu shot today.  Cognitive function normal she walks on a regular basis home health safety reviewed no issues identified, no guns in the house, she does have a health care power of attorney and living well  She sees her endocrinologist on a regular basis because of the goiter.  She also sees her GYN who gives her Xanax 0.5 when necessary for anxiety.   Review of Systems  Constitutional: Negative.   HENT: Negative.   Eyes: Negative.   Respiratory: Negative.   Cardiovascular: Negative.   Gastrointestinal: Negative.   Genitourinary: Negative.   Musculoskeletal: Negative.   Neurological: Negative.   Hematological: Negative.   Psychiatric/Behavioral: Negative.        Objective:   Physical Exam  Constitutional: She appears well-developed and well-nourished.  HENT:  Head: Normocephalic and atraumatic.  Right Ear: External ear normal.  Left Ear: External ear normal.  Nose: Nose normal.  Mouth/Throat: Oropharynx is clear and moist.  Eyes: EOM are normal. Pupils are equal, round, and reactive to light.  Neck: Normal range of motion. Neck supple. No thyromegaly present.  Cardiovascular: Normal rate, regular rhythm, normal heart sounds and intact distal pulses.  Exam reveals no gallop and no friction rub.   No murmur heard. Pulmonary/Chest: Effort normal and breath sounds normal.  Abdominal: Soft. Bowel  sounds are normal. She exhibits no distension and no mass. There is no tenderness. There is no rebound.  Genitourinary:       Bilateral breast exam normal except for scars from previous reduction mammoplasty  Musculoskeletal: Normal range of motion.  Lymphadenopathy:    She has no cervical adenopathy.  Neurological: She is alert. She has normal reflexes. No cranial nerve deficit. She exhibits normal muscle tone. Coordination normal.  Skin: Skin is warm and dry.  Psychiatric: She has a normal mood and affect. Her behavior is normal. Judgment and thought content normal.          Assessment & Plan:  Healthy female  Anxiety continue alprazolam via GYN  Hypertension continue clonidine 0.1 daily and lisinopril 20-25 daily  Multinodular goiter continue Synthroid 100 mcg daily  Chronic pain continue Lyrica 100 twice a day followup by Dr. Vear Clock  Hyperlipidemia continue Crestor 20 mg daily  Insomnia Ambien but decrease dose to 5 mg daily each bedtime when necessary

## 2012-04-08 ENCOUNTER — Other Ambulatory Visit: Payer: Federal, State, Local not specified - PPO

## 2012-05-27 ENCOUNTER — Other Ambulatory Visit: Payer: Self-pay | Admitting: Endocrinology

## 2012-05-27 DIAGNOSIS — E041 Nontoxic single thyroid nodule: Secondary | ICD-10-CM

## 2012-05-30 ENCOUNTER — Ambulatory Visit
Admission: RE | Admit: 2012-05-30 | Discharge: 2012-05-30 | Disposition: A | Discharge status: Home / Self Care | Payer: Federal, State, Local not specified - PPO | Source: Ambulatory Visit | Attending: Endocrinology | Admitting: Endocrinology

## 2012-05-30 DIAGNOSIS — E041 Nontoxic single thyroid nodule: Secondary | ICD-10-CM

## 2012-06-05 ENCOUNTER — Other Ambulatory Visit: Payer: Self-pay | Admitting: Family Medicine

## 2012-09-28 ENCOUNTER — Other Ambulatory Visit: Payer: Self-pay | Admitting: Family Medicine

## 2012-10-14 ENCOUNTER — Encounter: Payer: Self-pay | Admitting: Internal Medicine

## 2012-10-17 ENCOUNTER — Encounter: Payer: Self-pay | Admitting: Internal Medicine

## 2012-10-25 ENCOUNTER — Other Ambulatory Visit: Payer: Self-pay | Admitting: Family Medicine

## 2012-10-30 ENCOUNTER — Other Ambulatory Visit: Payer: Self-pay | Admitting: Family Medicine

## 2012-12-05 ENCOUNTER — Ambulatory Visit (AMBULATORY_SURGERY_CENTER): Payer: Self-pay | Admitting: *Deleted

## 2012-12-05 VITALS — Ht 67.5 in | Wt 191.2 lb

## 2012-12-05 DIAGNOSIS — Z800 Family history of malignant neoplasm of digestive organs: Secondary | ICD-10-CM

## 2012-12-05 MED ORDER — MOVIPREP 100 G PO SOLR: ORAL | Status: DC

## 2012-12-05 NOTE — Progress Notes (Signed)
No allergies to eggs or soy. No problems with anesthesia.  

## 2012-12-09 ENCOUNTER — Encounter: Payer: Self-pay | Admitting: Internal Medicine

## 2012-12-27 ENCOUNTER — Encounter: Payer: Federal, State, Local not specified - PPO | Admitting: Internal Medicine

## 2013-01-01 ENCOUNTER — Encounter: Payer: Self-pay | Admitting: Internal Medicine

## 2013-01-01 ENCOUNTER — Ambulatory Visit (AMBULATORY_SURGERY_CENTER): Payer: Federal, State, Local not specified - PPO | Admitting: Internal Medicine

## 2013-01-01 VITALS — BP 117/70 | HR 55 | Temp 97.2°F | Resp 50 | Ht 67.5 in | Wt 191.0 lb

## 2013-01-01 DIAGNOSIS — D126 Benign neoplasm of colon, unspecified: Secondary | ICD-10-CM

## 2013-01-01 DIAGNOSIS — Z800 Family history of malignant neoplasm of digestive organs: Secondary | ICD-10-CM

## 2013-01-01 HISTORY — PX: COLONOSCOPY: SHX174

## 2013-01-01 MED ORDER — SODIUM CHLORIDE 0.9 % IV SOLN: 500.0000 mL | INTRAVENOUS | Status: DC

## 2013-01-01 NOTE — Op Note (Signed)
[facility] [address] [place], [address]   COLONOSCOPY PROCEDURE REPORT  PATIENT: Brandi, Spence  MR#: [id number] BIRTHDATE: August 23, 1945 , 66  yrs. old GENDER: Female ENDOSCOPIST: Hart Carwin, MD REFERRED XB:[id number] Shawnie Dapper, M.D. PROCEDURE DATE:  01/01/2013 PROCEDURE:   Colonoscopy, screening First Screening Colonoscopy - Avg.  risk and is 50 yrs.  old or older - No.  Prior Negative Screening - Now for repeat screening. Other: See Comments Prior Negative Screening - Now for repeat screening.  Above average risk  History of Adenoma - Now for follow-up colonoscopy & has been > or = to 3 yrs.  Yes hx of adenoma.  Has been 3 or more years since last colonoscopy.  Polyps Removed Today? Yes. ASA CLASS:   Class II INDICATIONS:Patient's family history of colon polyps, Patient's personal history of adenomatous colon polyps, and prior clolon 2004, 2009- adenomatous polyp, colon cancer in a parent. MEDICATIONS: MAC sedation, administered by CRNA and propofol (Diprivan) 250mg  IV  DESCRIPTION OF PROCEDURE:   After the risks benefits and alternatives of the procedure were thoroughly explained, informed consent was obtained.  A digital rectal exam revealed no abnormalities of the rectum.   The LB [id number]-H190 [id number]  endoscope was introduced through the anus and advanced to the cecum, which was identified by both the appendix and ileocecal valve. No adverse events experienced.   The quality of the prep was good, using MoviPrep  The instrument was then slowly withdrawn as the colon was fully examined.      COLON FINDINGS: Two sessile polyps ranging between 3-32mm in size were found at the cecum. and at 20 cm. A polypectomy was performed with cold forceps.  The resection was complete and the polyp tissue was completely retrieved.  Retroflexed views revealed no abnormalities. The time to cecum=7 minutes 38 seconds.  Withdrawal time=11 minutes 48 seconds.  The scope  was withdrawn and the procedure completed. COMPLICATIONS: There were no complications.  ENDOSCOPIC IMPRESSION: Two sessile polyps ranging between 3-51mm in size were found at the cecum and at 20 cm , polypectomy was performed with cold forceps  RECOMMENDATIONS: 1.  Await pathology results 2.  High fiber diet 3.   recall colonoscopy in 5 years   eSigned:  Hart Carwin, MD 01/01/2013 11:52 AM   cc:   PATIENT NAME:  Brandi, Spence MR#: [id number]

## 2013-01-01 NOTE — Progress Notes (Signed)
Patient did not experience any of the following events: a burn prior to discharge; a fall within the facility; wrong site/side/patient/procedure/implant event; or a hospital transfer or hospital admission upon discharge from the facility. (G8907) Patient did not have preoperative order for IV antibiotic SSI prophylaxis. (G8918)  

## 2013-01-01 NOTE — Progress Notes (Signed)
Called to room to assist during endoscopic procedure.  Patient ID and intended procedure confirmed with present staff. Received instructions for my participation in the procedure from the performing physician. ewm 

## 2013-01-01 NOTE — Patient Instructions (Signed)

## 2013-01-02 ENCOUNTER — Telehealth: Payer: Self-pay | Admitting: *Deleted

## 2013-01-02 NOTE — Telephone Encounter (Signed)
  Follow up Call-  Call back number 01/01/2013  Post procedure Call Back phone  # [phone] spell last name for extension  Permission to leave phone message Yes     Patient questions:  Do you have a fever, pain , or abdominal swelling? no Pain Score  0 *  Have you tolerated food without any problems? yes  Have you been able to return to your normal activities? yes  Do you have any questions about your discharge instructions: Diet   no Medications  no Follow up visit  no  Do you have questions or concerns about your Care? no  Actions: * If pain score is 4 or above: No action needed, pain <4.

## 2013-01-07 ENCOUNTER — Encounter: Payer: Self-pay | Admitting: Internal Medicine

## 2013-01-22 ENCOUNTER — Other Ambulatory Visit: Payer: Self-pay | Admitting: Dermatology

## 2013-04-03 ENCOUNTER — Other Ambulatory Visit (INDEPENDENT_AMBULATORY_CARE_PROVIDER_SITE_OTHER): Payer: Federal, State, Local not specified - PPO

## 2013-04-03 DIAGNOSIS — Z0000 Encounter for general adult medical examination without abnormal findings: Secondary | ICD-10-CM

## 2013-04-03 LAB — HEPATIC FUNCTION PANEL
ALT: 14 U/L (ref 0–35)
AST: 16 U/L (ref 0–37)
Albumin: 4.1 g/dL (ref 3.5–5.2)
Alkaline Phosphatase: 58 U/L (ref 39–117)
Bilirubin, Direct: 0 mg/dL (ref 0.0–0.3)
Total Bilirubin: 0.6 mg/dL (ref 0.3–1.2)
Total Protein: 6.9 g/dL (ref 6.0–8.3)

## 2013-04-03 LAB — POCT URINALYSIS DIPSTICK
Bilirubin, UA: NEGATIVE
Blood, UA: NEGATIVE
Glucose, UA: NEGATIVE
Ketones, UA: NEGATIVE
Nitrite, UA: NEGATIVE
Protein, UA: NEGATIVE
Spec Grav, UA: 1.025
Urobilinogen, UA: 0.2
pH, UA: 5

## 2013-04-03 LAB — CBC WITH DIFFERENTIAL/PLATELET
Basophils Absolute: 0 10*3/uL (ref 0.0–0.1)
Basophils Relative: 0.6 % (ref 0.0–3.0)
Eosinophils Absolute: 0.1 10*3/uL (ref 0.0–0.7)
Eosinophils Relative: 2.5 % (ref 0.0–5.0)
HCT: 41.6 % (ref 36.0–46.0)
Hemoglobin: 13.7 g/dL (ref 12.0–15.0)
Lymphocytes Relative: 41.1 % (ref 12.0–46.0)
Lymphs Abs: 1.8 10*3/uL (ref 0.7–4.0)
MCHC: 33 g/dL (ref 30.0–36.0)
MCV: 91.3 fl (ref 78.0–100.0)
Monocytes Absolute: 0.5 10*3/uL (ref 0.1–1.0)
Monocytes Relative: 10.6 % (ref 3.0–12.0)
Neutro Abs: 2 10*3/uL (ref 1.4–7.7)
Neutrophils Relative %: 45.2 % (ref 43.0–77.0)
Platelets: 267 10*3/uL (ref 150.0–400.0)
RBC: 4.56 Mil/uL (ref 3.87–5.11)
RDW: 13.1 % (ref 11.5–14.6)
WBC: 4.4 10*3/uL — ABNORMAL LOW (ref 4.5–10.5)

## 2013-04-03 LAB — BASIC METABOLIC PANEL
BUN: 21 mg/dL (ref 6–23)
CO2: 29 mEq/L (ref 19–32)
Calcium: 9.3 mg/dL (ref 8.4–10.5)
Chloride: 103 mEq/L (ref 96–112)
Creatinine, Ser: 0.8 mg/dL (ref 0.4–1.2)
GFR: 74.7 mL/min (ref >60.00)
Glucose, Bld: 96 mg/dL (ref 70–99)
Potassium: 4.1 mEq/L (ref 3.5–5.1)
Sodium: 140 mEq/L (ref 135–145)

## 2013-04-03 LAB — LIPID PANEL
Cholesterol: 232 mg/dL — ABNORMAL HIGH (ref 0–200)
HDL: 54.2 mg/dL (ref >39.00)
Total CHOL/HDL Ratio: 4
Triglycerides: 168 mg/dL — ABNORMAL HIGH (ref 0.0–149.0)
VLDL: 33.6 mg/dL (ref 0.0–40.0)

## 2013-04-03 LAB — TSH: TSH: 0.7 u[IU]/mL (ref 0.35–5.50)

## 2013-04-03 LAB — LDL CHOLESTEROL, DIRECT: Direct LDL: 155.7 mg/dL

## 2013-04-10 ENCOUNTER — Ambulatory Visit (INDEPENDENT_AMBULATORY_CARE_PROVIDER_SITE_OTHER): Payer: Federal, State, Local not specified - PPO | Admitting: Family Medicine

## 2013-04-10 ENCOUNTER — Encounter: Payer: Self-pay | Admitting: Family Medicine

## 2013-04-10 VITALS — BP 110/70 | Temp 97.6°F | Ht 66.25 in | Wt 188.0 lb

## 2013-04-10 DIAGNOSIS — I10 Essential (primary) hypertension: Secondary | ICD-10-CM

## 2013-04-10 DIAGNOSIS — H6120 Impacted cerumen, unspecified ear: Secondary | ICD-10-CM

## 2013-04-10 DIAGNOSIS — E042 Nontoxic multinodular goiter: Secondary | ICD-10-CM

## 2013-04-10 DIAGNOSIS — G609 Hereditary and idiopathic neuropathy, unspecified: Secondary | ICD-10-CM

## 2013-04-10 DIAGNOSIS — Z0000 Encounter for general adult medical examination without abnormal findings: Secondary | ICD-10-CM

## 2013-04-10 DIAGNOSIS — E785 Hyperlipidemia, unspecified: Secondary | ICD-10-CM

## 2013-04-10 DIAGNOSIS — H6123 Impacted cerumen, bilateral: Secondary | ICD-10-CM | POA: Insufficient documentation

## 2013-04-10 DIAGNOSIS — Z23 Encounter for immunization: Secondary | ICD-10-CM

## 2013-04-10 MED ORDER — LISINOPRIL-HYDROCHLOROTHIAZIDE 20-25 MG PO TABS: ORAL_TABLET | ORAL | Status: DC

## 2013-04-10 MED ORDER — ROSUVASTATIN CALCIUM 10 MG PO TABS: 10.0000 mg | ORAL_TABLET | Freq: Every day | ORAL | Status: DC

## 2013-04-10 NOTE — Patient Instructions (Signed)
Continue your current medications  Begin a walking program 20 minutes daily  We will get you set up for followup bone density

## 2013-04-10 NOTE — Progress Notes (Signed)
Pre visit review using our clinic review tool, if applicable. No additional management support is needed unless otherwise documented below in the visit note. 

## 2013-04-10 NOTE — Progress Notes (Signed)
   Subjective:    Patient ID: Brandi Spence, female    DOB: 1945-06-08, 68 y.o.   MRN: [id number]  HPI is a 68 year old female nonsmoker who comes in today for general physical examination  GYN Dr. Ouida Sills gives her Xanax and clonidine.  Her endocrinologist says her on Synthroid 75 mcg daily. She has a goiter lateral left nodule  For hypertension she takes Zestoretic 20-12.5 daily BP 110/70  She goes to the pain clinic because of neuropathy and is on gabapentin .Marland Kitchen... dose 150 mg 3 times daily.  She's also on Crestor 10 mg daily for hyperlipidemia and Ambien 5 mg each bedtime for sleep  She continues to work at the [place] [employer] office. Other than walking at work she does not do any regular exercise.  She gets routine eye care, dental care, BSE monthly, and you mammography, colonoscopy recently sleet showed a couple polyps.    Review of Systems  Constitutional: Negative.   HENT: Negative.   Eyes: Negative.   Respiratory: Negative.   Cardiovascular: Negative.   Gastrointestinal: Negative.   Endocrine: Negative.   Genitourinary: Negative.   Musculoskeletal: Negative.   Allergic/Immunologic: Negative.   Neurological: Negative.   Hematological: Negative.   Psychiatric/Behavioral: Negative.        Objective:   Physical Exam  Nursing note and vitals reviewed. Constitutional: She appears well-developed and well-nourished.  HENT:  Head: Normocephalic and atraumatic.  Right Ear: External ear normal.  Left Ear: External ear normal.  Nose: Nose normal.  Mouth/Throat: Oropharynx is clear and moist.  Eyes: EOM are normal. Pupils are equal, round, and reactive to light.  Neck: Normal range of motion. Neck supple. No thyromegaly present.  Cardiovascular: Normal rate, regular rhythm, normal heart sounds and intact distal pulses.  Exam reveals no gallop and no friction rub.   No murmur heard. No carotid aortic bruits peripheral pulses 2+ and symmetrical  Pulmonary/Chest: Effort  normal and breath sounds normal.  Abdominal: Soft. Bowel sounds are normal. She exhibits no distension and no mass. There is no tenderness. There is no rebound.  Genitourinary:  Pelvic examination by GYN  Bilateral breast exam shows scars around each nipple from previous breast reduction no palpable masses  Musculoskeletal: Normal range of motion.  Lymphadenopathy:    She has no cervical adenopathy.  Neurological: She is alert. She has normal reflexes. No cranial nerve deficit. She exhibits normal muscle tone. Coordination normal.  Skin: Skin is warm and dry.  Total body skin exam normal  Psychiatric: She has a normal mood and affect. Her behavior is normal. Judgment and thought content normal.          Assessment & Plan:  Healthy female  Hypertension refill Zestoretic  Hyperlipidemia refill Crestor  Postmenopausal followed by GYN  Thyroid nodule followed by endocrinology  Neuropathy followed in the pain clinic

## 2013-04-11 ENCOUNTER — Telehealth: Payer: Self-pay | Admitting: Family Medicine

## 2013-04-11 NOTE — Telephone Encounter (Signed)
Relevant patient education assigned to patient using Emmi. ° °

## 2013-04-25 ENCOUNTER — Ambulatory Visit (INDEPENDENT_AMBULATORY_CARE_PROVIDER_SITE_OTHER)
Admission: RE | Admit: 2013-04-25 | Discharge: 2013-04-25 | Disposition: A | Discharge status: Home / Self Care | Payer: Federal, State, Local not specified - PPO | Source: Ambulatory Visit | Attending: Family Medicine | Admitting: Family Medicine

## 2013-04-25 DIAGNOSIS — M949 Disorder of cartilage, unspecified: Secondary | ICD-10-CM | POA: Diagnosis not present

## 2013-04-25 DIAGNOSIS — Z0000 Encounter for general adult medical examination without abnormal findings: Secondary | ICD-10-CM

## 2013-04-25 DIAGNOSIS — M899 Disorder of bone, unspecified: Secondary | ICD-10-CM

## 2013-05-30 ENCOUNTER — Other Ambulatory Visit: Payer: Self-pay | Admitting: Endocrinology

## 2013-05-30 DIAGNOSIS — E041 Nontoxic single thyroid nodule: Secondary | ICD-10-CM

## 2013-06-02 ENCOUNTER — Ambulatory Visit
Admission: RE | Admit: 2013-06-02 | Discharge: 2013-06-02 | Disposition: A | Discharge status: Home / Self Care | Payer: Federal, State, Local not specified - PPO | Source: Ambulatory Visit | Attending: Endocrinology | Admitting: Endocrinology

## 2013-06-02 DIAGNOSIS — E041 Nontoxic single thyroid nodule: Secondary | ICD-10-CM

## 2013-10-27 ENCOUNTER — Telehealth: Payer: Self-pay | Admitting: Family Medicine

## 2013-10-27 NOTE — Telephone Encounter (Signed)
Spoke with patient and informed her that we do not have rabies injections here.  Patient will call animal control for further advise and go to the ER as needed.

## 2013-10-27 NOTE — Telephone Encounter (Signed)
Patient Information:  Caller Name: Brandi Spence  Phone: [phone]  Patient: Brandi Spence, Brandi Spence  Gender: Female  DOB: 1945/08/14  Age: 68 Years  PCP: [facility])  Office Follow Up:  Does the office need to follow up with this patient?: Yes  Instructions For The Office: Please contact on cell phone- [phone].  Please contact patient regarding ED visit for Rabies .  RN Note:  Please contact on cell phone- [phone].  Please contact patient.[name] bites are painless and difficult to see. A bat bite is about the same size as a puncture wound from a 27-gauge needle. Rabies postexposure prophylaxis should be considered when direct contact between a human and a bat has occurred, unless the exposed person can be certain a bite, scratch, or mucous membrane exposure did not occur (ACIP 1999). Exposure that occurs during sleep, involving unattended preverbal children, or involving mentally disabled individuals is included. Rabies postexposure prophylaxis should also be considered for persons who were in the same room as the bat and who might be unaware that a bite or direct contact had occurred (e.g., a sleeping person awakens to find a bat in the room) (ACIP 1999).  Symptoms  Reason For Call & Symptoms: Patient states she awoke on 10/16/13 at 05:30 with a bat in her bedroom. She hit it with a coat hanger and broom and got it out the back door.  She mentioned this to a friend her told her she should have been checked. Last Tetanus 2008.  Office protocol that Rabies shots are not given in the office.   Please contact in regards to ED  Reviewed Health History In EMR: Yes  Reviewed Medications In EMR: Yes  Reviewed Allergies In EMR: Yes  Reviewed Surgeries / Procedures: Yes  Date of Onset of Symptoms: 10/16/2013  Guideline(s) Used:  Academic librarian  Disposition Per Guideline:   See Today in Office  Reason For Disposition Reached:   No bite mark and suspicious bat exposure  (e.g., bat found in same room as sleeping adult)  Advice Given:  Call Back If:  Wound begins to look infected (redness, swelling, warmth, tender to touch, or red streaks)  You become worse.  RN Overrode Recommendation:  Go To ED  Please contact on cell phone- [phone].  Please contact patient regarding ED visit.

## 2013-12-14 ENCOUNTER — Other Ambulatory Visit: Payer: Self-pay | Admitting: Family Medicine

## 2014-01-01 ENCOUNTER — Other Ambulatory Visit: Payer: Self-pay | Admitting: Family Medicine

## 2014-03-30 ENCOUNTER — Telehealth: Payer: Self-pay | Admitting: Family Medicine

## 2014-03-30 NOTE — Telephone Encounter (Signed)
Patient states her bone density done on 05/16/2013 was coded incorrectly and she is receiving a bill.  Her insurance states is covered as preventive care 100% and the description states osteoporosis annual screening for women 56 and older.

## 2014-04-08 NOTE — Telephone Encounter (Signed)
Please inform patient her bone density was coded as preventative screening and she should direct and follow up questions or concerns to our billing office at [phone].

## 2014-04-08 NOTE — Telephone Encounter (Signed)
LMOM regarding below response from [place].

## 2014-04-21 DIAGNOSIS — G894 Chronic pain syndrome: Secondary | ICD-10-CM | POA: Insufficient documentation

## 2014-04-21 DIAGNOSIS — G5793 Unspecified mononeuropathy of bilateral lower limbs: Secondary | ICD-10-CM | POA: Insufficient documentation

## 2014-06-28 ENCOUNTER — Other Ambulatory Visit: Payer: Self-pay | Admitting: Family Medicine

## 2014-07-13 ENCOUNTER — Other Ambulatory Visit (INDEPENDENT_AMBULATORY_CARE_PROVIDER_SITE_OTHER): Payer: Federal, State, Local not specified - PPO

## 2014-07-13 DIAGNOSIS — R7989 Other specified abnormal findings of blood chemistry: Secondary | ICD-10-CM

## 2014-07-13 DIAGNOSIS — Z0000 Encounter for general adult medical examination without abnormal findings: Secondary | ICD-10-CM

## 2014-07-13 LAB — CBC WITH DIFFERENTIAL/PLATELET
Basophils Absolute: 0 10*3/uL (ref 0.0–0.1)
Basophils Relative: 0.9 % (ref 0.0–3.0)
Eosinophils Absolute: 0.2 10*3/uL (ref 0.0–0.7)
Eosinophils Relative: 4.3 % (ref 0.0–5.0)
HCT: 39.9 % (ref 36.0–46.0)
Hemoglobin: 13.8 g/dL (ref 12.0–15.0)
Lymphocytes Relative: 38.8 % (ref 12.0–46.0)
Lymphs Abs: 1.8 10*3/uL (ref 0.7–4.0)
MCHC: 34.6 g/dL (ref 30.0–36.0)
MCV: 87.3 fl (ref 78.0–100.0)
Monocytes Absolute: 0.5 10*3/uL (ref 0.1–1.0)
Monocytes Relative: 10.4 % (ref 3.0–12.0)
Neutro Abs: 2.1 10*3/uL (ref 1.4–7.7)
Neutrophils Relative %: 45.6 % (ref 43.0–77.0)
Platelets: 278 10*3/uL (ref 150.0–400.0)
RBC: 4.57 Mil/uL (ref 3.87–5.11)
RDW: 13.1 % (ref 11.5–15.5)
WBC: 4.7 10*3/uL (ref 4.0–10.5)

## 2014-07-13 LAB — COMPREHENSIVE METABOLIC PANEL
ALT: 12 U/L (ref 0–35)
AST: 13 U/L (ref 0–37)
Albumin: 4 g/dL (ref 3.5–5.2)
Alkaline Phosphatase: 56 U/L (ref 39–117)
BUN: 17 mg/dL (ref 6–23)
CO2: 31 mEq/L (ref 19–32)
Calcium: 9.5 mg/dL (ref 8.4–10.5)
Chloride: 103 mEq/L (ref 96–112)
Creatinine, Ser: 0.76 mg/dL (ref 0.40–1.20)
GFR: 80.1 mL/min (ref >60.00)
Glucose, Bld: 98 mg/dL (ref 70–99)
Potassium: 4 mEq/L (ref 3.5–5.1)
Sodium: 139 mEq/L (ref 135–145)
Total Bilirubin: 0.5 mg/dL (ref 0.2–1.2)
Total Protein: 6.6 g/dL (ref 6.0–8.3)

## 2014-07-13 LAB — POCT URINALYSIS DIPSTICK
Bilirubin, UA: NEGATIVE
Blood, UA: NEGATIVE
Glucose, UA: NEGATIVE
Ketones, UA: NEGATIVE
Nitrite, UA: NEGATIVE
Protein, UA: NEGATIVE
Spec Grav, UA: 1.025
Urobilinogen, UA: 0.2
pH, UA: 5.5

## 2014-07-13 LAB — LIPID PANEL
Cholesterol: 208 mg/dL — ABNORMAL HIGH (ref 0–200)
HDL: 53.7 mg/dL (ref >39.00)
NonHDL: 154.3
Total CHOL/HDL Ratio: 4
Triglycerides: 268 mg/dL — ABNORMAL HIGH (ref 0.0–149.0)
VLDL: 53.6 mg/dL — ABNORMAL HIGH (ref 0.0–40.0)

## 2014-07-13 LAB — TSH: TSH: 0.65 u[IU]/mL (ref 0.35–4.50)

## 2014-07-13 LAB — LDL CHOLESTEROL, DIRECT: Direct LDL: 114 mg/dL

## 2014-07-20 ENCOUNTER — Encounter: Payer: Self-pay | Admitting: Family Medicine

## 2014-07-20 ENCOUNTER — Ambulatory Visit (INDEPENDENT_AMBULATORY_CARE_PROVIDER_SITE_OTHER): Payer: Federal, State, Local not specified - PPO | Admitting: Family Medicine

## 2014-07-20 VITALS — BP 140/90 | Temp 98.2°F | Ht 67.0 in | Wt 192.0 lb

## 2014-07-20 DIAGNOSIS — I10 Essential (primary) hypertension: Secondary | ICD-10-CM

## 2014-07-20 DIAGNOSIS — Z0000 Encounter for general adult medical examination without abnormal findings: Secondary | ICD-10-CM | POA: Diagnosis not present

## 2014-07-20 DIAGNOSIS — E785 Hyperlipidemia, unspecified: Secondary | ICD-10-CM | POA: Diagnosis not present

## 2014-07-20 MED ORDER — LISINOPRIL-HYDROCHLOROTHIAZIDE 20-25 MG PO TABS: ORAL_TABLET | ORAL | Status: DC

## 2014-07-20 MED ORDER — SIMVASTATIN 20 MG PO TABS: 20.0000 mg | ORAL_TABLET | Freq: Every day | ORAL | Status: DC

## 2014-07-20 NOTE — Patient Instructions (Signed)
Continue current medications  Zocor 20 mg.......... start by taking 1 at bedtime Monday Wednesday Friday for 2 months....... if indeed you feel okay and no muscle pain on that then continue just taking it Monday Wednesday Friday  Return in one year for general physical exam sooner if any problems  Brandi Spence,,,,,,,,,,,,, our new adult Designer, jewellery from [place]

## 2014-07-20 NOTE — Progress Notes (Signed)
   Subjective:    Patient ID: Brandi Spence, female    DOB: Oct 12, 1945, 69 y.o.   MRN: [id number]  HPI Brandi Spence is a 69 year old female nonsmoker who comes in today for general physical examination because of a history of hypertension and hyperlipidemia  She gets routine eye care, dental care, BSE monthly and annual mammography, colonoscopy was 2014. She's due to go back every 5 years because her follow had colon cancer.  Vaccinations up-to-date  She had her uterus and ovaries removed in 2005 for nonmalignant reasons despite that that she still goes to GYN has a physical every year. Advised that that is no longer indicated nor necessary.  She takes Synthroid 75 g from her endocrinologist for hypothyroidism and a multinodular goiter.  She takes Zestoretic 20-25 dose one half tab daily for hypertension BP 140/90  She stopped her Crestor because of muscle pain. She like to discuss other options. She has a neuropathy for which she is on Lyrica from Dr. Hardin Negus.   Review of Systems  Constitutional: Negative.   HENT: Negative.   Eyes: Negative.   Respiratory: Negative.   Cardiovascular: Negative.   Gastrointestinal: Negative.   Endocrine: Negative.   Genitourinary: Negative.   Musculoskeletal: Negative.   Skin: Negative.   Allergic/Immunologic: Negative.   Neurological: Negative.   Hematological: Negative.   Psychiatric/Behavioral: Negative.        Objective:   Physical Exam  Constitutional: She appears well-developed and well-nourished.  HENT:  Head: Normocephalic and atraumatic.  Right Ear: External ear normal.  Left Ear: External ear normal.  Nose: Nose normal.  Mouth/Throat: Oropharynx is clear and moist.  Eyes: EOM are normal. Pupils are equal, round, and reactive to light.  Neck: Normal range of motion. Neck supple. No JVD present. No tracheal deviation present. No thyromegaly present.  Multinodular goiter unchanged  Cardiovascular: Normal rate, regular rhythm,  normal heart sounds and intact distal pulses.  Exam reveals no gallop and no friction rub.   No murmur heard. Pulmonary/Chest: Effort normal and breath sounds normal. No stridor. No respiratory distress. She has no wheezes. She has no rales. She exhibits no tenderness.  Abdominal: Soft. Bowel sounds are normal. She exhibits no distension and no mass. There is no tenderness. There is no rebound and no guarding.  Genitourinary:  She says she's going back to see her GYN therefore we will defer the breast exam to them. She also gets her mammograms that are too. Again explain pelvic and Pap no longer indicated she had never uterus or ovaries  Musculoskeletal: Normal range of motion.  Lymphadenopathy:    She has no cervical adenopathy.  Neurological: She is alert. She has normal reflexes. No cranial nerve deficit. She exhibits normal muscle tone. Coordination normal.  Skin: Skin is warm and dry. No rash noted. No erythema. No pallor.  Total body skin exam normal  Psychiatric: She has a normal mood and affect. Her behavior is normal. Judgment and thought content normal.  Nursing note and vitals reviewed.         Assessment & Plan:  Hypertension at goal////////// continue current therapy  Hyperlipidemia........ trial of Zocor 20 mg Monday Wednesday Friday  Multinodular goiter........ continue Synthroid follow-up by and  Chronic pain......... followed by Dr. Hardin Negus and the pain clinic.

## 2014-07-20 NOTE — Progress Notes (Signed)
Pre visit review using our clinic review tool, if applicable. No additional management support is needed unless otherwise documented below in the visit note. 

## 2014-07-29 ENCOUNTER — Other Ambulatory Visit: Payer: Self-pay | Admitting: Endocrinology

## 2014-07-29 DIAGNOSIS — E041 Nontoxic single thyroid nodule: Secondary | ICD-10-CM

## 2014-07-30 ENCOUNTER — Ambulatory Visit
Admission: RE | Admit: 2014-07-30 | Discharge: 2014-07-30 | Disposition: A | Discharge status: Home / Self Care | Payer: Federal, State, Local not specified - PPO | Source: Ambulatory Visit | Attending: Endocrinology | Admitting: Endocrinology

## 2014-07-30 DIAGNOSIS — E041 Nontoxic single thyroid nodule: Secondary | ICD-10-CM

## 2014-09-25 ENCOUNTER — Other Ambulatory Visit: Payer: Self-pay | Admitting: Family Medicine

## 2014-10-14 ENCOUNTER — Other Ambulatory Visit: Payer: Self-pay | Admitting: Obstetrics and Gynecology

## 2014-10-14 DIAGNOSIS — R928 Other abnormal and inconclusive findings on diagnostic imaging of breast: Secondary | ICD-10-CM

## 2014-10-16 ENCOUNTER — Ambulatory Visit
Admission: RE | Admit: 2014-10-16 | Discharge: 2014-10-16 | Disposition: A | Discharge status: Home / Self Care | Payer: Federal, State, Local not specified - PPO | Source: Ambulatory Visit | Attending: Obstetrics and Gynecology | Admitting: Obstetrics and Gynecology

## 2014-10-16 DIAGNOSIS — R928 Other abnormal and inconclusive findings on diagnostic imaging of breast: Secondary | ICD-10-CM

## 2015-06-23 ENCOUNTER — Other Ambulatory Visit: Payer: Self-pay | Admitting: Family Medicine

## 2015-07-12 ENCOUNTER — Encounter: Payer: Self-pay | Admitting: Internal Medicine

## 2015-08-04 ENCOUNTER — Other Ambulatory Visit: Payer: Self-pay | Admitting: Endocrinology

## 2015-08-04 DIAGNOSIS — E041 Nontoxic single thyroid nodule: Secondary | ICD-10-CM

## 2015-08-09 ENCOUNTER — Ambulatory Visit
Admission: RE | Admit: 2015-08-09 | Discharge: 2015-08-09 | Disposition: A | Discharge status: Home / Self Care | Payer: Federal, State, Local not specified - PPO | Source: Ambulatory Visit | Attending: Endocrinology | Admitting: Endocrinology

## 2015-08-09 DIAGNOSIS — E041 Nontoxic single thyroid nodule: Secondary | ICD-10-CM

## 2015-09-22 ENCOUNTER — Other Ambulatory Visit: Payer: Self-pay | Admitting: Family Medicine

## 2015-09-24 ENCOUNTER — Other Ambulatory Visit: Payer: Self-pay | Admitting: Family Medicine

## 2015-09-24 NOTE — Telephone Encounter (Signed)
Duplicate request

## 2015-09-27 NOTE — Telephone Encounter (Signed)
Rx refill sent to pharmacy. 

## 2015-10-11 DIAGNOSIS — Z803 Family history of malignant neoplasm of breast: Secondary | ICD-10-CM | POA: Insufficient documentation

## 2016-02-07 ENCOUNTER — Other Ambulatory Visit (INDEPENDENT_AMBULATORY_CARE_PROVIDER_SITE_OTHER): Payer: Federal, State, Local not specified - PPO

## 2016-02-07 DIAGNOSIS — R7989 Other specified abnormal findings of blood chemistry: Secondary | ICD-10-CM | POA: Diagnosis not present

## 2016-02-07 DIAGNOSIS — Z0000 Encounter for general adult medical examination without abnormal findings: Secondary | ICD-10-CM

## 2016-02-07 DIAGNOSIS — Z1159 Encounter for screening for other viral diseases: Secondary | ICD-10-CM

## 2016-02-07 LAB — HEPATIC FUNCTION PANEL
ALT: 12 U/L (ref 0–35)
AST: 11 U/L (ref 0–37)
Albumin: 4.2 g/dL (ref 3.5–5.2)
Alkaline Phosphatase: 52 U/L (ref 39–117)
Bilirubin, Direct: 0.1 mg/dL (ref 0.0–0.3)
Total Bilirubin: 0.5 mg/dL (ref 0.2–1.2)
Total Protein: 6.8 g/dL (ref 6.0–8.3)

## 2016-02-07 LAB — CBC WITH DIFFERENTIAL/PLATELET
Basophils Absolute: 0 10*3/uL (ref 0.0–0.1)
Basophils Relative: 0.6 % (ref 0.0–3.0)
Eosinophils Absolute: 0.1 10*3/uL (ref 0.0–0.7)
Eosinophils Relative: 1.8 % (ref 0.0–5.0)
HCT: 39.8 % (ref 36.0–46.0)
Hemoglobin: 13.6 g/dL (ref 12.0–15.0)
Lymphocytes Relative: 32.6 % (ref 12.0–46.0)
Lymphs Abs: 1.9 10*3/uL (ref 0.7–4.0)
MCHC: 34.2 g/dL (ref 30.0–36.0)
MCV: 88.7 fl (ref 78.0–100.0)
Monocytes Absolute: 0.6 10*3/uL (ref 0.1–1.0)
Monocytes Relative: 10.1 % (ref 3.0–12.0)
Neutro Abs: 3.1 10*3/uL (ref 1.4–7.7)
Neutrophils Relative %: 54.9 % (ref 43.0–77.0)
Platelets: 325 10*3/uL (ref 150.0–400.0)
RBC: 4.49 Mil/uL (ref 3.87–5.11)
RDW: 13.3 % (ref 11.5–15.5)
WBC: 5.7 10*3/uL (ref 4.0–10.5)

## 2016-02-07 LAB — POC URINALSYSI DIPSTICK (AUTOMATED)
Bilirubin, UA: NEGATIVE
Blood, UA: NEGATIVE
Glucose, UA: NEGATIVE
Ketones, UA: NEGATIVE
Nitrite, UA: NEGATIVE
Protein, UA: NEGATIVE
Spec Grav, UA: 1.01
Urobilinogen, UA: 0.2
pH, UA: 6

## 2016-02-07 LAB — BASIC METABOLIC PANEL
BUN: 20 mg/dL (ref 6–23)
CO2: 30 mEq/L (ref 19–32)
Calcium: 9.5 mg/dL (ref 8.4–10.5)
Chloride: 99 mEq/L (ref 96–112)
Creatinine, Ser: 0.99 mg/dL (ref 0.40–1.20)
GFR: 58.77 mL/min — ABNORMAL LOW (ref >60.00)
Glucose, Bld: 110 mg/dL — ABNORMAL HIGH (ref 70–99)
Potassium: 4 mEq/L (ref 3.5–5.1)
Sodium: 138 mEq/L (ref 135–145)

## 2016-02-07 LAB — LIPID PANEL
Cholesterol: 242 mg/dL — ABNORMAL HIGH (ref 0–200)
HDL: 53.7 mg/dL (ref >39.00)
NonHDL: 187.84
Total CHOL/HDL Ratio: 4
Triglycerides: 212 mg/dL — ABNORMAL HIGH (ref 0.0–149.0)
VLDL: 42.4 mg/dL — ABNORMAL HIGH (ref 0.0–40.0)

## 2016-02-07 LAB — TSH: TSH: 0.95 u[IU]/mL (ref 0.35–4.50)

## 2016-02-07 LAB — LDL CHOLESTEROL, DIRECT: Direct LDL: 137 mg/dL

## 2016-02-08 LAB — HEPATITIS C ANTIBODY: HCV Ab: NEGATIVE

## 2016-02-14 ENCOUNTER — Encounter: Payer: Self-pay | Admitting: Family Medicine

## 2016-02-14 ENCOUNTER — Ambulatory Visit (INDEPENDENT_AMBULATORY_CARE_PROVIDER_SITE_OTHER): Payer: Federal, State, Local not specified - PPO | Admitting: Family Medicine

## 2016-02-14 VITALS — BP 110/68 | HR 66 | Ht 65.0 in | Wt 189.2 lb

## 2016-02-14 DIAGNOSIS — E785 Hyperlipidemia, unspecified: Secondary | ICD-10-CM

## 2016-02-14 DIAGNOSIS — I10 Essential (primary) hypertension: Secondary | ICD-10-CM | POA: Diagnosis not present

## 2016-02-14 DIAGNOSIS — Z0000 Encounter for general adult medical examination without abnormal findings: Secondary | ICD-10-CM | POA: Insufficient documentation

## 2016-02-14 DIAGNOSIS — E042 Nontoxic multinodular goiter: Secondary | ICD-10-CM | POA: Diagnosis not present

## 2016-02-14 DIAGNOSIS — G609 Hereditary and idiopathic neuropathy, unspecified: Secondary | ICD-10-CM

## 2016-02-14 DIAGNOSIS — Z23 Encounter for immunization: Secondary | ICD-10-CM

## 2016-02-14 MED ORDER — LISINOPRIL-HYDROCHLOROTHIAZIDE 20-25 MG PO TABS: ORAL_TABLET | ORAL | 4 refills | Status: DC

## 2016-02-14 MED ORDER — SIMVASTATIN 20 MG PO TABS: ORAL_TABLET | ORAL | 4 refills | Status: DC

## 2016-02-14 NOTE — Progress Notes (Signed)
Pre visit review using our clinic review tool, if applicable. No additional management support is needed unless otherwise documented below in the visit note. 

## 2016-02-14 NOTE — Patient Instructions (Signed)
Take the Zocor and aspirin daily  Continue the Zestoretic 20-25 one half tab daily,,,,,,,,,,,,,, blood pressure normal 110/68  Avoid high fructose corn syrup,,,,,,,,,,, sugar,,,,,,,, walk 30 minutes daily  Return in one year for general physical exam sooner if any problems

## 2016-02-14 NOTE — Progress Notes (Signed)
Brandi Spence is a 70 year old female married nonsmoker who comes in today for general physical examination because of a history of hypertension and hyperlipidemia  She takes lisinopril 20-25 one half tab daily for hypertension BP 110/68  She takes Synthroid 75 g daily from her endocrinologist because of a history of a thyroid nodule.  She is off the Catapres. She was taking that for postmenopausal and postsurgical hot flushes. She had her uterus and ovaries removed many years ago. Now she's asymptomatic and is stop the clonidine. She takes Zocor 20 and an aspirin tablet Monday Wednesday Friday for hyperlipidemia. LDL is up advised takes Zocor daily  She takes Ambien 5 mg at bedtime for sleep along with Lyrica 150 mg twice a day from the pain clinic.  She gets routine eye care, dental care, BSE monthly, annual mammography, colonoscopy 2014. She gets colonoscopy every 5 years because her father had colon cancer. Vaccination history .Marland Kitchen... Tetanus booster 2008 seasonal flu shot today.  Weight was up to 195 pounds in May she's down to 189 with change in diet. She does not exercise on a regular basis.  Again she sees her GYN yearly she's had her uterus and ovaries removed.  .BP 110/68 (BP Location: Left Arm, Patient Position: Sitting, Cuff Size: Normal)   Pulse 66   Ht 5\' 5"  (1.651 m)   Wt 189 lb 3.2 oz (85.8 kg)   BMI 31.48 kg/m  Examination of the HEENT were negative except a left thyroid nodule this been present for many years. No carotid bruits cardiopulmonary exam normal breast exam normal except for scars around each areola from previous reduction mammoplasty. Abdominal exam negative pelvic and rectal by GYN therefore not repeated extremities normal skin normal peripheral pulses normal  #1 hypertension at goal..... Continue current therapy  #2 hyperlipidemia...Marland KitchenMarland KitchenMarland Kitchen not at goal......... increase Zocor from Monday Wednesday Friday to daily  #3 overweight..........Marland Kitchen prescribed diet exercise and  weight loss  Chronic pain.....Marland Kitchen continue Lyrica and followed by pain clinic  #5 left thyroid nodule benign......... continue Synthroid follow-up by GYN  #6 status post TAH and BSO for nonmalignant reasons  EKG done because of a history of hypertension and hyperlipidemia. Previous EKG showed sinus bradycardia. Repeat EKG unchanged normal

## 2016-09-14 ENCOUNTER — Telehealth: Payer: Self-pay | Admitting: Family Medicine

## 2016-09-14 NOTE — Telephone Encounter (Signed)
Really sorry, but the only documentation that I see is from the order for the bone density and that is how it is coded.  I can not change dx due to this.  Dawn

## 2016-09-14 NOTE — Telephone Encounter (Signed)
Patient is calling stating that the Bone Dexa she had 04/25/2013 was supposed to be a screening.  States she has contested the charge and has been disputing ever since.  I do see where the Z00.00 was used as a secondary, however I also do NOT see documentation stating that patient has ever had an abnormal finding on any previous Dexa.  Can you please look to see if there is any way to use the Z00.00 as the primary to re-bill.

## 2016-11-24 ENCOUNTER — Encounter: Payer: Self-pay | Admitting: Family Medicine

## 2017-03-19 ENCOUNTER — Other Ambulatory Visit: Payer: Self-pay | Admitting: Family Medicine

## 2017-03-30 ENCOUNTER — Other Ambulatory Visit: Payer: Self-pay | Admitting: Family Medicine

## 2017-04-20 ENCOUNTER — Telehealth: Payer: Self-pay

## 2017-04-20 NOTE — Telephone Encounter (Signed)
Our office  received a surgical clearance form from [facility] Scoli[facility] specialist for pt surgery which is scheduled for 04/23/2017, The forms needs to be signed by Dr Sherren Mocha and faxed back to the Spine specialist with her last office note, last lab work and last EKG. Notified Dr Sherren Mocha who okayed the request for the surgical clearance. The forms have been (stamped) signed per Dr Sherren Mocha and faxed back to Gabriela Eves with Spine specialist office and confirmed that the forms have been received.

## 2017-06-28 ENCOUNTER — Other Ambulatory Visit: Payer: Self-pay | Admitting: Endocrinology

## 2017-06-28 DIAGNOSIS — E041 Nontoxic single thyroid nodule: Secondary | ICD-10-CM

## 2017-07-02 ENCOUNTER — Other Ambulatory Visit: Payer: Self-pay | Admitting: Endocrinology

## 2017-07-02 DIAGNOSIS — E041 Nontoxic single thyroid nodule: Secondary | ICD-10-CM

## 2017-07-31 ENCOUNTER — Other Ambulatory Visit: Payer: Federal, State, Local not specified - PPO

## 2017-08-03 ENCOUNTER — Ambulatory Visit
Admission: RE | Admit: 2017-08-03 | Discharge: 2017-08-03 | Disposition: A | Discharge status: Home / Self Care | Payer: Federal, State, Local not specified - PPO | Source: Ambulatory Visit | Attending: Endocrinology | Admitting: Endocrinology

## 2017-08-03 DIAGNOSIS — E041 Nontoxic single thyroid nodule: Secondary | ICD-10-CM

## 2017-09-28 ENCOUNTER — Other Ambulatory Visit: Payer: Self-pay | Admitting: Family Medicine

## 2017-12-02 ENCOUNTER — Encounter: Payer: Self-pay | Admitting: Family Medicine

## 2017-12-31 ENCOUNTER — Encounter: Payer: Self-pay | Admitting: Gastroenterology

## 2018-01-17 ENCOUNTER — Encounter: Payer: Self-pay | Admitting: Gastroenterology

## 2018-02-07 DIAGNOSIS — M65931 Unspecified synovitis and tenosynovitis, right forearm: Secondary | ICD-10-CM | POA: Insufficient documentation

## 2018-02-07 DIAGNOSIS — M659 Synovitis and tenosynovitis, unspecified: Secondary | ICD-10-CM | POA: Insufficient documentation

## 2018-02-15 ENCOUNTER — Encounter: Payer: Self-pay | Admitting: Gastroenterology

## 2018-02-15 ENCOUNTER — Ambulatory Visit (AMBULATORY_SURGERY_CENTER): Payer: Self-pay | Admitting: *Deleted

## 2018-02-15 VITALS — Ht 66.4 in | Wt 195.0 lb

## 2018-02-15 DIAGNOSIS — Z86010 Personal history of colonic polyps: Secondary | ICD-10-CM

## 2018-02-15 DIAGNOSIS — Z800 Family history of malignant neoplasm of digestive organs: Secondary | ICD-10-CM

## 2018-02-15 MED ORDER — NA SULFATE-K SULFATE-MG SULF 17.5-3.13-1.6 GM/177ML PO SOLN: ORAL | 0 refills | Status: DC

## 2018-02-15 NOTE — Progress Notes (Signed)
Patient denies any allergies to eggs or soy. Patient denies any problems with anesthesia/sedation. Patient denies any oxygen use at home. Patient denies taking any diet/weight loss medications or blood thinners. EMMI education offered, pt declined.  

## 2018-03-01 ENCOUNTER — Ambulatory Visit (AMBULATORY_SURGERY_CENTER): Payer: Federal, State, Local not specified - PPO | Admitting: Gastroenterology

## 2018-03-01 ENCOUNTER — Encounter: Payer: Self-pay | Admitting: Gastroenterology

## 2018-03-01 VITALS — BP 115/64 | HR 70 | Temp 96.2°F | Resp 12 | Ht 66.0 in | Wt 195.0 lb

## 2018-03-01 DIAGNOSIS — K635 Polyp of colon: Secondary | ICD-10-CM

## 2018-03-01 DIAGNOSIS — D122 Benign neoplasm of ascending colon: Secondary | ICD-10-CM | POA: Diagnosis not present

## 2018-03-01 DIAGNOSIS — D125 Benign neoplasm of sigmoid colon: Secondary | ICD-10-CM

## 2018-03-01 DIAGNOSIS — Z86010 Personal history of colonic polyps: Secondary | ICD-10-CM

## 2018-03-01 MED ORDER — SODIUM CHLORIDE 0.9 % IV SOLN: 500.0000 mL | Freq: Once | INTRAVENOUS | Status: DC

## 2018-03-01 NOTE — Op Note (Signed)
[place] Patient Name: Brandi Spence Procedure Date: 03/01/2018 11:15 AM MRN: [id number] Endoscopist: Mauri Pole , MD Age: 72 Referring MD:  Date of Birth: [birth year] Gender: Female Account #: [id number] Procedure:                Colonoscopy Indications:              High risk colon cancer surveillance: Personal                            history of colonic polyps, Surveillance: Personal                            history of adenomatous polyps on last colonoscopy 5                            years ago, High risk colon cancer surveillance:                            Personal history of adenoma less than 10 mm in size Medicines:                Monitored Anesthesia Care Procedure:                Pre-Anesthesia Assessment:                           - Prior to the procedure, a History and Physical                            was performed, and patient medications and                            allergies were reviewed. The patient's tolerance of                            previous anesthesia was also reviewed. The risks                            and benefits of the procedure and the sedation                            options and risks were discussed with the patient.                            All questions were answered, and informed consent                            was obtained. Prior Anticoagulants: The patient has                            taken no previous anticoagulant or antiplatelet                            agents. ASA Grade Assessment: II - A patient with  mild systemic disease. After reviewing the risks                            and benefits, the patient was deemed in                            satisfactory condition to undergo the procedure.                           After obtaining informed consent, the colonoscope                            was passed under direct vision. Throughout the                             procedure, the patient's blood pressure, pulse, and                            oxygen saturations were monitored continuously. The                            Model PCF-[id number]) scope was introduced                            through the anus and advanced to the the cecum,                            identified by appendiceal orifice and ileocecal                            valve. The colonoscopy was performed without                            difficulty. The patient tolerated the procedure                            well. The quality of the bowel preparation was                            excellent. The ileocecal valve, appendiceal                            orifice, and rectum were photographed. Scope In: 11:23:51 AM Scope Out: 11:41:36 AM Scope Withdrawal Time: 0 hours 10 minutes 43 seconds  Total Procedure Duration: 0 hours 17 minutes 45 seconds  Findings:                 The perianal and digital rectal examinations were                            normal.                           A 1 mm polyp was found in the ascending colon. The  polyp was sessile. The polyp was removed with a                            cold biopsy forceps. Resection and retrieval were                            complete.                           A 5 mm polyp was found in the sigmoid colon. The                            polyp was sessile. The polyp was removed with a                            cold snare. Resection and retrieval were complete.                           A few small-mouthed diverticula were found in the                            sigmoid colon.                           Non-bleeding internal hemorrhoids were found during                            retroflexion. The hemorrhoids were small. Complications:            No immediate complications. Impression:               - One 1 mm polyp in the ascending colon, removed                            with a cold biopsy  forceps. Resected and retrieved.                           - One 5 mm polyp in the sigmoid colon, removed with                            a cold snare. Resected and retrieved.                           - Diverticulosis in the sigmoid colon.                           - Non-bleeding internal hemorrhoids. Recommendation:           - Patient has a contact number available for                            emergencies. The signs and symptoms of potential                            delayed complications were discussed with the  patient. Return to normal activities tomorrow.                            Written discharge instructions were provided to the                            patient.                           - Resume previous diet.                           - Continue present medications.                           - Await pathology results.                           - Repeat colonoscopy in 5-10 years for surveillance                            based on pathology results. Mauri Pole, MD 03/01/2018 11:55:11 AM This report has been signed electronically.

## 2018-03-01 NOTE — Progress Notes (Signed)
Report given to PACU, vss 

## 2018-03-01 NOTE — Progress Notes (Signed)
Called to room to assist during endoscopic procedure.  Patient ID and intended procedure confirmed with present staff. Received instructions for my participation in the procedure from the performing physician.  

## 2018-03-01 NOTE — Patient Instructions (Signed)
YOU HAD AN ENDOSCOPIC PROCEDURE TODAY AT [facility]:   Refer to the procedure report that was given to you for any specific questions about what was found during the examination.  If the procedure report does not answer your questions, please call your gastroenterologist to clarify.  If you requested that your care partner not be given the details of your procedure findings, then the procedure report has been included in a sealed envelope for you to review at your convenience later.  YOU SHOULD EXPECT: Some feelings of bloating in the abdomen. Passage of more gas than usual.  Walking can help get rid of the air that was put into your GI tract during the procedure and reduce the bloating. If you had a lower endoscopy (such as a colonoscopy or flexible sigmoidoscopy) you may notice spotting of blood in your stool or on the toilet paper. If you underwent a bowel prep for your procedure, you may not have a normal bowel movement for a few days.  Please Note:  You might notice some irritation and congestion in your nose or some drainage.  This is from the oxygen used during your procedure.  There is no need for concern and it should clear up in a day or so.  SYMPTOMS TO REPORT IMMEDIATELY:   Following lower endoscopy (colonoscopy or flexible sigmoidoscopy):  Excessive amounts of blood in the stool  Significant tenderness or worsening of abdominal pains  Swelling of the abdomen that is new, acute  Fever of 100F or higher   Following upper endoscopy (EGD)  Vomiting of blood or coffee ground material  New chest pain or pain under the shoulder blades  Painful or persistently difficult swallowing  New shortness of breath  Fever of 100F or higher  Black, tarry-looking stools  For urgent or emergent issues, a gastroenterologist can be reached at any hour by calling [phone].   DIET:  We do recommend a small meal at first, but then you may proceed to your regular diet.  Drink  plenty of fluids but you should avoid alcoholic beverages for 24 hours.  ACTIVITY:  You should plan to take it easy for the rest of today and you should NOT DRIVE or use heavy machinery until tomorrow (because of the sedation medicines used during the test).    FOLLOW UP: Our staff will call the number listed on your records the next business day following your procedure to check on you and address any questions or concerns that you may have regarding the information given to you following your procedure. If we do not reach you, we will leave a message.  However, if you are feeling well and you are not experiencing any problems, there is no need to return our call.  We will assume that you have returned to your regular daily activities without incident.  If any biopsies were taken you will be contacted by phone or by letter within the next 1-3 weeks.  Please call us at [phone] if you have not heard about the biopsies in 3 weeks.   Await for biopsy results Polyps (handout given) Diverticulosis (handout given) Hemorrhoids (handout given)  SIGNATURES/CONFIDENTIALITY: You and/or your care partner have signed paperwork which will be entered into your electronic medical record.  These signatures attest to the fact that that the information above on your After Visit Summary has been reviewed and is understood.  Full responsibility of the confidentiality of this discharge information lies with you and/or your  care-partner.

## 2018-03-04 ENCOUNTER — Telehealth: Payer: Self-pay | Admitting: *Deleted

## 2018-03-04 NOTE — Telephone Encounter (Signed)
  Follow up Call-  Call back number 03/01/2018  Post procedure Call Back phone  # [phone]  Permission to leave phone message Yes  Some recent data might be hidden     Patient questions:  Message left to call us if necessary.

## 2018-03-04 NOTE — Telephone Encounter (Signed)
  Follow up Call-  Call back number 03/01/2018  Post procedure Call Back phone  # [phone]  Permission to leave phone message Yes  Some recent data might be hidden     Patient questions:  Do you have a fever, pain , or abdominal swelling? No. Pain Score  0 *  Have you tolerated food without any problems? Yes.    Have you been able to return to your normal activities? Yes.    Do you have any questions about your discharge instructions: Diet   No. Medications  No. Follow up visit  No.  Do you have questions or concerns about your Care? No.  Actions: * If pain score is 4 or above: No action needed, pain <4.

## 2018-03-11 ENCOUNTER — Encounter: Payer: Self-pay | Admitting: Gastroenterology

## 2018-03-28 ENCOUNTER — Encounter: Payer: Self-pay | Admitting: Internal Medicine

## 2018-03-28 ENCOUNTER — Ambulatory Visit (INDEPENDENT_AMBULATORY_CARE_PROVIDER_SITE_OTHER): Payer: Medicare Other | Admitting: Internal Medicine

## 2018-03-28 VITALS — BP 110/80 | HR 65 | Temp 98.3°F | Ht 66.0 in | Wt 200.0 lb

## 2018-03-28 DIAGNOSIS — G5793 Unspecified mononeuropathy of bilateral lower limbs: Secondary | ICD-10-CM | POA: Diagnosis not present

## 2018-03-28 DIAGNOSIS — R7302 Impaired glucose tolerance (oral): Secondary | ICD-10-CM

## 2018-03-28 DIAGNOSIS — G894 Chronic pain syndrome: Secondary | ICD-10-CM | POA: Diagnosis not present

## 2018-03-28 DIAGNOSIS — Z0000 Encounter for general adult medical examination without abnormal findings: Secondary | ICD-10-CM | POA: Diagnosis not present

## 2018-03-28 DIAGNOSIS — E039 Hypothyroidism, unspecified: Secondary | ICD-10-CM | POA: Diagnosis not present

## 2018-03-28 DIAGNOSIS — E7800 Pure hypercholesterolemia, unspecified: Secondary | ICD-10-CM | POA: Diagnosis not present

## 2018-03-28 DIAGNOSIS — I10 Essential (primary) hypertension: Secondary | ICD-10-CM | POA: Diagnosis not present

## 2018-03-28 LAB — LIPID PANEL
Cholesterol: 204 mg/dL — ABNORMAL HIGH (ref 0–200)
HDL: 55.8 mg/dL (ref >39.00)
LDL Cholesterol: 114 mg/dL — ABNORMAL HIGH (ref 0–99)
NonHDL: 148.28
Total CHOL/HDL Ratio: 4
Triglycerides: 171 mg/dL — ABNORMAL HIGH (ref 0.0–149.0)
VLDL: 34.2 mg/dL (ref 0.0–40.0)

## 2018-03-28 LAB — CBC WITH DIFFERENTIAL/PLATELET
Basophils Absolute: 0 10*3/uL (ref 0.0–0.1)
Basophils Relative: 1 % (ref 0.0–3.0)
Eosinophils Absolute: 0.1 10*3/uL (ref 0.0–0.7)
Eosinophils Relative: 2 % (ref 0.0–5.0)
HCT: 41.3 % (ref 36.0–46.0)
Hemoglobin: 14.1 g/dL (ref 12.0–15.0)
Lymphocytes Relative: 41.1 % (ref 12.0–46.0)
Lymphs Abs: 1.7 10*3/uL (ref 0.7–4.0)
MCHC: 34.2 g/dL (ref 30.0–36.0)
MCV: 89.1 fl (ref 78.0–100.0)
Monocytes Absolute: 0.5 10*3/uL (ref 0.1–1.0)
Monocytes Relative: 11.3 % (ref 3.0–12.0)
Neutro Abs: 1.8 10*3/uL (ref 1.4–7.7)
Neutrophils Relative %: 44.6 % (ref 43.0–77.0)
Platelets: 270 10*3/uL (ref 150.0–400.0)
RBC: 4.64 Mil/uL (ref 3.87–5.11)
RDW: 13.3 % (ref 11.5–15.5)
WBC: 4.1 10*3/uL (ref 4.0–10.5)

## 2018-03-28 LAB — COMPREHENSIVE METABOLIC PANEL
ALT: 17 U/L (ref 0–35)
AST: 17 U/L (ref 0–37)
Albumin: 4.4 g/dL (ref 3.5–5.2)
Alkaline Phosphatase: 65 U/L (ref 39–117)
BUN: 15 mg/dL (ref 6–23)
CO2: 31 mEq/L (ref 19–32)
Calcium: 9.6 mg/dL (ref 8.4–10.5)
Chloride: 99 mEq/L (ref 96–112)
Creatinine, Ser: 0.91 mg/dL (ref 0.40–1.20)
GFR: 60.58 mL/min (ref >60.00)
Glucose, Bld: 100 mg/dL — ABNORMAL HIGH (ref 70–99)
Potassium: 4.5 mEq/L (ref 3.5–5.1)
Sodium: 137 mEq/L (ref 135–145)
Total Bilirubin: 0.7 mg/dL (ref 0.2–1.2)
Total Protein: 6.7 g/dL (ref 6.0–8.3)

## 2018-03-28 LAB — HEMOGLOBIN A1C: Hgb A1c MFr Bld: 6 % (ref 4.6–6.5)

## 2018-03-28 LAB — VITAMIN D 25 HYDROXY (VIT D DEFICIENCY, FRACTURES): VITD: 38.6 ng/mL (ref 30.00–100.00)

## 2018-03-28 LAB — TSH: TSH: 2.09 u[IU]/mL (ref 0.35–4.50)

## 2018-03-28 MED ORDER — SIMVASTATIN 20 MG PO TABS: 20.0000 mg | ORAL_TABLET | Freq: Every day | ORAL | 1 refills | Status: DC

## 2018-03-28 MED ORDER — LISINOPRIL-HYDROCHLOROTHIAZIDE 20-25 MG PO TABS: ORAL_TABLET | ORAL | 1 refills | Status: DC

## 2018-03-28 NOTE — Patient Instructions (Signed)
-It was nice meeting you today!  -Lab work today. Will notify you when results are available.  -Schedule follow up in 6 months.   Preventive Care 9 Years and Older, Female Preventive care refers to lifestyle choices and visits with your health care provider that can promote health and wellness. What does preventive care include?  A yearly physical exam. This is also called an annual well check.  Dental exams once or twice a year.  Routine eye exams. Ask your health care provider how often you should have your eyes checked.  Personal lifestyle choices, including: ? Daily care of your teeth and gums. ? Regular physical activity. ? Eating a healthy diet. ? Avoiding tobacco and drug use. ? Limiting alcohol use. ? Practicing safe sex. ? Taking low-dose aspirin every day. ? Taking vitamin and mineral supplements as recommended by your health care provider. What happens during an annual well check? The services and screenings done by your health care provider during your annual well check will depend on your age, overall health, lifestyle risk factors, and family history of disease. Counseling Your health care provider may ask you questions about your:  Alcohol use.  Tobacco use.  Drug use.  Emotional well-being.  Home and relationship well-being.  Sexual activity.  Eating habits.  History of falls.  Memory and ability to understand (cognition).  Work and work Statistician.  Reproductive health.  Screening You may have the following tests or measurements:  Height, weight, and BMI.  Blood pressure.  Lipid and cholesterol levels. These may be checked every 5 years, or more frequently if you are over 74 years old.  Skin check.  Lung cancer screening. You may have this screening every year starting at age 74 if you have a 30-pack-year history of smoking and currently smoke or have quit within the past 15 years.  Colorectal cancer screening. All adults should  have this screening starting at age 74 and continuing until age 74. You will have tests every 1-10 years, depending on your results and the type of screening test. People at increased risk should start screening at an earlier age. Screening tests may include: ? Guaiac-based fecal occult blood testing. ? Fecal immunochemical test (FIT). ? Stool DNA test. ? Virtual colonoscopy. ? Sigmoidoscopy. During this test, a flexible tube with a tiny camera (sigmoidoscope) is used to examine your rectum and lower colon. The sigmoidoscope is inserted through your anus into your rectum and lower colon. ? Colonoscopy. During this test, a long, thin, flexible tube with a tiny camera (colonoscope) is used to examine your entire colon and rectum.  Hepatitis C blood test.  Hepatitis B blood test.  Sexually transmitted disease (STD) testing.  Diabetes screening. This is done by checking your blood sugar (glucose) after you have not eaten for a while (fasting). You may have this done every 1-3 years.  Bone density scan. This is done to screen for osteoporosis. You may have this done starting at age 74.  Mammogram. This may be done every 1-2 years. Talk to your health care provider about how often you should have regular mammograms. Talk with your health care provider about your test results, treatment options, and if necessary, the need for more tests. Vaccines Your health care provider may recommend certain vaccines, such as:  Influenza vaccine. This is recommended every year.  Tetanus, diphtheria, and acellular pertussis (Tdap, Td) vaccine. You may need a Td booster every 10 years.  Varicella vaccine. You may need this if you  have not been vaccinated.  Zoster vaccine. You may need this after age 74.  Measles, mumps, and rubella (MMR) vaccine. You may need at least one dose of MMR if you were born in 1957 or later. You may also need a second dose.  Pneumococcal 13-valent conjugate (PCV13) vaccine. One  dose is recommended after age 26.  Pneumococcal polysaccharide (PPSV23) vaccine. One dose is recommended after age 74.  Meningococcal vaccine. You may need this if you have certain conditions.  Hepatitis A vaccine. You may need this if you have certain conditions or if you travel or work in places where you may be exposed to hepatitis A.  Hepatitis B vaccine. You may need this if you have certain conditions or if you travel or work in places where you may be exposed to hepatitis B.  Haemophilus influenzae type b (Hib) vaccine. You may need this if you have certain conditions. Talk to your health care provider about which screenings and vaccines you need and how often you need them. This information is not intended to replace advice given to you by your health care provider. Make sure you discuss any questions you have with your health care provider. Document Released: 03/19/2015 Document Revised: 04/12/2017 Document Reviewed: 12/22/2014 Elsevier Interactive Patient Education  2019 [employer].

## 2018-03-28 NOTE — Progress Notes (Signed)
Established Patient Office Visit     CC/Reason for Visit: Medicare wellness visit and preventive exam  HPI: Brandi Spence is a 73 y.o. female who is coming in today for the above mentioned reasons. Past Medical History is significant for: Hypertension, hyperlipidemia, hypothyroidism, peripheral neuropathy, chronic pain syndrome on Lyrica and hydrocodone as prescribed by Dr. Hardin Negus with pain management.  She recently retired and spent the last few months taking care of her mother who passed away in Jan 25, 2023.  She remains physically active, is trying to find things to do during her retirement, she has routine eye and dental care.  She is up-to-date on age-appropriate cancer screening.  She last saw her GYN 2 years ago, she was told by them that she no longer needs to do Pap smears.  She is due for tetanus booster which she will obtain at the drugstore.  We did discuss shingles vaccination, she will consider.  Subsequent Medicare wellness visit   1. Risk factors, based on past  M,S,F -coronary artery disease risk factors include hyperlipidemia, hypertension, impaired glucose tolerance, and moderate obesity   2.  Physical activities: She walks 3 times a week for 30 minutes   3.  Depression/mood:  She is upbeat and positive despite the recent death of her mother   29.  Hearing:  She is under the care of Dr. Thornell Mule with ENT, was recently diagnosed with Mnire's disease and was given intra-auricular steroids, after these has had no further issues with hearing.   5.  ADL's: Independent   6.  Fall risk:  Low   7.  Home safety: No problems identified   8.  Height weight, and visual acuity: Height and weight as documented in chart, she follows with eye doctor yearly for visual acuity purposes   9.  Counseling:  We have discussed lifestyle modifications, increased physical activity, weight loss as pertains to reducing cardiovascular disease risk factors   10. Lab orders based on risk  factors: Laboratory update will be reviewed   11. Referral :  None   12. Care plan:  Lifestyle modifications   13. Cognitive assessment:  No cognitive impairment   14. Screening: Patient provided with a written and personalized 5-10 year screening schedule in the AVS.   yes   15. Provider List Update:   PCP, ENT, GYN    Past Medical/Surgical History: Past Medical History:  Diagnosis Date  . Hyperlipidemia   . Hypertension   . Hypothyroidism   . Kidney stones   . Neuropathy    feet    Past Surgical History:  Procedure Laterality Date  . ABDOMINAL HYSTERECTOMY  2005  . BREAST ENHANCEMENT SURGERY Bilateral 2000  . BUNIONECTOMY Right 2013  . CARPAL TUNNEL RELEASE Right 1997  . COLONOSCOPY  01/01/2013  . 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 KNEE ARTHROSCOPY Right 2006   2012  . PAROTID GLAND TUMOR EXCISION  2009  . POLYPECTOMY    . SPINAL CORD STIMULATOR IMPLANT Left 2012  . TARSAL TUNNEL RELEASE Right 2005    Social History:  reports that she quit smoking about 37 years ago. She has never used smokeless tobacco. She reports current alcohol use of about 1.0 standard drinks of alcohol per week. She reports that she does not use drugs.  Allergies: Allergies  Allergen Reactions  . Prednisone Other (See Comments)    Indigestion, dizziness    Family History:  Family History  Problem Relation  Age of Onset  . Colon cancer Father 31  . Colon polyps Father   . Esophageal cancer Neg Hx   . Rectal cancer Neg Hx   . Stomach cancer Neg Hx      Current Outpatient Medications:  .  aspirin 81 MG EC tablet, Take 81 mg by mouth daily.  , Disp: , Rfl:  .  cetirizine (ZYRTEC) 10 MG tablet, Take 10 mg by mouth daily., Disp: , Rfl:  .  Cholecalciferol (VITAMIN D3 PO), Take 2,000 Units by mouth daily., Disp: , Rfl:  .  diphenhydrAMINE HCl, Sleep, (SLEEP-AID) 50 MG CAPS, Take by mouth., Disp: , Rfl:  .  docusate sodium (COLACE) 100 MG capsule, Take 200  mg by mouth daily., Disp: , Rfl:  .  FIBER PO, Take 500 mg by mouth daily., Disp: , Rfl:  .  HYDROcodone-acetaminophen (NORCO/VICODIN) 5-325 MG tablet, Take 1 tablet by mouth every 6 (six) hours as needed for moderate pain. Pain management - Dr Hardin Negus, Disp: , Rfl:  .  levothyroxine (SYNTHROID, LEVOTHROID) 75 MCG tablet, Take 75 mcg by mouth daily before breakfast., Disp: , Rfl:  .  lisinopril-hydrochlorothiazide (PRINZIDE,ZESTORETIC) 20-25 MG tablet, TAKE 1/2 TABLET EVERY DAY, Disp: 90 tablet, Rfl: 1 .  potassium chloride (MICRO-K) 10 MEQ CR capsule, potassium chloride ER 10 mEq capsule,extended release, Disp: , Rfl:  .  pregabalin (LYRICA) 150 MG capsule, Take 150 mg by mouth 2 (two) times daily. Pain management, Disp: , Rfl:  .  simvastatin (ZOCOR) 20 MG tablet, Take 1 tablet (20 mg total) by mouth at bedtime., Disp: 90 tablet, Rfl: 1 .  triamterene-hydrochlorothiazide (DYAZIDE) 37.5-25 MG capsule, triamterene 37.5 mg-hydrochlorothiazide 25 mg capsule, Disp: , Rfl:   Review of Systems:  Constitutional: Denies fever, chills, diaphoresis, appetite change and fatigue.  HEENT: Denies photophobia, eye pain, redness, hearing loss, ear pain, congestion, sore throat, rhinorrhea, sneezing, mouth sores, trouble swallowing, neck pain, neck stiffness and tinnitus.   Respiratory: Denies SOB, DOE, cough, chest tightness,  and wheezing.   Cardiovascular: Denies chest pain, palpitations and leg swelling.  Gastrointestinal: Denies nausea, vomiting, abdominal pain, diarrhea, constipation, blood in stool and abdominal distention.  Genitourinary: Denies dysuria, urgency, frequency, hematuria, flank pain and difficulty urinating.  Endocrine: Denies: hot or cold intolerance, sweats, changes in hair or nails, polyuria, polydipsia. Musculoskeletal: Denies myalgias, back pain, joint swelling, arthralgias and gait problem.  Skin: Denies pallor, rash and wound.  Neurological: Denies dizziness, seizures, syncope,  weakness, light-headedness, numbness and headaches.  Hematological: Denies adenopathy. Easy bruising, personal or family bleeding history  Psychiatric/Behavioral: Denies suicidal ideation, mood changes, confusion, nervousness, sleep disturbance and agitation    Physical Exam: Vitals:   03/28/18 0936  BP: 110/80  Pulse: 65  Temp: 98.3 F (36.8 C)  TempSrc: Oral  SpO2: 98%  Weight: 200 lb (90.7 kg)  Height: 5' 6"  (1.676 m)    Body mass index is 32.28 kg/m.   Constitutional: NAD, calm, comfortable Eyes: PERRL, lids and conjunctivae normal ENMT: Mucous membranes are moist. Posterior pharynx clear of any exudate or lesions. Normal dentition. Tympanic membrane is pearly white, no erythema or bulging. Neck: normal, supple, no masses, no thyromegaly Respiratory: clear to auscultation bilaterally, no wheezing, no crackles. Normal respiratory effort. No accessory muscle use.  Cardiovascular: Regular rate and rhythm, no murmurs / rubs / gallops. No extremity edema. 2+ pedal pulses. No carotid bruits.  Abdomen: no tenderness, no masses palpated. No hepatosplenomegaly. Bowel sounds positive.  Musculoskeletal: no clubbing / cyanosis. No joint  deformity upper and lower extremities. Good ROM, no contractures. Normal muscle tone.  Skin: no rashes, lesions, ulcers. No induration Neurologic: CN 2-12 grossly intact. Sensation intact, DTR normal. Strength 5/5 in all 4.  Psychiatric: Normal judgment and insight. Alert and oriented x 3. Normal mood.    Impression and Plan:  Routine general medical examination at a health care facility -Lab work requested today, -We will obtain tetanus vaccination at drugstore. -She is up-to-date on eye and dental care. -She is up-to-date on age-appropriate cancer screening: Had mammogram in August 2019 and a colonoscopy in December 2019 was told to return in 5 years.  Essential hypertension  -Well-controlled, refilled lisinopril/hydrochlorothiazide  today.  Hypercholesterolemia  -Last LDL was 137 in 2017, is on simvastatin 20 mg. -Lipids will be requested today.  IGT (impaired glucose tolerance) - -A1c today, last A1c was 6.0 in May 2019. -Counseled on lifestyle modifications.  Neuropathy of both feet -Remains on Lyrica as prescribed by the pain clinic.  Hypothyroidism, unspecified type  -TSH today, continue Synthroid 75 mcg.  Chronic pain syndrome -Sees a pain clinic and is prescribed hydrocodone and Lyrica through them.     Patient Instructions  -It was nice meeting you today!  -Lab work today. Will notify you when results are available.  -Schedule follow up in 6 months.   Preventive Care 107 Years and Older, Female Preventive care refers to lifestyle choices and visits with your health care provider that can promote health and wellness. What does preventive care include?  A yearly physical exam. This is also called an annual well check.  Dental exams once or twice a year.  Routine eye exams. Ask your health care provider how often you should have your eyes checked.  Personal lifestyle choices, including: ? Daily care of your teeth and gums. ? Regular physical activity. ? Eating a healthy diet. ? Avoiding tobacco and drug use. ? Limiting alcohol use. ? Practicing safe sex. ? Taking low-dose aspirin every day. ? Taking vitamin and mineral supplements as recommended by your health care provider. What happens during an annual well check? The services and screenings done by your health care provider during your annual well check will depend on your age, overall health, lifestyle risk factors, and family history of disease. Counseling Your health care provider may ask you questions about your:  Alcohol use.  Tobacco use.  Drug use.  Emotional well-being.  Home and relationship well-being.  Sexual activity.  Eating habits.  History of falls.  Memory and ability to understand (cognition).  Work  and work Statistician.  Reproductive health.  Screening You may have the following tests or measurements:  Height, weight, and BMI.  Blood pressure.  Lipid and cholesterol levels. These may be checked every 5 years, or more frequently if you are over 68 years old.  Skin check.  Lung cancer screening. You may have this screening every year starting at age 31 if you have a 30-pack-year history of smoking and currently smoke or have quit within the past 15 years.  Colorectal cancer screening. All adults should have this screening starting at age 20 and continuing until age 47. You will have tests every 1-10 years, depending on your results and the type of screening test. People at increased risk should start screening at an earlier age. Screening tests may include: ? Guaiac-based fecal occult blood testing. ? Fecal immunochemical test (FIT). ? Stool DNA test. ? Virtual colonoscopy. ? Sigmoidoscopy. During this test, a flexible tube with a tiny  camera (sigmoidoscope) is used to examine your rectum and lower colon. The sigmoidoscope is inserted through your anus into your rectum and lower colon. ? Colonoscopy. During this test, a long, thin, flexible tube with a tiny camera (colonoscope) is used to examine your entire colon and rectum.  Hepatitis C blood test.  Hepatitis B blood test.  Sexually transmitted disease (STD) testing.  Diabetes screening. This is done by checking your blood sugar (glucose) after you have not eaten for a while (fasting). You may have this done every 1-3 years.  Bone density scan. This is done to screen for osteoporosis. You may have this done starting at age 77.  Mammogram. This may be done every 1-2 years. Talk to your health care provider about how often you should have regular mammograms. Talk with your health care provider about your test results, treatment options, and if necessary, the need for more tests. Vaccines Your health care provider may  recommend certain vaccines, such as:  Influenza vaccine. This is recommended every year.  Tetanus, diphtheria, and acellular pertussis (Tdap, Td) vaccine. You may need a Td booster every 10 years.  Varicella vaccine. You may need this if you have not been vaccinated.  Zoster vaccine. You may need this after age 30.  Measles, mumps, and rubella (MMR) vaccine. You may need at least one dose of MMR if you were born in 1957 or later. You may also need a second dose.  Pneumococcal 13-valent conjugate (PCV13) vaccine. One dose is recommended after age 4.  Pneumococcal polysaccharide (PPSV23) vaccine. One dose is recommended after age 24.  Meningococcal vaccine. You may need this if you have certain conditions.  Hepatitis A vaccine. You may need this if you have certain conditions or if you travel or work in places where you may be exposed to hepatitis A.  Hepatitis B vaccine. You may need this if you have certain conditions or if you travel or work in places where you may be exposed to hepatitis B.  Haemophilus influenzae type b (Hib) vaccine. You may need this if you have certain conditions. Talk to your health care provider about which screenings and vaccines you need and how often you need them. This information is not intended to replace advice given to you by your health care provider. Make sure you discuss any questions you have with your health care provider. Document Released: 03/19/2015 Document Revised: 04/12/2017 Document Reviewed: 12/22/2014 Elsevier Interactive Patient Education  2019 [place], MD [facility]

## 2018-04-21 ENCOUNTER — Encounter: Payer: Self-pay | Admitting: Internal Medicine

## 2018-04-29 ENCOUNTER — Telehealth: Payer: Self-pay

## 2018-04-29 NOTE — Telephone Encounter (Signed)
She was new to Medicare , therefore this should have been a Welcome to [employer] with the provider, but she does have [employer] as secondary, so they may pick up the CPE , Medicare may deny the AWV since it should have been the Welcome to Medicare. This was just refiled, so we will have to wait and see what happens.

## 2018-04-29 NOTE — Telephone Encounter (Signed)
Author phoned pt. to review nature of awv. Pt. stated she was recently charged for both a medicare wellness visit and physical for 2022-04-08 visit with PCP, and did not know why she received two bills. Pt. new to medicare, and medicare serves as Chartered certified accountant. Pt. was told that wellness visits were covered by medicare. Author explained difference between initial medicare wellness visits with provider and subsequent visits with health coach after first year of having medicare part B, but could not explain why 08-Apr-2022 visit was coded for both medicare wellness and yearly preventative. Routed to PCP and billing department to advise.   Copied from [place] [id number]. Topic: General - Other >> Apr 29, 2018 11:21 AM Windy Kalata wrote: Reason for CRM: Patient is calling asking the difference between a Eleva and a regular CPE?  Best call back [phone] >> Apr 29, 2018 11:26 AM Windy Kalata wrote: Patient states that she talked to billing and it was advised that her Insurances are in the wrong order, she should have Medicare as primary and BCBS secondary >> Apr 29, 2018  4:05 PM Cox, Melburn Hake, CMA wrote: Insurances have been updated.

## 2018-04-30 NOTE — Telephone Encounter (Signed)
Unfortunately, there is not, you would need to look at the [facility] insurance card to know when the effective dates for Medicare Part B- Sheena and I were talking about this,  I could show your CMA  Possibly a quick way  This was a rare instance

## 2018-04-30 NOTE — Telephone Encounter (Signed)
Is there a way to easily tell from looking at the chart when it is the initial vs the subsequent AWV? If they are 62, no brainer, but otherwise difficult to discern.

## 2018-05-08 NOTE — Telephone Encounter (Signed)
Author phoned pt. to update on the claim being re-filed. No answer, author left detailed VM stating that claim was being re-filed but personally have not heard anything from billing since. Will wait to hear back from pt. Is there is need for follow-up.

## 2018-05-22 DIAGNOSIS — Z79891 Long term (current) use of opiate analgesic: Secondary | ICD-10-CM | POA: Diagnosis not present

## 2018-05-22 DIAGNOSIS — G894 Chronic pain syndrome: Secondary | ICD-10-CM | POA: Diagnosis not present

## 2018-05-30 DIAGNOSIS — M79645 Pain in left finger(s): Secondary | ICD-10-CM | POA: Diagnosis not present

## 2018-05-30 DIAGNOSIS — M65831 Other synovitis and tenosynovitis, right forearm: Secondary | ICD-10-CM | POA: Diagnosis not present

## 2018-05-30 DIAGNOSIS — R2230 Localized swelling, mass and lump, unspecified upper limb: Secondary | ICD-10-CM | POA: Diagnosis not present

## 2018-05-30 DIAGNOSIS — M1812 Unilateral primary osteoarthritis of first carpometacarpal joint, left hand: Secondary | ICD-10-CM | POA: Diagnosis not present

## 2018-06-12 DIAGNOSIS — R2232 Localized swelling, mass and lump, left upper limb: Secondary | ICD-10-CM | POA: Diagnosis not present

## 2018-07-01 ENCOUNTER — Telehealth: Payer: Self-pay | Admitting: Internal Medicine

## 2018-07-01 ENCOUNTER — Telehealth: Payer: Medicare Other | Admitting: Physician Assistant

## 2018-07-01 DIAGNOSIS — H01002 Unspecified blepharitis right lower eyelid: Secondary | ICD-10-CM

## 2018-07-01 MED ORDER — BACITRACIN-POLYMYXIN B 500-10000 UNIT/GM OP OINT: 1.0000 "application " | TOPICAL_OINTMENT | Freq: Four times a day (QID) | OPHTHALMIC | 0 refills | Status: DC

## 2018-07-01 NOTE — Progress Notes (Signed)
We are sorry that you are not feeling well. Here is how we plan to help!  Based on what you have shared with me it looks like you have a stye.  A stye is an inflammation of the eyelid.  It is often a red, painful lump near the edge of the eyelid that may look like a boil or a pimple.  A stye develops when an infection occurs at the base of an eyelash.   We have made appropriate suggestions for you based upon your presentation: Simple styes can be treated without medical intervention.  Most styes either resolve spontaneously or resolve with simple home treatment by applying warm compresses or heated washcloth to the stye for about 10-15 minutes three to four times a day. This causes the stye to drain and resolve. I have also sent in a topical antibiotic to apply.   HOME CARE:   Wash your hands often!  Let the stye open on its own. Don't squeeze or open it.  Don't rub your eyes. This can irritate your eyes and let in bacteria.  If you need to touch your eyes, wash your hands first.  Don't wear eye makeup or contact lenses until the area has healed.  GET HELP RIGHT AWAY IF:   Your symptoms do not improve.  You develop blurred or loss of vision.  Your symptoms worsen (increased discharge, pain or redness).  Thank you for choosing an e-visit.  Your e-visit answers were reviewed by a board certified advanced clinical practitioner to complete your personal care plan.  Depending upon the condition, your plan could have included both over the counter or prescription medications.  Please review your pharmacy choice.  Make sure the pharmacy is open so you can pick up prescription now.  If there is a problem, you may contact your provider through [employer] and have the prescription routed to another pharmacy.    Your safety is important to [place].  If you have drug allergies check your prescription carefully.  For the next 24 hours you can use MyChart to ask questions about today's visit,  request a non-urgent call back, or ask for a work or school excuse.  You will get an email in the next two days asking about your experience.  I hope you that your e-visit has been valuable and will speed your recovery.  Approximately 5 minutes spent documenting and reviewing patient chart.  Konrad Felix, PA-C

## 2018-07-01 NOTE — Telephone Encounter (Signed)
Copied from [place] [id number]. Topic: Quick Communication - See Telephone Encounter >> Jul 01, 2018  1:37 PM Robina Ade, Helene Kelp D wrote: CRM for notification. See Telephone encounter for: 07/01/18. Patient called and said that she is having trouble setting her device to current update which its not letting her. I tried to walk her through setting her device but it gives an error of that her google chrome needs update even though it already is. She would like to talk to Dr. Jerilee Hoh CMA about this and possibly sending link to her e-mail on file which is correct. Please call patient back, thanks.

## 2018-07-02 ENCOUNTER — Other Ambulatory Visit: Payer: Self-pay

## 2018-07-02 ENCOUNTER — Ambulatory Visit (INDEPENDENT_AMBULATORY_CARE_PROVIDER_SITE_OTHER): Payer: Medicare Other | Admitting: Internal Medicine

## 2018-07-02 DIAGNOSIS — H00011 Hordeolum externum right upper eyelid: Secondary | ICD-10-CM | POA: Diagnosis not present

## 2018-07-02 NOTE — Progress Notes (Signed)
Virtual Visit via Telephone Note  I connected with Brandi Spence on 07/02/18 at  8:00 AM EDT by telephone and verified that I am speaking with the correct person using two identifiers.   I discussed the limitations, risks, security and privacy concerns of performing an evaluation and management service by telephone and the availability of in person appointments. I also discussed with the patient that there may be a patient responsible charge related to this service. The patient expressed understanding and agreed to proceed.  We initially attempted to connect via video chat but were unable to due to technical difficulties on the patient's end, so we converted this visit to a phone visit.   Location patient: home Location provider: work office Participants present for the call: patient, provider Patient did not have a visit in the prior 7 days to address this/these issue(s).   History of Present Illness:  Stye right upper euelid since Sunday _4 days ago. Starting draining spontaneously last night, some pain but not severe, no blurry vision, no fevers. She will email [place] pictures.    Observations/Objective: Patient sounds cheerful and well on the phone. I do not appreciate any increased work of breathing. Speech and thought processing are grossly intact. Patient reported vitals: None reported   Current Outpatient Medications:  .  aspirin 81 MG EC tablet, Take 81 mg by mouth daily.  , Disp: , Rfl:  .  bacitracin-polymyxin b (POLYSPORIN) ophthalmic ointment, Place 1 application into the right eye 4 (four) times daily. apply to eye every 12 hours while awake, Disp: 3.5 g, Rfl: 0 .  cetirizine (ZYRTEC) 10 MG tablet, Take 10 mg by mouth daily., Disp: , Rfl:  .  Cholecalciferol (VITAMIN D3 PO), Take 2,000 Units by mouth daily., Disp: , Rfl:  .  diphenhydrAMINE HCl, Sleep, (SLEEP-AID) 50 MG CAPS, Take by mouth., Disp: , Rfl:  .  docusate sodium (COLACE) 100 MG capsule, Take 200 mg by  mouth daily., Disp: , Rfl:  .  FIBER PO, Take 500 mg by mouth daily., Disp: , Rfl:  .  HYDROcodone-acetaminophen (NORCO/VICODIN) 5-325 MG tablet, Take 1 tablet by mouth every 6 (six) hours as needed for moderate pain. Pain management - Dr Hardin Negus, Disp: , Rfl:  .  levothyroxine (SYNTHROID, LEVOTHROID) 75 MCG tablet, Take 75 mcg by mouth daily before breakfast., Disp: , Rfl:  .  lisinopril-hydrochlorothiazide (PRINZIDE,ZESTORETIC) 20-25 MG tablet, TAKE 1/2 TABLET EVERY DAY, Disp: 90 tablet, Rfl: 1 .  potassium chloride (MICRO-K) 10 MEQ CR capsule, potassium chloride ER 10 mEq capsule,extended release, Disp: , Rfl:  .  pregabalin (LYRICA) 150 MG capsule, Take 150 mg by mouth 2 (two) times daily. Pain management, Disp: , Rfl:  .  simvastatin (ZOCOR) 20 MG tablet, Take 1 tablet (20 mg total) by mouth at bedtime., Disp: 90 tablet, Rfl: 1 .  triamterene-hydrochlorothiazide (DYAZIDE) 37.5-25 MG capsule, triamterene 37.5 mg-hydrochlorothiazide 25 mg capsule, Disp: , Rfl:   Review of Systems:  Constitutional: Denies fever, chills, diaphoresis, appetite change and fatigue.  HEENT: Denies photophobia, eye pain, redness, hearing loss, ear pain, congestion, sore throat, rhinorrhea, sneezing, mouth sores, trouble swallowing, neck pain, neck stiffness and tinnitus.   Respiratory: Denies SOB, DOE, cough, chest tightness,  and wheezing.   Cardiovascular: Denies chest pain, palpitations and leg swelling.  Gastrointestinal: Denies nausea, vomiting, abdominal pain, diarrhea, constipation, blood in stool and abdominal distention.  Genitourinary: Denies dysuria, urgency, frequency, hematuria, flank pain and difficulty urinating.  Endocrine: Denies: hot or cold  intolerance, sweats, changes in hair or nails, polyuria, polydipsia. Musculoskeletal: Denies myalgias, back pain, joint swelling, arthralgias and gait problem.  Skin: Denies pallor, rash and wound.  Neurological: Denies dizziness, seizures, syncope, weakness,  light-headedness, numbness and headaches.  Hematological: Denies adenopathy. Easy bruising, personal or family bleeding history  Psychiatric/Behavioral: Denies suicidal ideation, mood changes, confusion, nervousness, sleep disturbance and agitation   Assessment and Plan:  Hordeolum externum of right upper eyelid  Continue warm compresses, it is already draining spontaneously. She will contact us if any changes to consider a topical abx.  I discussed the assessment and treatment plan with the patient. The patient was provided an opportunity to ask questions and all were answered. The patient agreed with the plan and demonstrated an understanding of the instructions.   The patient was advised to call back or seek an in-person evaluation if the symptoms worsen or if the condition fails to improve as anticipated.  I provided 11 minutes of non-face-to-face time during this encounter.   Lelon Frohlich, MD [facility]

## 2018-07-02 NOTE — Telephone Encounter (Signed)
Dr Jerilee Hoh did a phone visit with patient 07/02/2018.

## 2018-07-30 DIAGNOSIS — R7301 Impaired fasting glucose: Secondary | ICD-10-CM | POA: Diagnosis not present

## 2018-07-30 DIAGNOSIS — E559 Vitamin D deficiency, unspecified: Secondary | ICD-10-CM | POA: Diagnosis not present

## 2018-07-30 DIAGNOSIS — I10 Essential (primary) hypertension: Secondary | ICD-10-CM | POA: Diagnosis not present

## 2018-07-30 DIAGNOSIS — E039 Hypothyroidism, unspecified: Secondary | ICD-10-CM | POA: Diagnosis not present

## 2018-08-05 DIAGNOSIS — R7301 Impaired fasting glucose: Secondary | ICD-10-CM | POA: Diagnosis not present

## 2018-08-05 DIAGNOSIS — M8580 Other specified disorders of bone density and structure, unspecified site: Secondary | ICD-10-CM | POA: Diagnosis not present

## 2018-08-05 DIAGNOSIS — G609 Hereditary and idiopathic neuropathy, unspecified: Secondary | ICD-10-CM | POA: Diagnosis not present

## 2018-08-05 DIAGNOSIS — E559 Vitamin D deficiency, unspecified: Secondary | ICD-10-CM | POA: Diagnosis not present

## 2018-08-05 DIAGNOSIS — I10 Essential (primary) hypertension: Secondary | ICD-10-CM | POA: Diagnosis not present

## 2018-08-05 DIAGNOSIS — E039 Hypothyroidism, unspecified: Secondary | ICD-10-CM | POA: Diagnosis not present

## 2018-08-05 DIAGNOSIS — E041 Nontoxic single thyroid nodule: Secondary | ICD-10-CM | POA: Diagnosis not present

## 2018-09-22 ENCOUNTER — Encounter: Payer: Self-pay | Admitting: Internal Medicine

## 2018-09-26 ENCOUNTER — Ambulatory Visit: Payer: Federal, State, Local not specified - PPO | Admitting: Internal Medicine

## 2018-10-04 IMAGING — US US THYROID
1 series · 13 of 25 positions shown · non-contrast
Comparison: 08/09/2015, CT 07/30/2014, 06/02/2013, most remote
07/28/2004, including a biopsy performed of the left thyroid
08/17/2004

CLINICAL DATA: 71-year-old female with a history of thyroid nodules

EXAM:
THYROID ULTRASOUND
TECHNIQUE: Ultrasound examination of the thyroid gland and adjacent soft
tissues was performed.

[Series 1: us thyroid · 0.06mm/px · 13 of 49 slices shown]
[im 1/49]
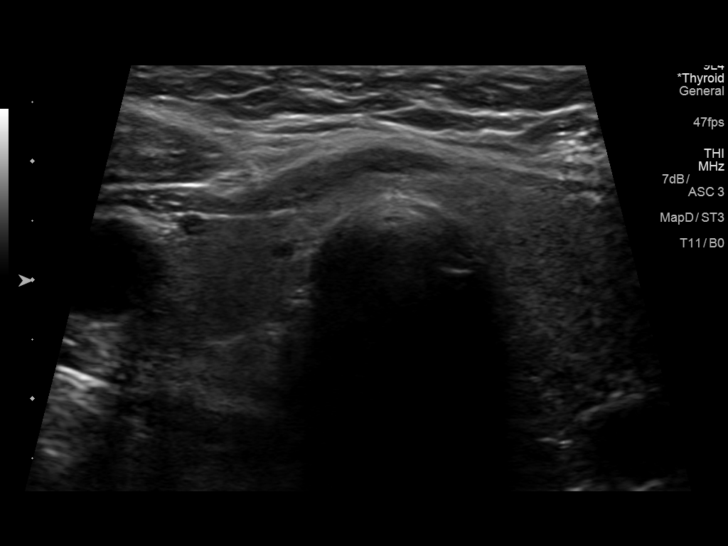
[im 5/49]
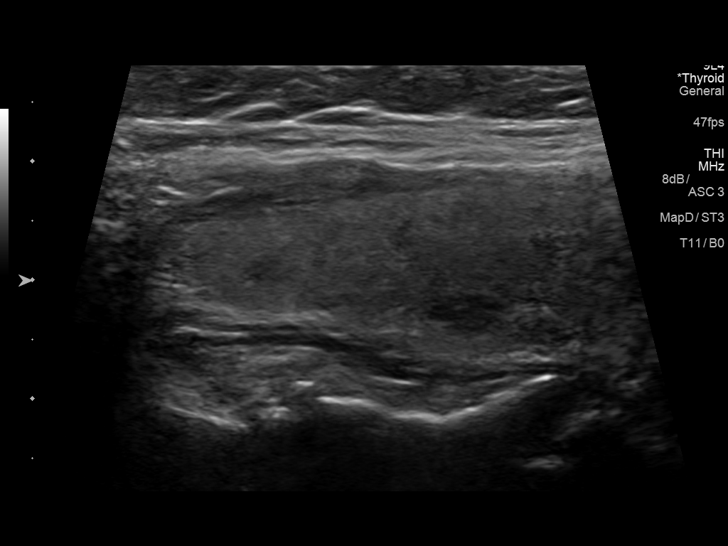
[im 9/49]
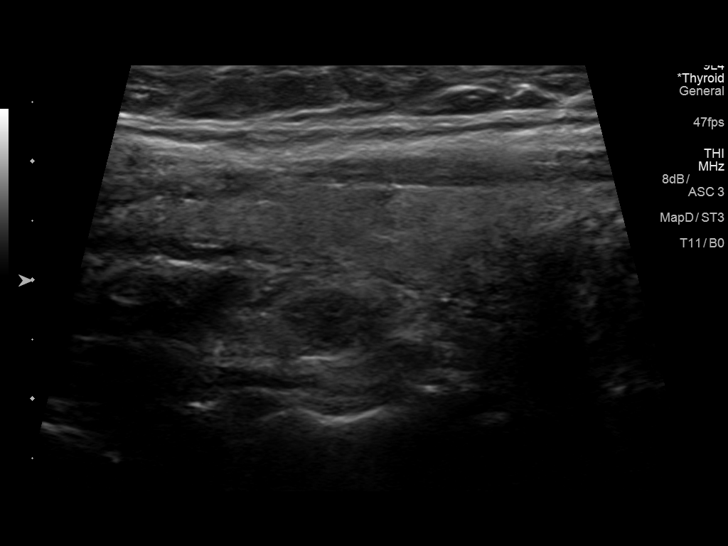
[im 13/49]
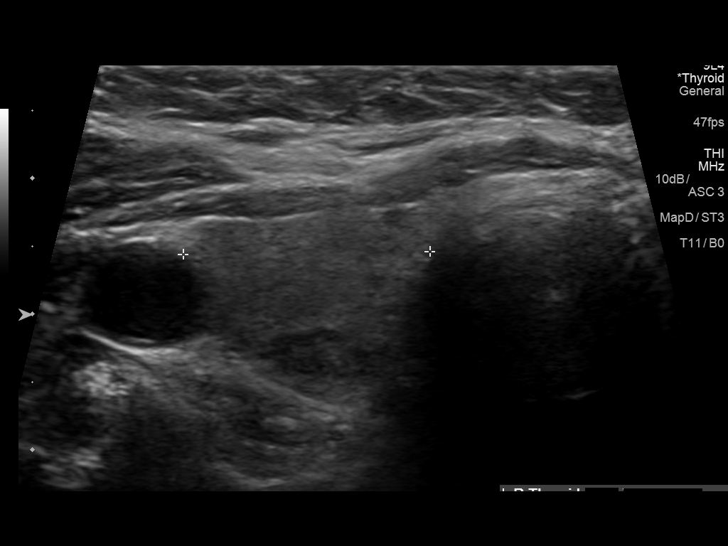
[im 17/49]
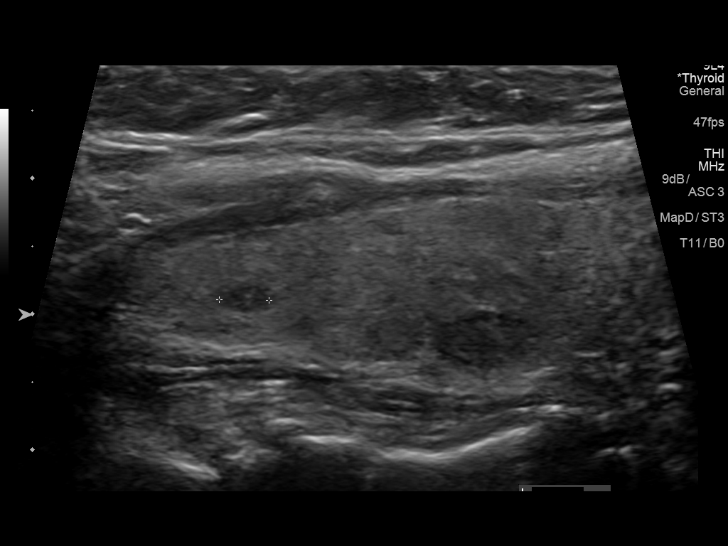
[im 21/49]
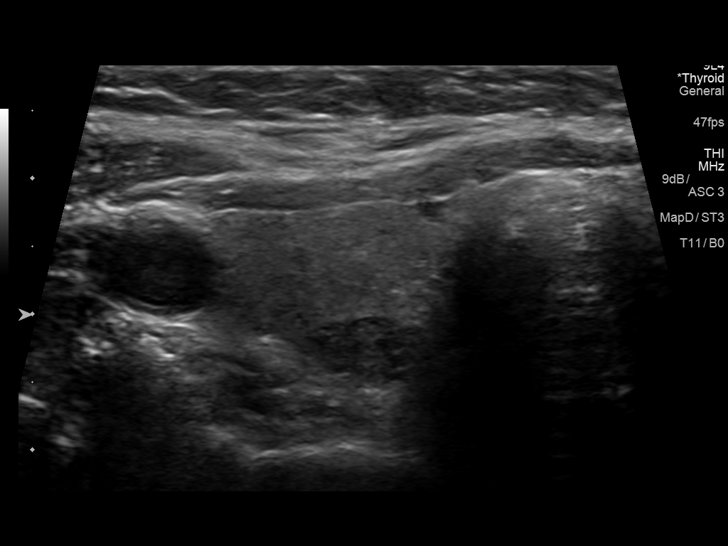
[im 25/49]
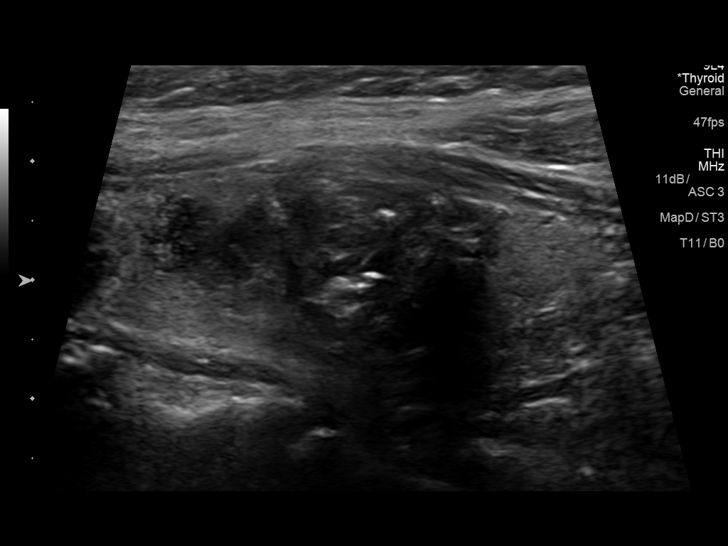
[im 29/49]
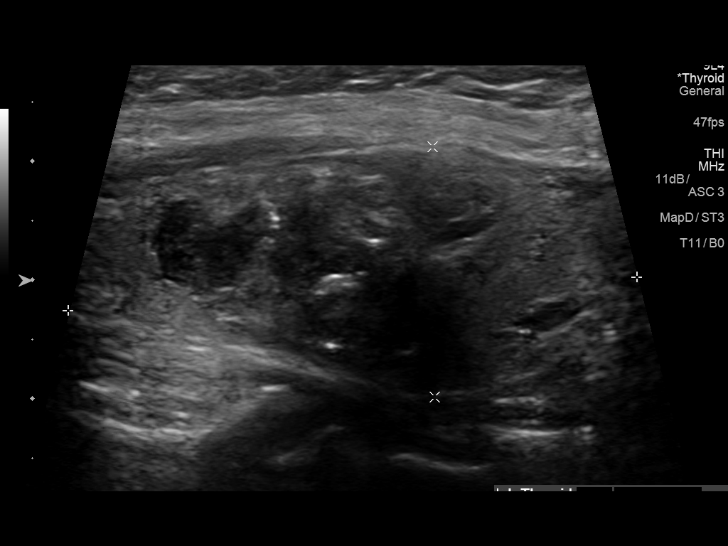
[im 33/49]
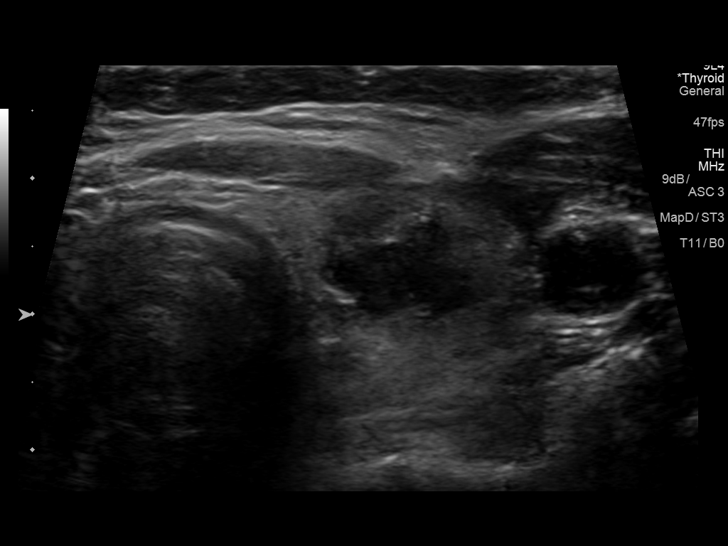
[im 37/49]
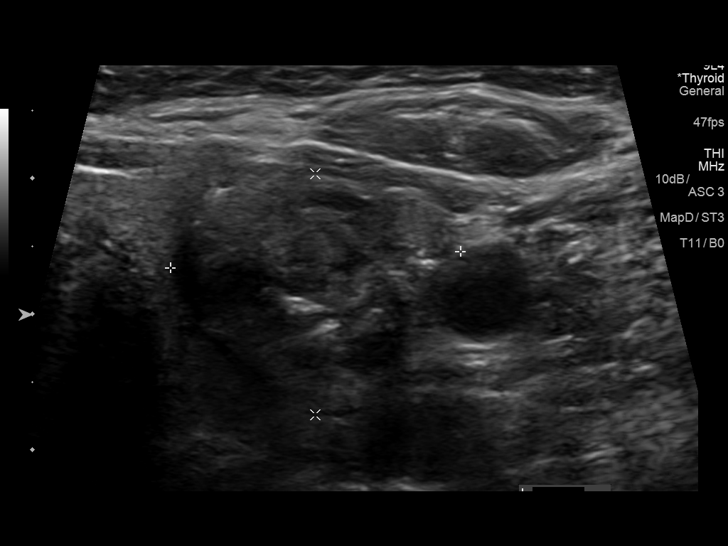
[im 41/49]
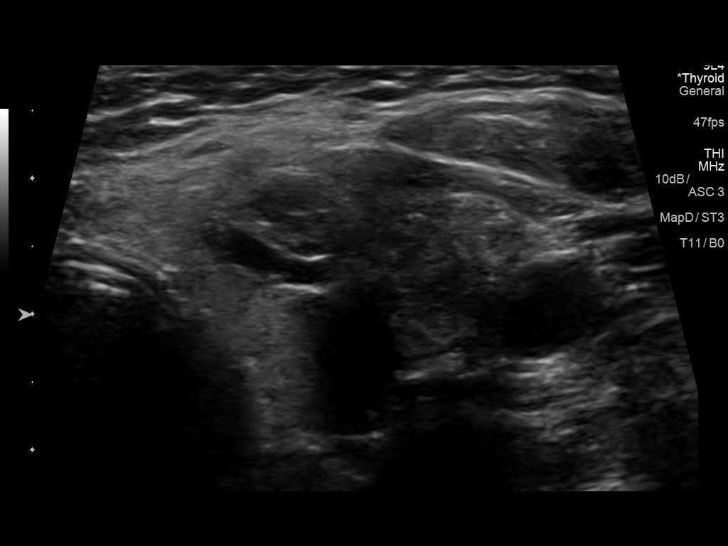
[im 45/49]
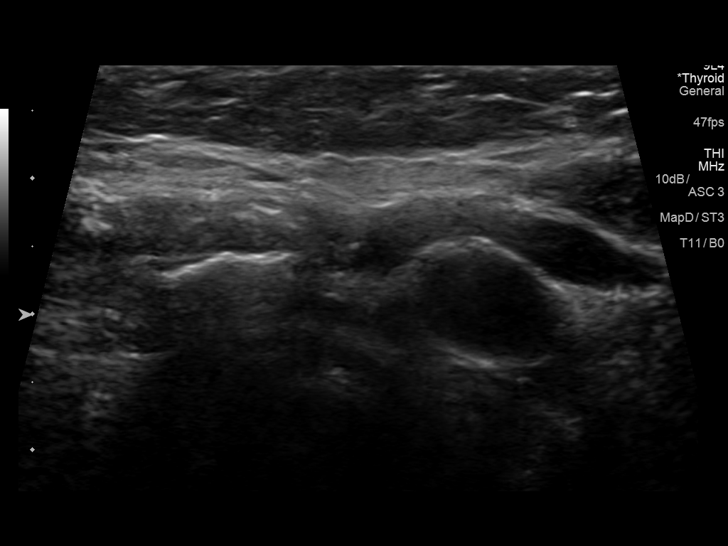
[im 49/49]
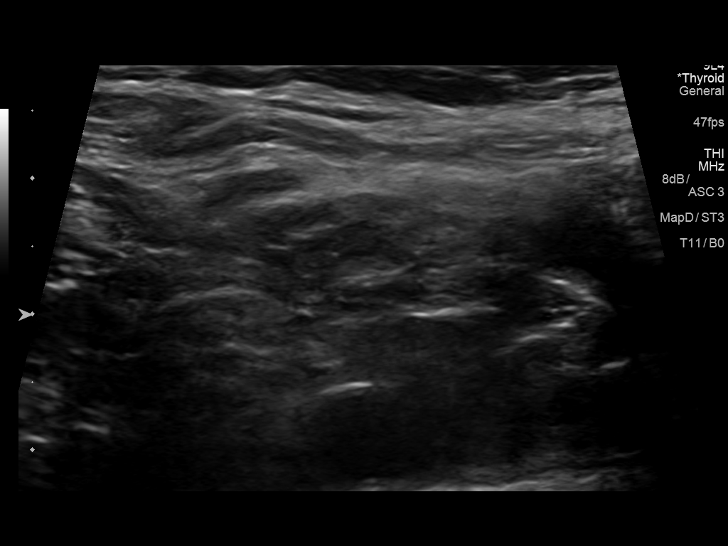

[13 of 25 positions shown; findings below may reference images not displayed]

FINDINGS: Parenchymal Echotexture: Mildly heterogenous

Isthmus: 0.2 cm

Right lobe: 5.4 cm x 1.7 cm x 1.8 cm

Left lobe: 5.0 cm x 2.2 cm x 2.9 cm

_________________________________________________________

Estimated total number of nodules >/= 1 cm: 1

Number of spongiform nodules >/=  2 cm not described below (TR1): 0

Number of mixed cystic and solid nodules >/= 1.5 cm not described
below (TR2): 0

_________________________________________________________

Nodule # 1:

Location: Right; Superior

Maximum size: 0.9 cm; Other 2 dimensions: 0.9 cm x 0.5 cm

Composition: spongiform (0)

ACR TI-RADS recommendations:

Spongiform nodule does not meet criteria for surveillance or biopsy

_________________________________________________________

Nodule # 2:

Location: Left; Mid

Maximum size: 2.9 cm; Other 2 dimensions: 2.2 cm x 1.8 cm. Nodule
has further decreased in size from prior measurement of 3.1 cm

This solid nodule with coarse internal calcifications has been
previously biopsied 08/17/2004.

No new nodules identified.
IMPRESSION: The left thyroid nodule has further decreased in size from the prior
ultrasound survey, and has been previously biopsied in 5771.
Correlation with prior results may be useful. If previously benign,
current ACR guidelines would be to discontinue routine follow-up.

## 2018-10-23 DIAGNOSIS — Z1231 Encounter for screening mammogram for malignant neoplasm of breast: Secondary | ICD-10-CM | POA: Diagnosis not present

## 2018-10-23 LAB — HM MAMMOGRAPHY

## 2018-10-29 ENCOUNTER — Encounter: Payer: Self-pay | Admitting: Internal Medicine

## 2018-12-20 ENCOUNTER — Other Ambulatory Visit: Payer: Self-pay | Admitting: *Deleted

## 2018-12-20 DIAGNOSIS — E7800 Pure hypercholesterolemia, unspecified: Secondary | ICD-10-CM

## 2018-12-20 MED ORDER — SIMVASTATIN 20 MG PO TABS: 20.0000 mg | ORAL_TABLET | Freq: Every day | ORAL | 1 refills | Status: DC

## 2019-01-13 DIAGNOSIS — M79645 Pain in left finger(s): Secondary | ICD-10-CM | POA: Diagnosis not present

## 2019-01-13 DIAGNOSIS — M1812 Unilateral primary osteoarthritis of first carpometacarpal joint, left hand: Secondary | ICD-10-CM | POA: Diagnosis not present

## 2019-04-16 DIAGNOSIS — M1812 Unilateral primary osteoarthritis of first carpometacarpal joint, left hand: Secondary | ICD-10-CM | POA: Diagnosis not present

## 2019-04-16 DIAGNOSIS — M79645 Pain in left finger(s): Secondary | ICD-10-CM | POA: Diagnosis not present

## 2019-04-18 DIAGNOSIS — Z23 Encounter for immunization: Secondary | ICD-10-CM | POA: Diagnosis not present

## 2019-04-28 DIAGNOSIS — M25512 Pain in left shoulder: Secondary | ICD-10-CM | POA: Diagnosis not present

## 2019-05-01 DIAGNOSIS — H903 Sensorineural hearing loss, bilateral: Secondary | ICD-10-CM | POA: Diagnosis not present

## 2019-05-01 DIAGNOSIS — H938X3 Other specified disorders of ear, bilateral: Secondary | ICD-10-CM | POA: Diagnosis not present

## 2019-05-01 DIAGNOSIS — H9311 Tinnitus, right ear: Secondary | ICD-10-CM | POA: Diagnosis not present

## 2019-05-01 DIAGNOSIS — H8102 Meniere's disease, left ear: Secondary | ICD-10-CM | POA: Diagnosis not present

## 2019-05-16 DIAGNOSIS — Z23 Encounter for immunization: Secondary | ICD-10-CM | POA: Diagnosis not present

## 2019-06-06 ENCOUNTER — Encounter: Payer: Medicare Other | Admitting: Internal Medicine

## 2019-06-15 ENCOUNTER — Other Ambulatory Visit: Payer: Self-pay | Admitting: Internal Medicine

## 2019-06-15 DIAGNOSIS — E7800 Pure hypercholesterolemia, unspecified: Secondary | ICD-10-CM

## 2019-06-25 ENCOUNTER — Other Ambulatory Visit: Payer: Self-pay

## 2019-06-26 ENCOUNTER — Encounter: Payer: Self-pay | Admitting: Internal Medicine

## 2019-06-26 ENCOUNTER — Ambulatory Visit (INDEPENDENT_AMBULATORY_CARE_PROVIDER_SITE_OTHER): Payer: Medicare Other | Admitting: Internal Medicine

## 2019-06-26 ENCOUNTER — Telehealth: Payer: Self-pay | Admitting: Internal Medicine

## 2019-06-26 VITALS — BP 110/70 | HR 64 | Temp 97.7°F | Ht 67.0 in | Wt 199.0 lb

## 2019-06-26 DIAGNOSIS — E7800 Pure hypercholesterolemia, unspecified: Secondary | ICD-10-CM

## 2019-06-26 DIAGNOSIS — I10 Essential (primary) hypertension: Secondary | ICD-10-CM

## 2019-06-26 DIAGNOSIS — K219 Gastro-esophageal reflux disease without esophagitis: Secondary | ICD-10-CM | POA: Diagnosis not present

## 2019-06-26 DIAGNOSIS — E039 Hypothyroidism, unspecified: Secondary | ICD-10-CM | POA: Diagnosis not present

## 2019-06-26 DIAGNOSIS — R7302 Impaired glucose tolerance (oral): Secondary | ICD-10-CM | POA: Diagnosis not present

## 2019-06-26 DIAGNOSIS — E669 Obesity, unspecified: Secondary | ICD-10-CM | POA: Diagnosis not present

## 2019-06-26 DIAGNOSIS — G894 Chronic pain syndrome: Secondary | ICD-10-CM

## 2019-06-26 DIAGNOSIS — Z0000 Encounter for general adult medical examination without abnormal findings: Secondary | ICD-10-CM

## 2019-06-26 DIAGNOSIS — E66811 Obesity, class 1: Secondary | ICD-10-CM

## 2019-06-26 DIAGNOSIS — E559 Vitamin D deficiency, unspecified: Secondary | ICD-10-CM | POA: Diagnosis not present

## 2019-06-26 LAB — COMPREHENSIVE METABOLIC PANEL
ALT: 14 U/L (ref 0–35)
AST: 15 U/L (ref 0–37)
Albumin: 4.4 g/dL (ref 3.5–5.2)
Alkaline Phosphatase: 59 U/L (ref 39–117)
BUN: 17 mg/dL (ref 6–23)
CO2: 32 mEq/L (ref 19–32)
Calcium: 9.7 mg/dL (ref 8.4–10.5)
Chloride: 100 mEq/L (ref 96–112)
Creatinine, Ser: 0.92 mg/dL (ref 0.40–1.20)
GFR: 59.61 mL/min — ABNORMAL LOW (ref >60.00)
Glucose, Bld: 103 mg/dL — ABNORMAL HIGH (ref 70–99)
Potassium: 4.7 mEq/L (ref 3.5–5.1)
Sodium: 139 mEq/L (ref 135–145)
Total Bilirubin: 0.9 mg/dL (ref 0.2–1.2)
Total Protein: 6.4 g/dL (ref 6.0–8.3)

## 2019-06-26 LAB — CBC WITH DIFFERENTIAL/PLATELET
Basophils Absolute: 0 10*3/uL (ref 0.0–0.1)
Basophils Relative: 0.9 % (ref 0.0–3.0)
Eosinophils Absolute: 0.1 10*3/uL (ref 0.0–0.7)
Eosinophils Relative: 3.1 % (ref 0.0–5.0)
HCT: 41.8 % (ref 36.0–46.0)
Hemoglobin: 14.3 g/dL (ref 12.0–15.0)
Lymphocytes Relative: 42.7 % (ref 12.0–46.0)
Lymphs Abs: 1.8 10*3/uL (ref 0.7–4.0)
MCHC: 34.3 g/dL (ref 30.0–36.0)
MCV: 90.7 fl (ref 78.0–100.0)
Monocytes Absolute: 0.4 10*3/uL (ref 0.1–1.0)
Monocytes Relative: 9.8 % (ref 3.0–12.0)
Neutro Abs: 1.8 10*3/uL (ref 1.4–7.7)
Neutrophils Relative %: 43.5 % (ref 43.0–77.0)
Platelets: 237 10*3/uL (ref 150.0–400.0)
RBC: 4.61 Mil/uL (ref 3.87–5.11)
RDW: 13.2 % (ref 11.5–15.5)
WBC: 4.1 10*3/uL (ref 4.0–10.5)

## 2019-06-26 LAB — VITAMIN D 25 HYDROXY (VIT D DEFICIENCY, FRACTURES): VITD: 35.23 ng/mL (ref 30.00–100.00)

## 2019-06-26 LAB — LIPID PANEL
Cholesterol: 199 mg/dL (ref 0–200)
HDL: 50.6 mg/dL (ref >39.00)
NonHDL: 148.77
Total CHOL/HDL Ratio: 4
Triglycerides: 223 mg/dL — ABNORMAL HIGH (ref 0.0–149.0)
VLDL: 44.6 mg/dL — ABNORMAL HIGH (ref 0.0–40.0)

## 2019-06-26 LAB — HEMOGLOBIN A1C: Hgb A1c MFr Bld: 5.9 % (ref 4.6–6.5)

## 2019-06-26 LAB — LDL CHOLESTEROL, DIRECT: Direct LDL: 112 mg/dL

## 2019-06-26 LAB — TSH: TSH: 1.01 u[IU]/mL (ref 0.35–4.50)

## 2019-06-26 MED ORDER — SIMVASTATIN 20 MG PO TABS: ORAL_TABLET | ORAL | 1 refills | Status: DC

## 2019-06-26 MED ORDER — LISINOPRIL-HYDROCHLOROTHIAZIDE 20-25 MG PO TABS: ORAL_TABLET | ORAL | 1 refills | Status: DC

## 2019-06-26 NOTE — Patient Instructions (Signed)
-Nice seeing you today!!  -Lab work today; will notify you once results are available.  -See you back in 6 months or sooner as needed.   Preventive Care 33 Years and Older, Female Preventive care refers to lifestyle choices and visits with your health care provider that can promote health and wellness. This includes:  A yearly physical exam. This is also called an annual well check.  Regular dental and eye exams.  Immunizations.  Screening for certain conditions.  Healthy lifestyle choices, such as diet and exercise. What can I expect for my preventive care visit? Physical exam Your health care provider will check:  Height and weight. These may be used to calculate body mass index (BMI), which is a measurement that tells if you are at a healthy weight.  Heart rate and blood pressure.  Your skin for abnormal spots. Counseling Your health care provider may ask you questions about:  Alcohol, tobacco, and drug use.  Emotional well-being.  Home and relationship well-being.  Sexual activity.  Eating habits.  History of falls.  Memory and ability to understand (cognition).  Work and work Statistician.  Pregnancy and menstrual history. What immunizations do I need?  Influenza (flu) vaccine  This is recommended every year. Tetanus, diphtheria, and pertussis (Tdap) vaccine  You may need a Td booster every 10 years. Varicella (chickenpox) vaccine  You may need this vaccine if you have not already been vaccinated. Zoster (shingles) vaccine  You may need this after age 63. Pneumococcal conjugate (PCV13) vaccine  One dose is recommended after age 62. Pneumococcal polysaccharide (PPSV23) vaccine  One dose is recommended after age 83. Measles, mumps, and rubella (MMR) vaccine  You may need at least one dose of MMR if you were born in 1957 or later. You may also need a second dose. Meningococcal conjugate (MenACWY) vaccine  You may need this if you have certain  conditions. Hepatitis A vaccine  You may need this if you have certain conditions or if you travel or work in places where you may be exposed to hepatitis A. Hepatitis B vaccine  You may need this if you have certain conditions or if you travel or work in places where you may be exposed to hepatitis B. Haemophilus influenzae type b (Hib) vaccine  You may need this if you have certain conditions. You may receive vaccines as individual doses or as more than one vaccine together in one shot (combination vaccines). Talk with your health care provider about the risks and benefits of combination vaccines. What tests do I need? Blood tests  Lipid and cholesterol levels. These may be checked every 5 years, or more frequently depending on your overall health.  Hepatitis C test.  Hepatitis B test. Screening  Lung cancer screening. You may have this screening every year starting at age 74 if you have a 30-pack-year history of smoking and currently smoke or have quit within the past 15 years.  Colorectal cancer screening. All adults should have this screening starting at age 69 and continuing until age 14. Your health care provider may recommend screening at age 15 if you are at increased risk. You will have tests every 1-10 years, depending on your results and the type of screening test.  Diabetes screening. This is done by checking your blood sugar (glucose) after you have not eaten for a while (fasting). You may have this done every 1-3 years.  Mammogram. This may be done every 1-2 years. Talk with your health care provider about  how often you should have regular mammograms.  BRCA-related cancer screening. This may be done if you have a family history of breast, ovarian, tubal, or peritoneal cancers. Other tests  Sexually transmitted disease (STD) testing.  Bone density scan. This is done to screen for osteoporosis. You may have this done starting at age 2. Follow these instructions at  home: Eating and drinking  Eat a diet that includes fresh fruits and vegetables, whole grains, lean protein, and low-fat dairy products. Limit your intake of foods with high amounts of sugar, saturated fats, and salt.  Take vitamin and mineral supplements as recommended by your health care provider.  Do not drink alcohol if your health care provider tells you not to drink.  If you drink alcohol: ? Limit how much you have to 0-1 drink a day. ? Be aware of how much alcohol is in your drink. In the [place] one drink equals one 12 oz bottle of beer (355 mL), one 5 oz glass of wine (148 mL), or one 1 oz glass of hard liquor (44 mL). Lifestyle  Take daily care of your teeth and gums.  Stay active. Exercise for at least 30 minutes on 5 or more days each week.  Do not use any products that contain nicotine or tobacco, such as cigarettes, e-cigarettes, and chewing tobacco. If you need help quitting, ask your health care provider.  If you are sexually active, practice safe sex. Use a condom or other form of protection in order to prevent STIs (sexually transmitted infections).  Talk with your health care provider about taking a low-dose aspirin or statin. What's next?  Go to your health care provider once a year for a well check visit.  Ask your health care provider how often you should have your eyes and teeth checked.  Stay up to date on all vaccines. This information is not intended to replace advice given to you by your health care provider. Make sure you discuss any questions you have with your health care provider. Document Revised: 02/14/2018 Document Reviewed: 02/14/2018 Elsevier Patient Education  2020 [employer].

## 2019-06-26 NOTE — Progress Notes (Signed)
Established Patient Office Visit     This visit occurred during the SARS-CoV-2 public health emergency.  Safety protocols were in place, including screening questions prior to the visit, additional usage of staff PPE, and extensive cleaning of exam room while observing appropriate contact time as indicated for disinfecting solutions.    CC/Reason for Visit: Annual preventive exam and subsequent Medicare wellness visit  HPI: Brandi Spence is a 74 y.o. female who is coming in today for the above mentioned reasons. Past Medical History is significant for: Hypertension, hyperlipidemia, hypothyroidism, peripheral neuropathy, chronic pain syndrome on Lyrica and hydrocodone as prescribed by Dr. Hardin Negus with pain management.  She was also diagnosed with impaired glucose tolerance with an A1c of 6 back in January 2020 as well as vitamin D deficiency.  She has no acute complaints today.  She received both Covid vaccines, she had a colonoscopy in 2019 and a mammogram in the fall 2020.   Past Medical/Surgical History: Past Medical History:  Diagnosis Date  . Hyperlipidemia   . Hypertension   . Hypothyroidism   . Kidney stones   . Neuropathy    feet    Past Surgical History:  Procedure Laterality Date  . ABDOMINAL HYSTERECTOMY  2005  . BREAST ENHANCEMENT SURGERY Bilateral 2000  . BUNIONECTOMY Right 2013  . CARPAL TUNNEL RELEASE Right 1997  . COLONOSCOPY  01/01/2013  . 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 KNEE ARTHROSCOPY Right 2006   2012  . PAROTID GLAND TUMOR EXCISION  2009  . POLYPECTOMY    . SPINAL CORD STIMULATOR IMPLANT Left 2012  . TARSAL TUNNEL RELEASE Right 2005    Social History:  reports that she quit smoking about 39 years ago. She has never used smokeless tobacco. She reports current alcohol use of about 1.0 standard drinks of alcohol per week. She reports that she does not use drugs.  Allergies: Allergies  Allergen Reactions  .  Prednisone Other (See Comments)    Indigestion, dizziness    Family History:  Family History  Problem Relation Age of Onset  . Colon cancer Father 48  . Colon polyps Father   . Esophageal cancer Neg Hx   . Rectal cancer Neg Hx   . Stomach cancer Neg Hx      Current Outpatient Medications:  .  aspirin 81 MG EC tablet, Take 81 mg by mouth daily.  , Disp: , Rfl:  .  bacitracin-polymyxin b (POLYSPORIN) ophthalmic ointment, Place 1 application into the right eye 4 (four) times daily. apply to eye every 12 hours while awake, Disp: 3.5 g, Rfl: 0 .  cetirizine (ZYRTEC) 10 MG tablet, Take 10 mg by mouth daily., Disp: , Rfl:  .  Cholecalciferol (VITAMIN D3 PO), Take 2,000 Units by mouth daily., Disp: , Rfl:  .  diphenhydrAMINE HCl, Sleep, (SLEEP-AID) 50 MG CAPS, Take by mouth., Disp: , Rfl:  .  docusate sodium (COLACE) 100 MG capsule, Take 200 mg by mouth daily., Disp: , Rfl:  .  FIBER PO, Take 500 mg by mouth daily., Disp: , Rfl:  .  HYDROcodone-acetaminophen (NORCO/VICODIN) 5-325 MG tablet, Take 1 tablet by mouth every 6 (six) hours as needed for moderate pain. Pain management - Dr Hardin Negus, Disp: , Rfl:  .  levothyroxine (SYNTHROID, LEVOTHROID) 75 MCG tablet, Take 75 mcg by mouth daily before breakfast., Disp: , Rfl:  .  lisinopril-hydrochlorothiazide (ZESTORETIC) 20-25 MG tablet, TAKE 1/2 TABLET EVERY DAY, Disp: 90 tablet,  Rfl: 1 .  potassium chloride (MICRO-K) 10 MEQ CR capsule, potassium chloride ER 10 mEq capsule,extended release, Disp: , Rfl:  .  pregabalin (LYRICA) 150 MG capsule, Take 150 mg by mouth 2 (two) times daily. Pain management, Disp: , Rfl:  .  simvastatin (ZOCOR) 20 MG tablet, TAKE 1 TABLET BY MOUTH EVERYDAY AT BEDTIME, Disp: 90 tablet, Rfl: 1 .  triamterene-hydrochlorothiazide (DYAZIDE) 37.5-25 MG capsule, triamterene 37.5 mg-hydrochlorothiazide 25 mg capsule, Disp: , Rfl:   Review of Systems:  Constitutional: Denies fever, chills, diaphoresis, appetite change and fatigue.   HEENT: Denies photophobia, eye pain, redness, hearing loss, ear pain, congestion, sore throat, rhinorrhea, sneezing, mouth sores, trouble swallowing, neck pain, neck stiffness and tinnitus.   Respiratory: Denies SOB, DOE, cough, chest tightness,  and wheezing.   Cardiovascular: Denies chest pain, palpitations and leg swelling.  Gastrointestinal: Denies nausea, vomiting, abdominal pain, diarrhea, constipation, blood in stool and abdominal distention.  Genitourinary: Denies dysuria, urgency, frequency, hematuria, flank pain and difficulty urinating.  Endocrine: Denies: hot or cold intolerance, sweats, changes in hair or nails, polyuria, polydipsia. Musculoskeletal: Denies myalgias, back pain, joint swelling, arthralgias and gait problem.  Skin: Denies pallor, rash and wound.  Neurological: Denies dizziness, seizures, syncope, weakness, light-headedness, numbness and headaches.  Hematological: Denies adenopathy. Easy bruising, personal or family bleeding history  Psychiatric/Behavioral: Denies suicidal ideation, mood changes, confusion, nervousness, sleep disturbance and agitation    Physical Exam: Vitals:   06/26/19 1020  BP: 110/70  Pulse: 64  Temp: 97.7 F (36.5 C)  TempSrc: Temporal  SpO2: 96%  Weight: 199 lb (90.3 kg)  Height: 5' 7"  (1.702 m)    Body mass index is 31.17 kg/m.   Constitutional: NAD, calm, comfortable Eyes: PERRL, lids and conjunctivae normal ENMT: Mucous membranes are moist. Tympanic membrane is pearly white, no erythema or bulging. Neck: normal, supple, no masses, no thyromegaly Respiratory: clear to auscultation bilaterally, no wheezing, no crackles. Normal respiratory effort. No accessory muscle use.  Cardiovascular: Regular rate and rhythm, no murmurs / rubs / gallops. No extremity edema.  Abdomen: no tenderness, no masses palpated. No hepatosplenomegaly. Bowel sounds positive.  Musculoskeletal: no clubbing / cyanosis. No joint deformity upper and lower  extremities. Good ROM, no contractures. Normal muscle tone.  Skin: no rashes, lesions, ulcers. No induration Neurologic: CN 2-12 grossly intact. Sensation intact, DTR normal. Strength 5/5 in all 4.  Psychiatric: Normal judgment and insight. Alert and oriented x 3. Normal mood.   Subsequent Medicare wellness visit   1. Risk factors, based on past  M,S,F -cardiovascular disease risk factors include age, history of hyperlipidemia, history of hypertension   2.  Physical activities: She has been very physically active lately with home innovations   3.  Depression/mood:  Stable, not depressed   4.  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  ADL's: Independent in all ADLs   6.  Fall risk:  Low fall risk   7.  Home safety: No problems identified   8.  Height weight, and visual acuity: Height and weight as above, visual acuity is 20/25 with each eye independently and together   9.  Counseling:  Advised healthy lifestyle   10. Lab orders based on risk factors: Laboratory update will be reviewed   11. Referral :  None today   12. Care plan:  Follow-up in 6 months   13. Cognitive assessment:  No cognitive impairment   14.  Screening: Patient provided with a written and personalized 5-10 year screening schedule in the AVS.   yes   15. Provider List Update:   PCP, ENT  16. Advance Directives: Full code     Office Visit from 06/26/2019 in [place] at [facility]  PHQ-9 Total Score  0      Fall Risk  06/26/2019 03/28/2018 02/14/2016 07/20/2014 04/10/2013  Falls in the past year? 0 0 No No No  Number falls in past yr: 0 0 - - -  Injury with Fall? 0 0 - - -     Impression and Plan:  Encounter for preventive health examination -She has routine eye and dental care. -All immunizations are up-to-date including Covid vaccines. -Screening labs today. -Healthy lifestyle has been discussed in detail. -She had a colonoscopy in  2019 and that was her last one. -She had a mammogram in August 2020 that was normal. -She last had a Pap smear 3 years ago, she will contact her gynecologist to see if she needs any further.  Essential hypertension  -Well-controlled on current regimen.  Hypercholesterolemia  -Last LDL was 114 in January 2020, she is on simvastatin. -Check lipids today.  Hypothyroidism, unspecified type  - Plan: TSH -Continue levothyroxine 75 mcg for now.  Chronic pain syndrome -Followed by pain management clinic.  Gastroesophageal reflux disease without esophagitis -Well-controlled, not on daily PPI therapy.  IGT (impaired glucose tolerance)  - Plan: Hemoglobin A1c  Vitamin D deficiency  - Plan: VITAMIN D 25 Hydroxy (Vit-D Deficiency, Fractures)  Obesity (BMI 30.0-34.9) -Discussed healthy lifestyle, including increased physical activity and better food choices to promote weight loss.    Patient Instructions  -Nice seeing you today!!  -Lab work today; will notify you once results are available.  -See you back in 6 months or sooner as needed.   Preventive Care 73 Years and Older, Female Preventive care refers to lifestyle choices and visits with your health care provider that can promote health and wellness. This includes:  A yearly physical exam. This is also called an annual well check.  Regular dental and eye exams.  Immunizations.  Screening for certain conditions.  Healthy lifestyle choices, such as diet and exercise. What can I expect for my preventive care visit? Physical exam Your health care provider will check:  Height and weight. These may be used to calculate body mass index (BMI), which is a measurement that tells if you are at a healthy weight.  Heart rate and blood pressure.  Your skin for abnormal spots. Counseling Your health care provider may ask you questions about:  Alcohol, tobacco, and drug use.  Emotional well-being.  Home and relationship  well-being.  Sexual activity.  Eating habits.  History of falls.  Memory and ability to understand (cognition).  Work and work Statistician.  Pregnancy and menstrual history. What immunizations do I need?  Influenza (flu) vaccine  This is recommended every year. Tetanus, diphtheria, and pertussis (Tdap) vaccine  You may need a Td booster every 10 years. Varicella (chickenpox) vaccine  You may need this vaccine if you have not already been vaccinated. Zoster (shingles) vaccine  You may need this after age 33. Pneumococcal conjugate (PCV13) vaccine  One dose is recommended after age 64. Pneumococcal polysaccharide (PPSV23) vaccine  One dose is recommended after age 42. Measles, mumps, and rubella (MMR) vaccine  You may need at least one dose of MMR if you were born in 1957 or later. You may also need a second dose. Meningococcal  conjugate (MenACWY) vaccine  You may need this if you have certain conditions. Hepatitis A vaccine  You may need this if you have certain conditions or if you travel or work in places where you may be exposed to hepatitis A. Hepatitis B vaccine  You may need this if you have certain conditions or if you travel or work in places where you may be exposed to hepatitis B. Haemophilus influenzae type b (Hib) vaccine  You may need this if you have certain conditions. You may receive vaccines as individual doses or as more than one vaccine together in one shot (combination vaccines). Talk with your health care provider about the risks and benefits of combination vaccines. What tests do I need? Blood tests  Lipid and cholesterol levels. These may be checked every 5 years, or more frequently depending on your overall health.  Hepatitis C test.  Hepatitis B test. Screening  Lung cancer screening. You may have this screening every year starting at age 58 if you have a 30-pack-year history of smoking and currently smoke or have quit within the past  15 years.  Colorectal cancer screening. All adults should have this screening starting at age 47 and continuing until age 38. Your health care provider may recommend screening at age 31 if you are at increased risk. You will have tests every 1-10 years, depending on your results and the type of screening test.  Diabetes screening. This is done by checking your blood sugar (glucose) after you have not eaten for a while (fasting). You may have this done every 1-3 years.  Mammogram. This may be done every 1-2 years. Talk with your health care provider about how often you should have regular mammograms.  BRCA-related cancer screening. This may be done if you have a family history of breast, ovarian, tubal, or peritoneal cancers. Other tests  Sexually transmitted disease (STD) testing.  Bone density scan. This is done to screen for osteoporosis. You may have this done starting at age 47. Follow these instructions at home: Eating and drinking  Eat a diet that includes fresh fruits and vegetables, whole grains, lean protein, and low-fat dairy products. Limit your intake of foods with high amounts of sugar, saturated fats, and salt.  Take vitamin and mineral supplements as recommended by your health care provider.  Do not drink alcohol if your health care provider tells you not to drink.  If you drink alcohol: ? Limit how much you have to 0-1 drink a day. ? Be aware of how much alcohol is in your drink. In the [place] one drink equals one 12 oz bottle of beer (355 mL), one 5 oz glass of wine (148 mL), or one 1 oz glass of hard liquor (44 mL). Lifestyle  Take daily care of your teeth and gums.  Stay active. Exercise for at least 30 minutes on 5 or more days each week.  Do not use any products that contain nicotine or tobacco, such as cigarettes, e-cigarettes, and chewing tobacco. If you need help quitting, ask your health care provider.  If you are sexually active, practice safe sex. Use a  condom or other form of protection in order to prevent STIs (sexually transmitted infections).  Talk with your health care provider about taking a low-dose aspirin or statin. What's next?  Go to your health care provider once a year for a well check visit.  Ask your health care provider how often you should have your eyes and teeth checked.  Stay  up to date on all vaccines. This information is not intended to replace advice given to you by your health care provider. Make sure you discuss any questions you have with your health care provider. Document Revised: 02/14/2018 Document Reviewed: 02/14/2018 Elsevier Patient Education  2020 [place], MD [facility]

## 2019-06-26 NOTE — Telephone Encounter (Signed)
Needs to let Dr. Jerilee Hoh know that she had a tetanus 03/31/2018 at [facility]  She also has made an appointment for a PAP smear.

## 2019-06-26 NOTE — Telephone Encounter (Signed)
noted 

## 2019-07-10 DIAGNOSIS — G4700 Insomnia, unspecified: Secondary | ICD-10-CM | POA: Diagnosis not present

## 2019-07-10 DIAGNOSIS — Z1272 Encounter for screening for malignant neoplasm of vagina: Secondary | ICD-10-CM | POA: Diagnosis not present

## 2019-07-10 DIAGNOSIS — H8109 Meniere's disease, unspecified ear: Secondary | ICD-10-CM | POA: Insufficient documentation

## 2019-07-30 DIAGNOSIS — E039 Hypothyroidism, unspecified: Secondary | ICD-10-CM | POA: Diagnosis not present

## 2019-07-30 DIAGNOSIS — E559 Vitamin D deficiency, unspecified: Secondary | ICD-10-CM | POA: Diagnosis not present

## 2019-07-30 DIAGNOSIS — R7301 Impaired fasting glucose: Secondary | ICD-10-CM | POA: Diagnosis not present

## 2019-07-31 ENCOUNTER — Other Ambulatory Visit: Payer: Self-pay

## 2019-08-01 ENCOUNTER — Encounter: Payer: Self-pay | Admitting: Internal Medicine

## 2019-08-01 ENCOUNTER — Ambulatory Visit (INDEPENDENT_AMBULATORY_CARE_PROVIDER_SITE_OTHER): Payer: Medicare Other | Admitting: Internal Medicine

## 2019-08-01 VITALS — BP 110/70 | HR 89 | Temp 97.2°F | Wt 198.9 lb

## 2019-08-01 DIAGNOSIS — I10 Essential (primary) hypertension: Secondary | ICD-10-CM | POA: Diagnosis not present

## 2019-08-01 DIAGNOSIS — I872 Venous insufficiency (chronic) (peripheral): Secondary | ICD-10-CM

## 2019-08-01 MED ORDER — LISINOPRIL 20 MG PO TABS: 10.0000 mg | ORAL_TABLET | Freq: Every day | ORAL | 1 refills | Status: DC

## 2019-08-01 NOTE — Progress Notes (Signed)
Established Patient Office Visit     This visit occurred during the SARS-CoV-2 public health emergency.  Safety protocols were in place, including screening questions prior to the visit, additional usage of staff PPE, and extensive cleaning of exam room while observing appropriate contact time as indicated for disinfecting solutions.    CC/Reason for Visit: Discuss ankle swelling  HPI: Brandi Spence is a 74 y.o. female who is coming in today for the above mentioned reasons. Past Medical History is significant for: Hypertension, hyperlipidemia, hypothyroidism, peripheral neuropathy, chronic pain syndrome on Lyrica and hydrocodone as prescribed by Dr. Hardin Negus with pain management.  She was also diagnosed with impaired glucose tolerance with an A1c of 6 back in January 2020 as well as vitamin D deficiency.  She has been dealing with some left ear hearing difficulty was finally diagnosed with Mnire's disease.  She was placed on Maxide.  Her ENT has decided to slowly transition her off Maxide and she has noticed that and she has been weaning off of it her right leg has become more swollen.  She does have a history of chronic venous insufficiency and has had sclerotherapy on the right extremity.  In addition to [facility] she is also on lisinopril 20/HCTZ 25 which makes her daily HCTZ dose 50mg .  I also have some concerns about her being on lisinopril, triamterene (which is a potassium sparing diuretic) in addition to potassium supplementation.  Her potassium level was normal during her last visit at 4.7.   Past Medical/Surgical History: Past Medical History:  Diagnosis Date  . Hyperlipidemia   . Hypertension   . Hypothyroidism   . Kidney stones   . Neuropathy    feet    Past Surgical History:  Procedure Laterality Date  . ABDOMINAL HYSTERECTOMY  2005  . BREAST ENHANCEMENT SURGERY Bilateral 2000  . BUNIONECTOMY Right 2013  . CARPAL TUNNEL RELEASE Right 1997  . COLONOSCOPY   01/01/2013  . 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 KNEE ARTHROSCOPY Right 2006   2012  . PAROTID GLAND TUMOR EXCISION  2009  . POLYPECTOMY    . SPINAL CORD STIMULATOR IMPLANT Left 2012  . TARSAL TUNNEL RELEASE Right 2005    Social History:  reports that she quit smoking about 39 years ago. She has never used smokeless tobacco. She reports current alcohol use of about 1.0 standard drinks of alcohol per week. She reports that she does not use drugs.  Allergies: Allergies  Allergen Reactions  . Prednisone Other (See Comments)    Indigestion, dizziness    Family History:  Family History  Problem Relation Age of Onset  . Colon cancer Father 25  . Colon polyps Father   . Esophageal cancer Neg Hx   . Rectal cancer Neg Hx   . Stomach cancer Neg Hx      Current Outpatient Medications:  .  aspirin 81 MG EC tablet, Take 81 mg by mouth daily.  , Disp: , Rfl:  .  bacitracin-polymyxin b (POLYSPORIN) ophthalmic ointment, Place 1 application into the right eye 4 (four) times daily. apply to eye every 12 hours while awake, Disp: 3.5 g, Rfl: 0 .  cetirizine (ZYRTEC) 10 MG tablet, Take 10 mg by mouth daily., Disp: , Rfl:  .  Cholecalciferol (VITAMIN D3 PO), Take 2,000 Units by mouth daily., Disp: , Rfl:  .  diphenhydrAMINE HCl, Sleep, (SLEEP-AID) 50 MG CAPS, Take by mouth., Disp: , Rfl:  .  docusate sodium (COLACE) 100 MG capsule, Take 200 mg by mouth daily., Disp: , Rfl:  .  FIBER PO, Take 500 mg by mouth daily., Disp: , Rfl:  .  HYDROcodone-acetaminophen (NORCO/VICODIN) 5-325 MG tablet, Take 1 tablet by mouth every 6 (six) hours as needed for moderate pain. Pain management - Dr Hardin Negus, Disp: , Rfl:  .  levothyroxine (SYNTHROID, LEVOTHROID) 75 MCG tablet, Take 75 mcg by mouth daily before breakfast., Disp: , Rfl:  .  potassium chloride (MICRO-K) 10 MEQ CR capsule, potassium chloride ER 10 mEq capsule,extended release, Disp: , Rfl:  .  pregabalin (LYRICA) 150 MG  capsule, Take 150 mg by mouth 2 (two) times daily. Pain management, Disp: , Rfl:  .  simvastatin (ZOCOR) 20 MG tablet, TAKE 1 TABLET BY MOUTH EVERYDAY AT BEDTIME, Disp: 90 tablet, Rfl: 1 .  triamterene-hydrochlorothiazide (DYAZIDE) 37.5-25 MG capsule, triamterene 37.5 mg-hydrochlorothiazide 25 mg capsule, Disp: , Rfl:  .  lisinopril (ZESTRIL) 20 MG tablet, Take 0.5 tablets (10 mg total) by mouth daily., Disp: 90 tablet, Rfl: 1  Review of Systems:  Constitutional: Denies fever, chills, diaphoresis, appetite change and fatigue.  HEENT: Denies photophobia, eye pain, redness, hearing loss, ear pain, congestion, sore throat, rhinorrhea, sneezing, mouth sores, trouble swallowing, neck pain, neck stiffness and tinnitus.   Respiratory: Denies SOB, DOE, cough, chest tightness,  and wheezing.   Cardiovascular: Denies chest pain, palpitations. Gastrointestinal: Denies nausea, vomiting, abdominal pain, diarrhea, constipation, blood in stool and abdominal distention.  Genitourinary: Denies dysuria, urgency, frequency, hematuria, flank pain and difficulty urinating.  Endocrine: Denies: hot or cold intolerance, sweats, changes in hair or nails, polyuria, polydipsia. Musculoskeletal: Denies myalgias, back pain, joint swelling, arthralgias and gait problem.  Skin: Denies pallor, rash and wound.  Neurological: Denies dizziness, seizures, syncope, weakness, light-headedness, numbness and headaches.  Hematological: Denies adenopathy. Easy bruising, personal or family bleeding history  Psychiatric/Behavioral: Denies suicidal ideation, mood changes, confusion, nervousness, sleep disturbance and agitation    Physical Exam: Vitals:   08/01/19 1104  BP: 110/70  Pulse: 89  Temp: (!) 97.2 F (36.2 C)  TempSrc: Temporal  SpO2: 98%  Weight: 198 lb 14.4 oz (90.2 kg)    Body mass index is 31.15 kg/m.   Constitutional: NAD, calm, comfortable Eyes: PERRL, lids and conjunctivae normal ENMT: Mucous membranes are  moist. Musculoskeletal: no clubbing / cyanosis. No joint deformity upper and lower extremities. Good ROM, no contractures. Normal muscle tone.  1+ pitting edema of the right ankle, spider veins and prominent veins throughout. Skin: Chronic venous insufficiency skin color changes Neurologic: Grossly intact and nonfocal Psychiatric: Normal judgment and insight. Alert and oriented x 3. Normal mood.    Impression and Plan:  Essential hypertension -Blood pressure is well controlled at present. -As she is on double the daily recommended dose of hydrochlorothiazide, I will discontinue lisinopril/HCT and prescribe lisinopril only.  I will continue her triamterene/HCTZ as this has had some benefit on her Mnire's disease. -Continue to monitor her potassium levels as she is on ACE inhibitor, potassium sparing diuretic in addition to potassium supplementation.  Last few checks it has been normal.  Venous insufficiency -Have advised low-salt diet, leg elevation when possible and use of compression stockings.   Patient Instructions  -Nice seeing you today!!  -Stop lisinopril/HCTZ.  -Start lisinopril 20 mg half a tablet daily.  -Take triamterene/HCTZ daily.     Lelon Frohlich, MD [facility]

## 2019-08-01 NOTE — Patient Instructions (Signed)
-  Nice seeing you today!!  -Stop lisinopril/HCTZ.  -Start lisinopril 20 mg half a tablet daily.  -Take triamterene/HCTZ daily.

## 2019-08-06 ENCOUNTER — Other Ambulatory Visit: Payer: Self-pay | Admitting: Endocrinology

## 2019-08-06 DIAGNOSIS — E039 Hypothyroidism, unspecified: Secondary | ICD-10-CM | POA: Diagnosis not present

## 2019-08-06 DIAGNOSIS — I10 Essential (primary) hypertension: Secondary | ICD-10-CM | POA: Diagnosis not present

## 2019-08-06 DIAGNOSIS — E041 Nontoxic single thyroid nodule: Secondary | ICD-10-CM | POA: Diagnosis not present

## 2019-08-06 DIAGNOSIS — E559 Vitamin D deficiency, unspecified: Secondary | ICD-10-CM | POA: Diagnosis not present

## 2019-08-06 DIAGNOSIS — G609 Hereditary and idiopathic neuropathy, unspecified: Secondary | ICD-10-CM | POA: Diagnosis not present

## 2019-08-06 DIAGNOSIS — M8580 Other specified disorders of bone density and structure, unspecified site: Secondary | ICD-10-CM | POA: Diagnosis not present

## 2019-08-06 DIAGNOSIS — R7301 Impaired fasting glucose: Secondary | ICD-10-CM | POA: Diagnosis not present

## 2019-08-19 DIAGNOSIS — M79604 Pain in right leg: Secondary | ICD-10-CM | POA: Diagnosis not present

## 2019-08-19 DIAGNOSIS — R600 Localized edema: Secondary | ICD-10-CM | POA: Diagnosis not present

## 2019-10-06 DIAGNOSIS — M7121 Synovial cyst of popliteal space [Baker], right knee: Secondary | ICD-10-CM | POA: Diagnosis not present

## 2019-10-06 DIAGNOSIS — S83241A Other tear of medial meniscus, current injury, right knee, initial encounter: Secondary | ICD-10-CM | POA: Diagnosis not present

## 2019-10-09 DIAGNOSIS — M25561 Pain in right knee: Secondary | ICD-10-CM | POA: Diagnosis not present

## 2019-10-28 DIAGNOSIS — M7121 Synovial cyst of popliteal space [Baker], right knee: Secondary | ICD-10-CM | POA: Diagnosis not present

## 2019-10-29 DIAGNOSIS — Z1231 Encounter for screening mammogram for malignant neoplasm of breast: Secondary | ICD-10-CM | POA: Diagnosis not present

## 2019-10-29 LAB — HM MAMMOGRAPHY

## 2019-10-31 DIAGNOSIS — M7121 Synovial cyst of popliteal space [Baker], right knee: Secondary | ICD-10-CM | POA: Diagnosis not present

## 2019-11-04 ENCOUNTER — Encounter: Payer: Self-pay | Admitting: Internal Medicine

## 2019-11-07 DIAGNOSIS — H905 Unspecified sensorineural hearing loss: Secondary | ICD-10-CM | POA: Diagnosis not present

## 2019-11-07 DIAGNOSIS — H903 Sensorineural hearing loss, bilateral: Secondary | ICD-10-CM | POA: Insufficient documentation

## 2019-11-07 DIAGNOSIS — H8102 Meniere's disease, left ear: Secondary | ICD-10-CM | POA: Diagnosis not present

## 2019-11-20 DIAGNOSIS — M7121 Synovial cyst of popliteal space [Baker], right knee: Secondary | ICD-10-CM | POA: Diagnosis not present

## 2019-12-02 DIAGNOSIS — G603 Idiopathic progressive neuropathy: Secondary | ICD-10-CM | POA: Diagnosis not present

## 2019-12-02 DIAGNOSIS — G894 Chronic pain syndrome: Secondary | ICD-10-CM | POA: Diagnosis not present

## 2019-12-02 DIAGNOSIS — Z79891 Long term (current) use of opiate analgesic: Secondary | ICD-10-CM | POA: Diagnosis not present

## 2019-12-02 DIAGNOSIS — M7741 Metatarsalgia, right foot: Secondary | ICD-10-CM | POA: Diagnosis not present

## 2019-12-02 DIAGNOSIS — M7742 Metatarsalgia, left foot: Secondary | ICD-10-CM | POA: Diagnosis not present

## 2019-12-08 DIAGNOSIS — M7121 Synovial cyst of popliteal space [Baker], right knee: Secondary | ICD-10-CM | POA: Diagnosis not present

## 2019-12-16 DIAGNOSIS — M23261 Derangement of other lateral meniscus due to old tear or injury, right knee: Secondary | ICD-10-CM | POA: Diagnosis not present

## 2019-12-16 DIAGNOSIS — G8918 Other acute postprocedural pain: Secondary | ICD-10-CM | POA: Diagnosis not present

## 2019-12-16 DIAGNOSIS — S83241A Other tear of medial meniscus, current injury, right knee, initial encounter: Secondary | ICD-10-CM | POA: Diagnosis not present

## 2019-12-16 DIAGNOSIS — M94261 Chondromalacia, right knee: Secondary | ICD-10-CM | POA: Diagnosis not present

## 2019-12-16 DIAGNOSIS — M23231 Derangement of other medial meniscus due to old tear or injury, right knee: Secondary | ICD-10-CM | POA: Diagnosis not present

## 2019-12-16 DIAGNOSIS — S83281A Other tear of lateral meniscus, current injury, right knee, initial encounter: Secondary | ICD-10-CM | POA: Diagnosis not present

## 2020-01-14 DIAGNOSIS — Z23 Encounter for immunization: Secondary | ICD-10-CM | POA: Diagnosis not present

## 2020-02-02 ENCOUNTER — Other Ambulatory Visit (HOSPITAL_COMMUNITY): Payer: Self-pay | Admitting: Orthopedic Surgery

## 2020-02-02 ENCOUNTER — Ambulatory Visit (HOSPITAL_COMMUNITY)
Admission: RE | Admit: 2020-02-02 | Discharge: 2020-02-02 | Disposition: A | Discharge status: Home / Self Care | Payer: Medicare Other | Source: Ambulatory Visit | Attending: Orthopedic Surgery | Admitting: Orthopedic Surgery

## 2020-02-02 ENCOUNTER — Other Ambulatory Visit: Payer: Self-pay

## 2020-02-02 DIAGNOSIS — M7989 Other specified soft tissue disorders: Secondary | ICD-10-CM

## 2020-02-02 DIAGNOSIS — S83241D Other tear of medial meniscus, current injury, right knee, subsequent encounter: Secondary | ICD-10-CM | POA: Diagnosis not present

## 2020-02-02 DIAGNOSIS — M79604 Pain in right leg: Secondary | ICD-10-CM

## 2020-02-02 DIAGNOSIS — S83281D Other tear of lateral meniscus, current injury, right knee, subsequent encounter: Secondary | ICD-10-CM | POA: Diagnosis not present

## 2020-02-02 NOTE — Progress Notes (Signed)
VASCULAR LAB    Right lower extremity venous duplex has been performed.  See CV proc for preliminary results.  [address] PA-C   [place], [place], RVT 02/02/2020, 4:18 PM

## 2020-02-20 DIAGNOSIS — S83281D Other tear of lateral meniscus, current injury, right knee, subsequent encounter: Secondary | ICD-10-CM | POA: Diagnosis not present

## 2020-02-20 DIAGNOSIS — S83241D Other tear of medial meniscus, current injury, right knee, subsequent encounter: Secondary | ICD-10-CM | POA: Diagnosis not present

## 2020-03-09 ENCOUNTER — Other Ambulatory Visit: Payer: Self-pay | Admitting: Internal Medicine

## 2020-03-09 DIAGNOSIS — E7800 Pure hypercholesterolemia, unspecified: Secondary | ICD-10-CM

## 2020-03-24 ENCOUNTER — Ambulatory Visit (INDEPENDENT_AMBULATORY_CARE_PROVIDER_SITE_OTHER): Payer: Medicare Other | Admitting: Podiatry

## 2020-03-24 ENCOUNTER — Other Ambulatory Visit: Payer: Self-pay

## 2020-03-24 DIAGNOSIS — G629 Polyneuropathy, unspecified: Secondary | ICD-10-CM | POA: Diagnosis not present

## 2020-03-24 DIAGNOSIS — L600 Ingrowing nail: Secondary | ICD-10-CM | POA: Diagnosis not present

## 2020-03-24 DIAGNOSIS — L03031 Cellulitis of right toe: Secondary | ICD-10-CM | POA: Diagnosis not present

## 2020-03-24 MED ORDER — DOXYCYCLINE HYCLATE 100 MG PO TABS: 100.0000 mg | ORAL_TABLET | Freq: Two times a day (BID) | ORAL | 0 refills | Status: DC

## 2020-03-24 NOTE — Progress Notes (Signed)
Subjective:   Patient ID: Brandi Spence, female   DOB: 75 y.o.   MRN: [id number]   HPI Patient presents stating she has developed a lot of redness and damage to the right hallux nail bed and thinks it is infected but she has neuropathy and cannot feel anything.  No history of why she has neuropathy with different test being inconclusive and she does take Lyrica and Vicodin as needed.  Patient does not smoke and likes to be active.  She states this is been this way for at least 1 week   Review of Systems  All other systems reviewed and are negative.       Objective:  Physical Exam Vitals and nursing note reviewed.  Constitutional:      Appearance: She is well-developed and well-nourished.  Cardiovascular:     Pulses: Intact distal pulses.  Pulmonary:     Effort: Pulmonary effort is normal.  Musculoskeletal:        General: Normal range of motion.  Skin:    General: Skin is warm.  Neurological:     Mental Status: She is alert.     Neurovascular status was found to be intact with diminishment sharp dull vibratory bilateral.  No balance issues noted and patient has a damaged right hallux nail with redness surrounding it both on the medial lateral and center proximal portion of the bed.  It is draining to a low degree at this current time but it is localized with the nail being damaged and loose     Assessment:  Probability for trauma to the right hallux nail with neuropathy entering into the picture with probability for low-grade localized and proximal     Plan:  H&P reviewed paronychia infection probable trauma neuropathy and ingrown toenail.  At this point I infiltrated the right hallux 60 mg like Marcaine mixture sterile prep done and using sterile instrumentation I remove the nail plate I flushed the bed and I did not note any proximal extension.  Placed on doxycycline twice daily gave instructions on soaks and patient will be seen back to recheck

## 2020-03-24 NOTE — Patient Instructions (Signed)

## 2020-04-08 DIAGNOSIS — M1812 Unilateral primary osteoarthritis of first carpometacarpal joint, left hand: Secondary | ICD-10-CM | POA: Diagnosis not present

## 2020-04-08 DIAGNOSIS — G5602 Carpal tunnel syndrome, left upper limb: Secondary | ICD-10-CM | POA: Diagnosis not present

## 2020-04-09 ENCOUNTER — Encounter: Payer: Self-pay | Admitting: Internal Medicine

## 2020-04-13 MED ORDER — TRAZODONE HCL 50 MG PO TABS: ORAL_TABLET | ORAL | 2 refills | Status: DC

## 2020-05-06 ENCOUNTER — Other Ambulatory Visit: Payer: Self-pay | Admitting: Internal Medicine

## 2020-05-25 DIAGNOSIS — G894 Chronic pain syndrome: Secondary | ICD-10-CM | POA: Diagnosis not present

## 2020-05-25 DIAGNOSIS — M7741 Metatarsalgia, right foot: Secondary | ICD-10-CM | POA: Diagnosis not present

## 2020-05-25 DIAGNOSIS — M7742 Metatarsalgia, left foot: Secondary | ICD-10-CM | POA: Diagnosis not present

## 2020-05-25 DIAGNOSIS — G603 Idiopathic progressive neuropathy: Secondary | ICD-10-CM | POA: Diagnosis not present

## 2020-06-16 ENCOUNTER — Other Ambulatory Visit: Payer: Self-pay | Admitting: Internal Medicine

## 2020-06-16 DIAGNOSIS — I10 Essential (primary) hypertension: Secondary | ICD-10-CM

## 2020-06-29 DIAGNOSIS — M1711 Unilateral primary osteoarthritis, right knee: Secondary | ICD-10-CM | POA: Diagnosis not present

## 2020-06-30 DIAGNOSIS — Z23 Encounter for immunization: Secondary | ICD-10-CM | POA: Diagnosis not present

## 2020-07-07 ENCOUNTER — Other Ambulatory Visit: Payer: Self-pay | Admitting: Endocrinology

## 2020-07-07 DIAGNOSIS — E041 Nontoxic single thyroid nodule: Secondary | ICD-10-CM

## 2020-07-09 DIAGNOSIS — M1711 Unilateral primary osteoarthritis, right knee: Secondary | ICD-10-CM | POA: Diagnosis not present

## 2020-07-13 ENCOUNTER — Ambulatory Visit
Admission: RE | Admit: 2020-07-13 | Discharge: 2020-07-13 | Disposition: A | Discharge status: Home / Self Care | Payer: Federal, State, Local not specified - PPO | Source: Ambulatory Visit | Attending: Endocrinology | Admitting: Endocrinology

## 2020-07-13 DIAGNOSIS — E041 Nontoxic single thyroid nodule: Secondary | ICD-10-CM | POA: Diagnosis not present

## 2020-07-17 ENCOUNTER — Other Ambulatory Visit: Payer: Self-pay | Admitting: Internal Medicine

## 2020-07-17 DIAGNOSIS — E7800 Pure hypercholesterolemia, unspecified: Secondary | ICD-10-CM

## 2020-08-04 DIAGNOSIS — E039 Hypothyroidism, unspecified: Secondary | ICD-10-CM | POA: Diagnosis not present

## 2020-08-04 DIAGNOSIS — E559 Vitamin D deficiency, unspecified: Secondary | ICD-10-CM | POA: Diagnosis not present

## 2020-08-04 DIAGNOSIS — R7301 Impaired fasting glucose: Secondary | ICD-10-CM | POA: Diagnosis not present

## 2020-08-05 LAB — HEMOGLOBIN A1C: Hemoglobin A1C: 6

## 2020-08-05 LAB — BASIC METABOLIC PANEL
BUN: 15 (ref 4–21)
Creatinine: 0.9 (ref 0.5–1.1)
Glucose: 125
Potassium: 4.6 (ref 3.4–5.3)
Sodium: 142 (ref 137–147)

## 2020-08-05 LAB — VITAMIN D 25 HYDROXY (VIT D DEFICIENCY, FRACTURES): Vit D, 25-Hydroxy: 30.6

## 2020-08-05 LAB — COMPREHENSIVE METABOLIC PANEL: Calcium: 9.5 (ref 8.7–10.7)

## 2020-08-05 LAB — TSH: TSH: 0.77 (ref 0.41–5.90)

## 2020-08-10 DIAGNOSIS — M1711 Unilateral primary osteoarthritis, right knee: Secondary | ICD-10-CM | POA: Diagnosis not present

## 2020-08-11 DIAGNOSIS — G609 Hereditary and idiopathic neuropathy, unspecified: Secondary | ICD-10-CM | POA: Diagnosis not present

## 2020-08-11 DIAGNOSIS — E559 Vitamin D deficiency, unspecified: Secondary | ICD-10-CM | POA: Diagnosis not present

## 2020-08-11 DIAGNOSIS — I10 Essential (primary) hypertension: Secondary | ICD-10-CM | POA: Diagnosis not present

## 2020-08-11 DIAGNOSIS — R7301 Impaired fasting glucose: Secondary | ICD-10-CM | POA: Diagnosis not present

## 2020-08-11 DIAGNOSIS — E039 Hypothyroidism, unspecified: Secondary | ICD-10-CM | POA: Diagnosis not present

## 2020-08-11 DIAGNOSIS — M8580 Other specified disorders of bone density and structure, unspecified site: Secondary | ICD-10-CM | POA: Diagnosis not present

## 2020-08-11 DIAGNOSIS — E041 Nontoxic single thyroid nodule: Secondary | ICD-10-CM | POA: Diagnosis not present

## 2020-08-17 DIAGNOSIS — M1711 Unilateral primary osteoarthritis, right knee: Secondary | ICD-10-CM | POA: Diagnosis not present

## 2020-08-24 DIAGNOSIS — M1711 Unilateral primary osteoarthritis, right knee: Secondary | ICD-10-CM | POA: Diagnosis not present

## 2020-08-26 DIAGNOSIS — M1711 Unilateral primary osteoarthritis, right knee: Secondary | ICD-10-CM | POA: Diagnosis not present

## 2020-09-21 DIAGNOSIS — M1611 Unilateral primary osteoarthritis, right hip: Secondary | ICD-10-CM | POA: Diagnosis not present

## 2020-09-30 ENCOUNTER — Telehealth: Payer: Self-pay | Admitting: Internal Medicine

## 2020-09-30 NOTE — Telephone Encounter (Signed)
Spoke with patient and explained that she will need an office visit for surgical clearance.  Appointment scheduled.

## 2020-09-30 NOTE — Telephone Encounter (Signed)
Pt is returning the call in stating that she would like to know the natural of the call.  Pt would like to have a call back.

## 2020-10-28 DIAGNOSIS — M1711 Unilateral primary osteoarthritis, right knee: Secondary | ICD-10-CM | POA: Diagnosis not present

## 2020-11-03 ENCOUNTER — Other Ambulatory Visit: Payer: Self-pay

## 2020-11-04 ENCOUNTER — Ambulatory Visit (INDEPENDENT_AMBULATORY_CARE_PROVIDER_SITE_OTHER): Payer: Medicare Other | Admitting: Internal Medicine

## 2020-11-04 ENCOUNTER — Encounter: Payer: Self-pay | Admitting: Internal Medicine

## 2020-11-04 VITALS — BP 108/78 | HR 72 | Temp 98.1°F | Wt 191.0 lb

## 2020-11-04 DIAGNOSIS — E039 Hypothyroidism, unspecified: Secondary | ICD-10-CM

## 2020-11-04 DIAGNOSIS — E7800 Pure hypercholesterolemia, unspecified: Secondary | ICD-10-CM

## 2020-11-04 DIAGNOSIS — R7302 Impaired glucose tolerance (oral): Secondary | ICD-10-CM

## 2020-11-04 DIAGNOSIS — Z23 Encounter for immunization: Secondary | ICD-10-CM

## 2020-11-04 DIAGNOSIS — I10 Essential (primary) hypertension: Secondary | ICD-10-CM

## 2020-11-04 DIAGNOSIS — Z01818 Encounter for other preprocedural examination: Secondary | ICD-10-CM

## 2020-11-04 NOTE — Progress Notes (Signed)
Established Patient Office Visit     This visit occurred during the SARS-CoV-2 public health emergency.  Safety protocols were in place, including screening questions prior to the visit, additional usage of staff PPE, and extensive cleaning of exam room while observing appropriate contact time as indicated for disinfecting solutions.    CC/Reason for Visit: Preoperative clearance  HPI: Brandi Spence is a 75 y.o. female who is coming in today for the above mentioned reasons. Past Medical History is significant for: Hypertension, hyperlipidemia, hypothyroidism, chronic pain syndrome and impaired glucose tolerance.  I have not seen her since April 2021.  She is scheduled to have a right knee replacement due to osteoarthritis and a problematic Baker's cyst.  They are requesting preoperative clearance.  Patient is feeling well and has no concerns.  She is somewhat deconditioned and has some mild shortness of breath when climbing steps but this is not changed in years.  She does not have chest pain with exertion or at rest.  She states she had a recent lab work with her endocrinologist and will forward those to me to avoid duplication of service.   Past Medical/Surgical History: Past Medical History:  Diagnosis Date   Hyperlipidemia    Hypertension    Hypothyroidism    Kidney stones    Neuropathy    feet    Past Surgical History:  Procedure Laterality Date   ABDOMINAL HYSTERECTOMY  2005   BREAST ENHANCEMENT SURGERY Bilateral 2000   BUNIONECTOMY Right 2013   CARPAL TUNNEL RELEASE Right 1997   COLONOSCOPY  01/01/2013   EXCISION MORTON'S NEUROMA Bilateral 1980   right foot, 2004   KNEE ARTHROSCOPY Right 2006   2012   PAROTID GLAND TUMOR EXCISION  2009   POLYPECTOMY     SPINAL CORD STIMULATOR IMPLANT Left 2012   TARSAL TUNNEL RELEASE Right 2005    Social History:  reports that she quit smoking about 40 years ago. Her smoking use included cigarettes. She has never used  smokeless tobacco. She reports current alcohol use of about 1.0 standard drink per week. She reports that she does not use drugs.  Allergies: Allergies  Allergen Reactions   Prednisone Other (See Comments)    Indigestion, dizziness    Family History:  Family History  Problem Relation Age of Onset   Colon cancer Father 27   Colon polyps Father    Esophageal cancer Neg Hx    Rectal cancer Neg Hx    Stomach cancer Neg Hx      Current Outpatient Medications:    aspirin 81 MG EC tablet, Take 81 mg by mouth daily.  , Disp: , Rfl:    celecoxib (CELEBREX) 200 MG capsule, Take 200 mg by mouth daily., Disp: , Rfl:    cetirizine (ZYRTEC) 10 MG tablet, Take 10 mg by mouth daily., Disp: , Rfl:    docusate sodium (COLACE) 100 MG capsule, Take 200 mg by mouth daily., Disp: , Rfl:    FIBER PO, Take 500 mg by mouth daily., Disp: , Rfl:    HYDROcodone-acetaminophen (NORCO/VICODIN) 5-325 MG tablet, Take 1 tablet by mouth every 6 (six) hours as needed for moderate pain. Pain management - Dr Hardin Negus, Disp: , Rfl:    levothyroxine (SYNTHROID, LEVOTHROID) 75 MCG tablet, Take 75 mcg by mouth daily before breakfast., Disp: , Rfl:    lisinopril-hydrochlorothiazide (ZESTORETIC) 20-25 MG tablet, TAKE 1/2 TABLET BY MOUTH EVERY DAY, Disp: 45 tablet, Rfl: 3   Omega-3 Fatty Acids (FISH  OIL) 1200 MG CAPS, Take by mouth., Disp: , Rfl:    potassium chloride (MICRO-K) 10 MEQ CR capsule, potassium chloride ER 10 mEq capsule,extended release, Disp: , Rfl:    pregabalin (LYRICA) 150 MG capsule, Take 150 mg by mouth 2 (two) times daily. Pain management, Disp: , Rfl:    simvastatin (ZOCOR) 20 MG tablet, TAKE 1 TABLET BY MOUTH EVERYDAY AT BEDTIME, Disp: 90 tablet, Rfl: 0   traZODone (DESYREL) 50 MG tablet, TAKE 1/2 TABLET BY MOUTH AS NEEDED AT BEDTIME, Disp: 45 tablet, Rfl: 1  Review of Systems:  Constitutional: Denies fever, chills, diaphoresis, appetite change and fatigue.  HEENT: Denies photophobia, eye pain,  redness, hearing loss, ear pain, congestion, sore throat, rhinorrhea, sneezing, mouth sores, trouble swallowing, neck pain, neck stiffness and tinnitus.   Respiratory: Denies SOB, DOE, cough, chest tightness,  and wheezing.   Cardiovascular: Denies chest pain, palpitations and leg swelling.  Gastrointestinal: Denies nausea, vomiting, abdominal pain, diarrhea, constipation, blood in stool and abdominal distention.  Genitourinary: Denies dysuria, urgency, frequency, hematuria, flank pain and difficulty urinating.  Endocrine: Denies: hot or cold intolerance, sweats, changes in hair or nails, polyuria, polydipsia. Musculoskeletal: Denies myalgias, back pain, joint swelling, arthralgias and gait problem.  Skin: Denies pallor, rash and wound.  Neurological: Denies dizziness, seizures, syncope, weakness, light-headedness, numbness and headaches.  Hematological: Denies adenopathy. Easy bruising, personal or family bleeding history  Psychiatric/Behavioral: Denies suicidal ideation, mood changes, confusion, nervousness, sleep disturbance and agitation    Physical Exam: Vitals:   11/04/20 1133  BP: 108/78  Pulse: 72  Temp: 98.1 F (36.7 C)  TempSrc: Oral  SpO2: 98%  Weight: 191 lb (86.6 kg)    Body mass index is 29.91 kg/m.   Constitutional: NAD, calm, comfortable Eyes: PERRL, lids and conjunctivae normal ENMT: Mucous membranes are moist.  Respiratory: clear to auscultation bilaterally, no wheezing, no crackles. Normal respiratory effort. No accessory muscle use.  Cardiovascular: Regular rate and rhythm, no murmurs / rubs / gallops. No extremity edema.  Neurologic: Grossly intact and nonfocal Psychiatric: Normal judgment and insight. Alert and oriented x 3. Normal mood.    Impression and Plan:  Preop examination  Hypertension, unspecified type - Plan: EKG 12-Lead  Hypercholesterolemia - Plan: EKG 12-Lead  Hypothyroidism, unspecified type  IGT (impaired glucose tolerance)  -EKG  done in office today and interpreted by myself as normal sinus rhythm with a rate of 70 and no acute ST or T wave changes. -Assuming recent lab work is unremarkable, especially A1c, TSH, kidney/liver function, it would be appropriate to proceed to surgery without further work-up as she would be considered low risk.  Time spent: 30 minutes reviewing chart, interviewing and examining patient and formulating plan of care.    Lelon Frohlich, MD [facility]

## 2020-11-04 NOTE — Addendum Note (Signed)
Addended by: Westley Hummer B on: 11/04/2020 04:20 PM   Modules accepted: Orders

## 2020-11-05 ENCOUNTER — Telehealth: Payer: Self-pay | Admitting: Internal Medicine

## 2020-11-05 DIAGNOSIS — H903 Sensorineural hearing loss, bilateral: Secondary | ICD-10-CM | POA: Diagnosis not present

## 2020-11-05 DIAGNOSIS — H9313 Tinnitus, bilateral: Secondary | ICD-10-CM | POA: Diagnosis not present

## 2020-11-05 DIAGNOSIS — H73892 Other specified disorders of tympanic membrane, left ear: Secondary | ICD-10-CM | POA: Diagnosis not present

## 2020-11-05 DIAGNOSIS — H8102 Meniere's disease, left ear: Secondary | ICD-10-CM | POA: Diagnosis not present

## 2020-11-05 DIAGNOSIS — H61893 Other specified disorders of external ear, bilateral: Secondary | ICD-10-CM | POA: Diagnosis not present

## 2020-11-05 DIAGNOSIS — H6123 Impacted cerumen, bilateral: Secondary | ICD-10-CM | POA: Diagnosis not present

## 2020-11-05 DIAGNOSIS — Z1231 Encounter for screening mammogram for malignant neoplasm of breast: Secondary | ICD-10-CM | POA: Diagnosis not present

## 2020-11-05 LAB — HM MAMMOGRAPHY

## 2020-11-05 NOTE — Telephone Encounter (Signed)
Patient is requesting for Dr.Hernandez to look in her chart to see if she could see the lab results from [place].  Patient can be contacted at [phone].  Please advise.

## 2020-11-05 NOTE — Telephone Encounter (Signed)
Left detailed message on machine for patient to drop of or fax a copy of her lab results.

## 2020-11-09 ENCOUNTER — Other Ambulatory Visit: Payer: Self-pay | Admitting: Internal Medicine

## 2020-11-09 DIAGNOSIS — E7800 Pure hypercholesterolemia, unspecified: Secondary | ICD-10-CM

## 2020-11-11 ENCOUNTER — Encounter: Payer: Self-pay | Admitting: Internal Medicine

## 2020-11-19 ENCOUNTER — Encounter: Payer: Self-pay | Admitting: Internal Medicine

## 2020-11-24 DIAGNOSIS — G603 Idiopathic progressive neuropathy: Secondary | ICD-10-CM | POA: Diagnosis not present

## 2020-11-24 DIAGNOSIS — G894 Chronic pain syndrome: Secondary | ICD-10-CM | POA: Diagnosis not present

## 2020-11-24 DIAGNOSIS — M7741 Metatarsalgia, right foot: Secondary | ICD-10-CM | POA: Diagnosis not present

## 2020-11-24 DIAGNOSIS — M7742 Metatarsalgia, left foot: Secondary | ICD-10-CM | POA: Diagnosis not present

## 2020-11-24 DIAGNOSIS — Z79891 Long term (current) use of opiate analgesic: Secondary | ICD-10-CM | POA: Diagnosis not present

## 2020-12-07 DIAGNOSIS — Z23 Encounter for immunization: Secondary | ICD-10-CM | POA: Diagnosis not present

## 2020-12-22 DIAGNOSIS — E559 Vitamin D deficiency, unspecified: Secondary | ICD-10-CM | POA: Diagnosis present

## 2021-01-04 NOTE — Patient Instructions (Signed)
DUE TO COVID-19 ONLY ONE VISITOR IS ALLOWED TO COME WITH YOU AND STAY IN THE WAITING ROOM ONLY DURING PRE OP AND PROCEDURE.   **NO VISITORS ARE ALLOWED IN THE SHORT STAY AREA OR RECOVERY ROOM!!**  IF YOU WILL BE ADMITTED INTO THE HOSPITAL YOU ARE ALLOWED ONLY TWO SUPPORT PEOPLE DURING VISITATION HOURS ONLY (7 AM -8PM)   The support person(s) must pass our screening, gel in and out, and wear a mask at all times, including in the patient's room. Patients must also wear a mask when staff or their support person are in the room. Visitors GUEST BADGE MUST BE WORN VISIBLY  One adult visitor may remain with you overnight and MUST be in the room by 8 P.M.  No visitors under the age of 60. Any visitor under the age of 38 must be accompanied by an adult.        Your procedure is scheduled on: 01/17/21   Report to [facility] Main Entrance    Report to short stay at: 5:15 AM   Call this number if you have problems the morning of surgery [phone]   Do not eat food :After Midnight.   May have liquids until : 4:15 AM   day of surgery  CLEAR LIQUID DIET  Foods Allowed                                                                     Foods Excluded  Water, Black Coffee and tea, regular and decaf                             liquids that you cannot  Plain Jell-O in any flavor  (No red)                                           see through such as: Fruit ices (not with fruit pulp)                                     milk, soups, orange juice              Iced Popsicles (No red)                                    All solid food                                   Apple juices Sports drinks like Gatorade (No red) Lightly seasoned clear broth or consume(fat free) Sugar  Sample Menu Breakfast                                Lunch  Supper Cranberry juice                    Beef broth                            Chicken broth Jell-O                                      Grape juice                           Apple juice Coffee or tea                        Jell-O                                      Popsicle                                                Coffee or tea                        Coffee or tea      Complete one Ensure drink the morning of surgery at : 4:15 AM      the day of surgery.    The day of surgery:  Drink ONE (1) Pre-Surgery Clear Ensure or G2 by am the morning of surgery. Drink in one sitting. Do not sip.  This drink was given to you during your hospital  pre-op appointment visit. Nothing else to drink after completing the  Pre-Surgery Clear Ensure or G2.          If you have questions, please contact your surgeon's office.    Oral Hygiene is also important to reduce your risk of infection.                                    Remember - BRUSH YOUR TEETH THE MORNING OF SURGERY WITH YOUR REGULAR TOOTHPASTE   Do NOT smoke after Midnight   Take these medicines the morning of surgery with A SIP OF WATER: pregabalin,cetirizine,levothyroxine.  DO NOT TAKE ANY ORAL DIABETIC MEDICATIONS DAY OF YOUR SURGERY                              You may not have any metal on your body including hair pins, jewelry, and body piercing             Do not wear make-up, lotions, powders, perfumes/cologne, or deodorant  Do not wear nail polish including gel and S&S, artificial/acrylic nails, or any other type of covering on natural nails including finger and toenails. If you have artificial nails, gel coating, etc. that needs to be removed by a nail salon please have this removed prior to surgery or surgery may need to be canceled/ delayed if the surgeon/ anesthesia feels like they are unable to be safely monitored.   Do not shave  48 hours prior to surgery.    Do not bring valuables to the hospital. [place].   Contacts, dentures or bridgework may not be worn into surgery.   Bring small  overnight bag day of surgery.    Patients discharged on the day of surgery will not be allowed to drive home.   Special Instructions: Bring a copy of your healthcare power of attorney and living will documents         the day of surgery if you haven't scanned them before.              Please read over the following fact sheets you were given: IF YOU HAVE QUESTIONS ABOUT YOUR PRE-OP INSTRUCTIONS PLEASE CALL [phone]     [facility] - Preparing for Surgery Before surgery, you can play an important role.  Because skin is not sterile, your skin needs to be as free of germs as possible.  You can reduce the number of germs on your skin by washing with CHG (chlorahexidine gluconate) soap before surgery.  CHG is an antiseptic cleaner which kills germs and bonds with the skin to continue killing germs even after washing. Please DO NOT use if you have an allergy to CHG or antibacterial soaps.  If your skin becomes reddened/irritated stop using the CHG and inform your nurse when you arrive at Short Stay. Do not shave (including legs and underarms) for at least 48 hours prior to the first CHG shower.  You may shave your face/neck. Please follow these instructions carefully:  1.  Shower with CHG Soap the night before surgery and the  morning of Surgery.  2.  If you choose to wash your hair, wash your hair first as usual with your  normal  shampoo.  3.  After you shampoo, rinse your hair and body thoroughly to remove the  shampoo.                           4.  Use CHG as you would any other liquid soap.  You can apply chg directly  to the skin and wash                       Gently with a scrungie or clean washcloth.  5.  Apply the CHG Soap to your body ONLY FROM THE NECK DOWN.   Do not use on face/ open                           Wound or open sores. Avoid contact with eyes, ears mouth and genitals (private parts).                       Wash face,  Genitals (private parts) with your normal soap.              6.  Wash thoroughly, paying special attention to the area where your surgery  will be performed.  7.  Thoroughly rinse your body with warm water from the neck down.  8.  DO NOT shower/wash with your normal soap after using and rinsing off  the CHG Soap.                9.  Pat yourself dry with a clean towel.  10.  Wear clean pajamas.            11.  Place clean sheets on your bed the night of your first shower and do not  sleep with pets. Day of Surgery : Do not apply any lotions/deodorants the morning of surgery.  Please wear clean clothes to the hospital/surgery center.  FAILURE TO FOLLOW THESE INSTRUCTIONS MAY RESULT IN THE CANCELLATION OF YOUR SURGERY PATIENT SIGNATURE_________________________________  NURSE SIGNATURE__________________________________  ________________________________________________________________________   Brandi Spence  An incentive spirometer is a tool that can help keep your lungs clear and active. This tool measures how well you are filling your lungs with each breath. Taking long deep breaths may help reverse or decrease the chance of developing breathing (pulmonary) problems (especially infection) following: A long period of time when you are unable to move or be active. BEFORE THE PROCEDURE  If the spirometer includes an indicator to show your best effort, your nurse or respiratory therapist will set it to a desired goal. If possible, sit up straight or lean slightly forward. Try not to slouch. Hold the incentive spirometer in an upright position. INSTRUCTIONS FOR USE  Sit on the edge of your bed if possible, or sit up as far as you can in bed or on a chair. Hold the incentive spirometer in an upright position. Breathe out normally. Place the mouthpiece in your mouth and seal your lips tightly around it. Breathe in slowly and as deeply as possible, raising the piston or the ball toward the top of the column. Hold your breath for 3-5  seconds or for as long as possible. Allow the piston or ball to fall to the bottom of the column. Remove the mouthpiece from your mouth and breathe out normally. Rest for a few seconds and repeat Steps 1 through 7 at least 10 times every 1-2 hours when you are awake. Take your time and take a few normal breaths between deep breaths. The spirometer may include an indicator to show your best effort. Use the indicator as a goal to work toward during each repetition. After each set of 10 deep breaths, practice coughing to be sure your lungs are clear. If you have an incision (the cut made at the time of surgery), support your incision when coughing by placing a pillow or rolled up towels firmly against it. Once you are able to get out of bed, walk around indoors and cough well. You may stop using the incentive spirometer when instructed by your caregiver.  RISKS AND COMPLICATIONS Take your time so you do not get dizzy or light-headed. If you are in pain, you may need to take or ask for pain medication before doing incentive spirometry. It is harder to take a deep breath if you are having pain. AFTER USE Rest and breathe slowly and easily. It can be helpful to keep track of a log of your progress. Your caregiver can provide you with a simple table to help with this. If you are using the spirometer at home, follow these instructions: [place] IF:  You are having difficultly using the spirometer. You have trouble using the spirometer as often as instructed. Your pain medication is not giving enough relief while using the spirometer. You develop fever of 100.5 F (38.1 C) or higher. SEEK IMMEDIATE MEDICAL CARE IF:  You cough up bloody sputum that had not been present before. You develop fever of 102 F (38.9 C) or greater. You develop worsening pain at or near  the incision site. MAKE SURE YOU:  Understand these instructions. Will watch your condition. Will get help right away if you are  not doing well or get worse. Document Released: 07/03/2006 Document Revised: 05/15/2011 Document Reviewed: 09/03/2006 [facility] Patient Information 2014 [place], [place].   ________________________________________________________________________

## 2021-01-05 ENCOUNTER — Encounter (HOSPITAL_COMMUNITY)
Admission: RE | Admit: 2021-01-05 | Discharge: 2021-01-05 | Disposition: A | Discharge status: Home / Self Care | Payer: Medicare Other | Source: Ambulatory Visit | Attending: Orthopedic Surgery | Admitting: Orthopedic Surgery

## 2021-01-05 ENCOUNTER — Other Ambulatory Visit: Payer: Self-pay

## 2021-01-05 ENCOUNTER — Encounter (HOSPITAL_COMMUNITY): Payer: Self-pay | Admitting: Orthopedic Surgery

## 2021-01-05 ENCOUNTER — Encounter (HOSPITAL_COMMUNITY): Payer: Self-pay

## 2021-01-05 VITALS — BP 116/81 | HR 78 | Temp 97.7°F | Ht 67.0 in | Wt 197.0 lb

## 2021-01-05 DIAGNOSIS — R300 Dysuria: Secondary | ICD-10-CM | POA: Insufficient documentation

## 2021-01-05 DIAGNOSIS — Z01812 Encounter for preprocedural laboratory examination: Secondary | ICD-10-CM | POA: Insufficient documentation

## 2021-01-05 DIAGNOSIS — G8929 Other chronic pain: Secondary | ICD-10-CM | POA: Insufficient documentation

## 2021-01-05 DIAGNOSIS — M25561 Pain in right knee: Secondary | ICD-10-CM | POA: Diagnosis not present

## 2021-01-05 DIAGNOSIS — R7303 Prediabetes: Secondary | ICD-10-CM | POA: Diagnosis not present

## 2021-01-05 DIAGNOSIS — M1711 Unilateral primary osteoarthritis, right knee: Secondary | ICD-10-CM | POA: Diagnosis not present

## 2021-01-05 DIAGNOSIS — Z01818 Encounter for other preprocedural examination: Secondary | ICD-10-CM

## 2021-01-05 HISTORY — DX: Prediabetes: R73.03

## 2021-01-05 HISTORY — DX: Unilateral primary osteoarthritis, right knee: M17.11

## 2021-01-05 HISTORY — DX: Personal history of urinary calculi: Z87.442

## 2021-01-05 HISTORY — DX: Unspecified osteoarthritis, unspecified site: M19.90

## 2021-01-05 LAB — SURGICAL PCR SCREEN
MRSA, PCR: NEGATIVE
Staphylococcus aureus: POSITIVE — AB

## 2021-01-05 LAB — COMPREHENSIVE METABOLIC PANEL
ALT: 39 U/L (ref 0–44)
AST: 33 U/L (ref 15–41)
Albumin: 4.1 g/dL (ref 3.5–5.0)
Alkaline Phosphatase: 53 U/L (ref 38–126)
Anion gap: 6 (ref 5–15)
BUN: 16 mg/dL (ref 8–23)
CO2: 29 mmol/L (ref 22–32)
Calcium: 8.9 mg/dL (ref 8.9–10.3)
Chloride: 103 mmol/L (ref 98–111)
Creatinine, Ser: 0.78 mg/dL (ref 0.44–1.00)
GFR, Estimated: >60 mL/min (ref >60)
Glucose, Bld: 96 mg/dL (ref 70–99)
Potassium: 3.7 mmol/L (ref 3.5–5.1)
Sodium: 138 mmol/L (ref 135–145)
Total Bilirubin: 0.9 mg/dL (ref 0.3–1.2)
Total Protein: 6.7 g/dL (ref 6.5–8.1)

## 2021-01-05 LAB — CBC WITH DIFFERENTIAL/PLATELET
Abs Immature Granulocytes: 0.02 10*3/uL (ref 0.00–0.07)
Basophils Absolute: 0.1 10*3/uL (ref 0.0–0.1)
Basophils Relative: 2 %
Eosinophils Absolute: 0.2 10*3/uL (ref 0.0–0.5)
Eosinophils Relative: 4 %
HCT: 43.6 % (ref 36.0–46.0)
Hemoglobin: 14.6 g/dL (ref 12.0–15.0)
Immature Granulocytes: 0 %
Lymphocytes Relative: 45 %
Lymphs Abs: 2.1 10*3/uL (ref 0.7–4.0)
MCH: 30.4 pg (ref 26.0–34.0)
MCHC: 33.5 g/dL (ref 30.0–36.0)
MCV: 90.6 fL (ref 80.0–100.0)
Monocytes Absolute: 0.5 10*3/uL (ref 0.1–1.0)
Monocytes Relative: 11 %
Neutro Abs: 1.8 10*3/uL (ref 1.7–7.7)
Neutrophils Relative %: 38 %
Platelets: 229 10*3/uL (ref 150–400)
RBC: 4.81 MIL/uL (ref 3.87–5.11)
RDW: 13.7 % (ref 11.5–15.5)
WBC: 4.6 10*3/uL (ref 4.0–10.5)
nRBC: 0 % (ref 0.0–0.2)

## 2021-01-05 LAB — HEMOGLOBIN A1C
Hgb A1c MFr Bld: 5.7 % — ABNORMAL HIGH (ref 4.8–5.6)
Mean Plasma Glucose: 116.89 mg/dL

## 2021-01-05 LAB — GLUCOSE, CAPILLARY: Glucose-Capillary: 92 mg/dL (ref 70–99)

## 2021-01-05 NOTE — Progress Notes (Signed)
COVID Vaccine Completed: Yes Date COVID Vaccine completed: 06/05/20 x 4 COVID vaccine manufacturer:  Moderna   COVID Test: N/A  PCP - Dr. Rande Lawman. LOV: 11/04/20 Cardiologist -   Chest x-ray -  EKG - 11/04/20. EPIC Stress Test -  ECHO -  Cardiac Cath -  Pacemaker/ICD device last checked:  Sleep Study -  CPAP -   Fasting Blood Sugar -  Checks Blood Sugar _____ times a day  Blood Thinner Instructions: Aspirin Instructions: Has been held since 01/02/21 Last Dose:  Anesthesia review: Hx: HTN  Patient denies shortness of breath, fever, cough and chest pain at PAT appointment   Patient verbalized understanding of instructions that were given to them at the PAT appointment. Patient was also instructed that they will need to review over the PAT instructions again at home before surgery.

## 2021-01-05 NOTE — H&P (Addendum)
TOTAL KNEE ADMISSION H&P  Patient is being admitted for right total knee arthroplasty.  Subjective:  Chief Complaint:right knee pain.  HPI: Brandi Spence, 75 y.o. female, has a history of pain and functional disability in the right knee due to arthritis and has failed non-surgical conservative treatments for greater than 12 weeks to includeNSAID's and/or analgesics, corticosteriod injections, viscosupplementation injections, flexibility and strengthening excercises, supervised PT with diminished ADL's post treatment, use of assistive devices, weight reduction as appropriate, and activity modification.  Onset of symptoms was gradual, starting 10 years ago with gradually worsening course since that time. The patient noted prior procedures on the knee to include  arthroscopy and menisectomy on the right knee(s).  Patient currently rates pain in the right knee(s) at 10 out of 10 with activity. Patient has night pain, worsening of pain with activity and weight bearing, pain that interferes with activities of daily living, crepitus, and joint swelling.  Patient has evidence of subchondral sclerosis, periarticular osteophytes, and joint space narrowing by imaging studies. . There is no active infection.  Patient Active Problem List   Diagnosis Date Noted   Primary localized osteoarthritis of right knee 01/05/2021   Vitamin D deficiency 12/22/2020   Hypothyroidism 03/28/2018   Tenosynovitis of right wrist 02/07/2018   Routine general medical examination at a health care facility 02/14/2016   Chronic pain syndrome 04/21/2014   Neuropathy of both feet 04/21/2014   Impacted cerumen of both ears 04/10/2013   Hereditary and idiopathic peripheral neuropathy 12/13/2007   NEPHROLITHIASIS, HX OF 12/13/2007   GOITER, NONTOXIC MULTINODULAR 10/12/2006   Hypercholesterolemia 10/12/2006   Essential hypertension 09/19/2006   GERD 09/19/2006   Past Medical History:  Diagnosis Date   Arthritis    History of  kidney stones    Hyperlipidemia    Hypertension    Hypothyroidism    Neuropathy    feet   Pre-diabetes    Primary localized osteoarthritis of right knee 01/05/2021    Past Surgical History:  Procedure Laterality Date   ABDOMINAL HYSTERECTOMY  2005   BREAST ENHANCEMENT SURGERY Bilateral 2000   BUNIONECTOMY Right 2013   CARPAL TUNNEL RELEASE Right 1997   COLONOSCOPY  01/01/2013   EXCISION MORTON'S NEUROMA Bilateral 1980   right foot, 2004   KNEE ARTHROSCOPY Right 2006   2012   PAROTID GLAND TUMOR EXCISION  2009   POLYPECTOMY     SPINAL CORD STIMULATOR IMPLANT Left 2012   TARSAL TUNNEL RELEASE Right 2005   TONSILLECTOMY      No current facility-administered medications for this encounter.   Current Outpatient Medications  Medication Sig Dispense Refill Last Dose   aspirin 81 MG EC tablet Take 81 mg by mouth daily.        celecoxib (CELEBREX) 200 MG capsule Take 200 mg by mouth daily.      cetirizine (ZYRTEC) 10 MG tablet Take 10 mg by mouth daily.      docusate sodium (COLACE) 100 MG capsule Take 200 mg by mouth at bedtime.      FIBER PO Take 1 tablet by mouth at bedtime.      HYDROcodone-acetaminophen (NORCO/VICODIN) 5-325 MG tablet Take 1 tablet by mouth at bedtime. Pain management - Dr Hardin Negus      levothyroxine (SYNTHROID, LEVOTHROID) 75 MCG tablet Take 75 mcg by mouth daily before breakfast.      lisinopril-hydrochlorothiazide (ZESTORETIC) 20-25 MG tablet TAKE 1/2 TABLET BY MOUTH EVERY DAY 45 tablet 3    Omega-3 Fatty Acids (FISH OIL)  1200 MG CAPS Take 1,200 mg by mouth in the morning and at bedtime.      OVER THE COUNTER MEDICATION Take 2 tablets by mouth daily. Bondi Boost supplement      potassium gluconate 595 (99 K) MG TABS tablet Take 595 mg by mouth daily.      pregabalin (LYRICA) 200 MG capsule Take 200 mg by mouth in the morning and at bedtime.      simvastatin (ZOCOR) 20 MG tablet TAKE 1 TABLET BY MOUTH EVERYDAY AT BEDTIME 90 tablet 0    traZODone (DESYREL) 50  MG tablet TAKE 1/2 TABLET BY MOUTH AS NEEDED AT BEDTIME 45 tablet 1    Allergies  Allergen Reactions   Prednisone Other (See Comments)    Indigestion, dizziness    Social History   Tobacco Use   Smoking status: Former    Packs/day: 1.00    Years: 5.00    Pack years: 5.00    Types: Cigarettes    Quit date: 06/26/1980    Years since quitting: 40.5   Smokeless tobacco: Never  Substance Use Topics   Alcohol use: Yes    Alcohol/week: 1.0 standard drink    Types: 1 Glasses of wine per week    Comment: social    Family History  Problem Relation Age of Onset   Colon cancer Father 52   Colon polyps Father    Esophageal cancer Neg Hx    Rectal cancer Neg Hx    Stomach cancer Neg Hx      Review of Systems  Constitutional: Negative.   HENT: Negative.    Eyes: Negative.   Respiratory: Negative.    Cardiovascular: Negative.   Gastrointestinal: Negative.   Endocrine: Negative.   Genitourinary: Negative.   Musculoskeletal:  Positive for back pain, gait problem and joint swelling.  Skin: Negative.   Allergic/Immunologic: Negative.   Hematological: Negative.   Psychiatric/Behavioral: Negative.     Objective:  Physical Exam Constitutional:      Appearance: Normal appearance. She is normal weight.  HENT:     Head: Normocephalic and atraumatic.     Right Ear: External ear normal.     Left Ear: External ear normal.     Nose: Nose normal.     Mouth/Throat:     Pharynx: Oropharynx is clear.  Eyes:     Conjunctiva/sclera: Conjunctivae normal.  Cardiovascular:     Rate and Rhythm: Normal rate and regular rhythm.     Pulses: Normal pulses.  Pulmonary:     Effort: Pulmonary effort is normal.  Abdominal:     Palpations: Abdomen is soft.  Musculoskeletal:     Cervical back: Neck supple.     Comments:  Examination of her right knee reveals pain medially and laterally.  Mild pain posteriorly.  1+ effusion.  Range of motion -5-120 degrees.  Knee is stable. Examination of her  right hip reveals mild hip pain.  Range of motion is intact.     Skin:    General: Skin is warm and dry.     Capillary Refill: Capillary refill takes less than 2 seconds.  Neurological:     General: No focal deficit present.     Mental Status: She is alert.  Psychiatric:        Mood and Affect: Mood normal.        Behavior: Behavior normal.    Vital signs in last 24 hours: Temp:  [97.7 F (36.5 C)] 97.7 F (36.5 C) (11/02 0848) Pulse  Rate:  [78] 78 (11/02 0848) BP: (116)/(81) 116/81 (11/02 0848) SpO2:  [100 %] 100 % (11/02 0848) Weight:  [89.4 kg] 89.4 kg (11/02 0848)  Labs:   Estimated body mass index is 30.85 kg/m as calculated from the following:   Height as of 01/05/21: 5\' 7"  (1.702 m).   Weight as of 01/05/21: 89.4 kg.   Imaging Review Plain radiographs demonstrate severe degenerative joint disease of the right knee(s). The overall alignment issignificant varus. The bone quality appears to be good for age and reported activity level.      Assessment/Plan:  End stage arthritis, right knee  Principal Problem:   Primary localized osteoarthritis of right knee Active Problems:   GOITER, NONTOXIC MULTINODULAR   Hypercholesterolemia   Hereditary and idiopathic peripheral neuropathy   Essential hypertension   GERD   NEPHROLITHIASIS, HX OF   Chronic pain syndrome   Hypothyroidism   Vitamin D deficiency   The patient history, physical examination, clinical judgment of the provider and imaging studies are consistent with end stage degenerative joint disease of the right knee(s) and total knee arthroplasty is deemed medically necessary. The treatment options including medical management, injection therapy arthroscopy and arthroplasty were discussed at length. The risks and benefits of total knee arthroplasty were presented and reviewed. The risks due to aseptic loosening, infection, stiffness, patella tracking problems, thromboembolic complications and other imponderables  were discussed. The patient acknowledged the explanation, agreed to proceed with the plan and consent was signed. Patient is being admitted for inpatient treatment for surgery, pain control, PT, OT, prophylactic antibiotics, VTE prophylaxis, progressive ambulation and ADL's and discharge planning. The patient is planning to be discharged to home same day.  Physical therapy at our office on 11/16 at 3 pm and postop follow up with Dr Noemi Chapel at 4 pm.  MSSA POSITIVE AND BACTERIA IN URINE   POST OPERATIVE OXYCODONE HAS ALREADY BEEN SENT TO THE PHARMACY ON 01/05/2021.  PATIENT IS ON GABAPENTIN PREOPERATIVELY SO IT DOES NOT NEED TO BE SENT IN

## 2021-01-06 LAB — URINE CULTURE

## 2021-01-06 NOTE — Progress Notes (Signed)
Labs results: PCR: STAPH + UA culture: multiple species present.

## 2021-01-11 NOTE — Care Plan (Signed)
Ortho Bundle Case Management Note  Patient Details  Name: Brandi Spence MRN: [id number] Date of Birth: [birth year]    Met with patient in the office prior to surgery. She will discharge to home with family to assist. Has RW at home CPM ordered for home use.   OPPT set up with [facility]. Patient and MD in agreement with plan. CHoice offered.            DME Arranged:    DME Agency:     HH Arranged:    HH Agency:     Additional Comments: Please contact me with any questions of if this plan should need to change.  Ladell Heads,  [facility]ist  [phone] 01/11/2021, 3:11 PM

## 2021-01-14 ENCOUNTER — Ambulatory Visit (HOSPITAL_COMMUNITY): Payer: Medicare Other | Admitting: Anesthesiology

## 2021-01-15 NOTE — Anesthesia Preprocedure Evaluation (Addendum)
Anesthesia Evaluation  Patient identified by MRN, date of birth, ID band Patient awake    Reviewed: Allergy & Precautions, H&P , NPO status , Patient's Chart, lab work & pertinent test results  Airway Mallampati: II  TM Distance: >3 FB Neck ROM: Full    Dental no notable dental hx. (+) Teeth Intact, Dental Advisory Given   Pulmonary neg pulmonary ROS, former smoker,    Pulmonary exam normal breath sounds clear to auscultation       Cardiovascular Exercise Tolerance: Good hypertension, Pt. on medications negative cardio ROS Normal cardiovascular exam Rhythm:Regular Rate:Normal     Neuro/Psych Chronic pain syndrome Neuropathy of both feet    Neuromuscular disease negative neurological ROS  negative psych ROS   GI/Hepatic negative GI ROS, Neg liver ROS,   Endo/Other  negative endocrine ROSHypothyroidism   Renal/GU negative Renal ROS  negative genitourinary   Musculoskeletal negative musculoskeletal ROS (+) Arthritis , Osteoarthritis,    Abdominal   Peds negative pediatric ROS (+)  Hematology negative hematology ROS (+)   Anesthesia Other Findings   Reproductive/Obstetrics negative OB ROS                            Anesthesia Physical Anesthesia Plan  ASA: 3  Anesthesia Plan: MAC and Spinal   Post-op Pain Management:  Regional for Post-op pain   Induction: Intravenous  PONV Risk Score and Plan: TIVA  Airway Management Planned: Natural Airway, Nasal Cannula and Simple Face Mask  Additional Equipment: None  Intra-op Plan:   Post-operative Plan:   Informed Consent: I have reviewed the patients History and Physical, chart, labs and discussed the procedure including the risks, benefits and alternatives for the proposed anesthesia with the patient or authorized representative who has indicated his/her understanding and acceptance.       Plan Discussed with:  Anesthesiologist  Anesthesia Plan Comments: (Will discuss GA vs Regional day of surgery depending on SCS.  CT SPINE 2012 Dorsal column stimulator lead has been placed spanning the T9 and T10 vertebral bodies. The lead appears intact. There is mild degenerative change in the lower thoracic disc spaces.   During block pt found to be in afib.  Review of chart shows EKG performed September shows afib with no mention by PCP. Surgeon comfortable with holding off until Echocardiogram.   )      Anesthesia Quick Evaluation

## 2021-01-17 ENCOUNTER — Encounter (HOSPITAL_COMMUNITY)
Admission: RE | Disposition: A | Discharge status: Home / Self Care | Payer: Self-pay | Source: Ambulatory Visit | Attending: Orthopedic Surgery

## 2021-01-17 ENCOUNTER — Ambulatory Visit (HOSPITAL_COMMUNITY)
Admission: RE | Admit: 2021-01-17 | Discharge: 2021-01-17 | Disposition: A | Discharge status: Home / Self Care | Payer: Medicare Other | Source: Ambulatory Visit | Attending: Orthopedic Surgery | Admitting: Orthopedic Surgery

## 2021-01-17 ENCOUNTER — Encounter (HOSPITAL_COMMUNITY): Payer: Self-pay | Admitting: Orthopedic Surgery

## 2021-01-17 DIAGNOSIS — R300 Dysuria: Secondary | ICD-10-CM

## 2021-01-17 DIAGNOSIS — E042 Nontoxic multinodular goiter: Secondary | ICD-10-CM | POA: Diagnosis present

## 2021-01-17 DIAGNOSIS — I4891 Unspecified atrial fibrillation: Secondary | ICD-10-CM | POA: Diagnosis not present

## 2021-01-17 DIAGNOSIS — M1711 Unilateral primary osteoarthritis, right knee: Secondary | ICD-10-CM | POA: Diagnosis not present

## 2021-01-17 DIAGNOSIS — K219 Gastro-esophageal reflux disease without esophagitis: Secondary | ICD-10-CM | POA: Diagnosis present

## 2021-01-17 DIAGNOSIS — G8929 Other chronic pain: Secondary | ICD-10-CM

## 2021-01-17 DIAGNOSIS — G894 Chronic pain syndrome: Secondary | ICD-10-CM | POA: Diagnosis present

## 2021-01-17 DIAGNOSIS — Z538 Procedure and treatment not carried out for other reasons: Secondary | ICD-10-CM | POA: Diagnosis not present

## 2021-01-17 DIAGNOSIS — E7800 Pure hypercholesterolemia, unspecified: Secondary | ICD-10-CM | POA: Diagnosis present

## 2021-01-17 DIAGNOSIS — G609 Hereditary and idiopathic neuropathy, unspecified: Secondary | ICD-10-CM | POA: Diagnosis present

## 2021-01-17 DIAGNOSIS — I10 Essential (primary) hypertension: Secondary | ICD-10-CM | POA: Diagnosis present

## 2021-01-17 DIAGNOSIS — Z87442 Personal history of urinary calculi: Secondary | ICD-10-CM | POA: Diagnosis present

## 2021-01-17 DIAGNOSIS — E039 Hypothyroidism, unspecified: Secondary | ICD-10-CM | POA: Diagnosis present

## 2021-01-17 DIAGNOSIS — E559 Vitamin D deficiency, unspecified: Secondary | ICD-10-CM | POA: Diagnosis present

## 2021-01-17 SURGERY — ARTHROPLASTY, KNEE, TOTAL
Anesthesia: Monitor Anesthesia Care | Site: Knee | Laterality: Right

## 2021-01-17 MED ORDER — LACTATED RINGERS IV SOLN: INTRAVENOUS | Status: DC

## 2021-01-17 MED ORDER — BUPIVACAINE LIPOSOME 1.3 % IJ SUSP: 20.0000 mL | Freq: Once | INTRAMUSCULAR | Status: DC

## 2021-01-17 MED ORDER — BUPIVACAINE-EPINEPHRINE (PF) 0.25% -1:200000 IJ SOLN
INTRAMUSCULAR | Status: AC
  Filled 2021-01-17: qty 30

## 2021-01-17 MED ORDER — FENTANYL CITRATE (PF) 250 MCG/5ML IJ SOLN
INTRAMUSCULAR | Status: DC | PRN
  Administered 2021-01-17: 100 ug via INTRAVENOUS

## 2021-01-17 MED ORDER — CEFAZOLIN SODIUM-DEXTROSE 2-4 GM/100ML-% IV SOLN
2.0000 g | INTRAVENOUS | Status: DC
  Filled 2021-01-17: qty 100

## 2021-01-17 MED ORDER — DEXAMETHASONE SODIUM PHOSPHATE 10 MG/ML IJ SOLN: 8.0000 mg | Freq: Once | INTRAMUSCULAR | Status: DC

## 2021-01-17 MED ORDER — BUPIVACAINE LIPOSOME 1.3 % IJ SUSP
INTRAMUSCULAR | Status: AC
  Filled 2021-01-17: qty 20

## 2021-01-17 MED ORDER — CHLORHEXIDINE GLUCONATE 0.12 % MT SOLN
15.0000 mL | Freq: Once | OROMUCOSAL | Status: AC
  Administered 2021-01-17: 15 mL via OROMUCOSAL

## 2021-01-17 MED ORDER — POVIDONE-IODINE 7.5 % EX SOLN: Freq: Once | CUTANEOUS | Status: DC

## 2021-01-17 MED ORDER — POVIDONE-IODINE 10 % EX SWAB
2.0000 "application " | Freq: Once | CUTANEOUS | Status: AC
  Administered 2021-01-17: 2 via TOPICAL

## 2021-01-17 MED ORDER — ORAL CARE MOUTH RINSE: 15.0000 mL | Freq: Once | OROMUCOSAL | Status: AC

## 2021-01-17 MED ORDER — APIXABAN 5 MG PO TABS
5.0000 mg | ORAL_TABLET | Freq: Once | ORAL | Status: AC
  Administered 2021-01-17: 5 mg via ORAL
  Filled 2021-01-17: qty 1

## 2021-01-17 MED ORDER — POVIDONE-IODINE 10 % EX SWAB: 2.0000 "application " | Freq: Once | CUTANEOUS | Status: DC

## 2021-01-17 MED ORDER — TRANEXAMIC ACID-NACL 1000-0.7 MG/100ML-% IV SOLN
1000.0000 mg | INTRAVENOUS | Status: DC
  Filled 2021-01-17: qty 100

## 2021-01-17 MED ORDER — FENTANYL CITRATE (PF) 100 MCG/2ML IJ SOLN
INTRAMUSCULAR | Status: AC
  Filled 2021-01-17: qty 2

## 2021-01-17 MED ORDER — FENTANYL CITRATE (PF) 250 MCG/5ML IJ SOLN
INTRAMUSCULAR | Status: AC
  Filled 2021-01-17: qty 5

## 2021-01-17 NOTE — Progress Notes (Signed)
Per Dr. Ambrose Pancoast , ok to discharge patient when she is back to baseline and is able to ambulate. No further orders at this time.

## 2021-01-17 NOTE — Interval H&P Note (Signed)
History and Physical Interval Note:  01/17/2021 6:39 AM  Brandi Spence  has presented today for surgery, with the diagnosis of right knee DJD.  The various methods of treatment have been discussed with the patient and family. After consideration of risks, benefits and other options for treatment, the patient has consented to  Procedure(s): TOTAL KNEE ARTHROPLASTY (Right) as a surgical intervention.  The patient's history has been reviewed, patient examined, no change in status, stable for surgery.  I have reviewed the patient's chart and labs.  Questions were answered to the patient's satisfaction.     Lorn Junes

## 2021-01-17 NOTE — Progress Notes (Addendum)
Pt at baseline, Pt able to  ambulate with steady gait with little assistance approximately 8 feet;  explained to patient that she has to be careful when walking. Pt states her friend is going to stay with her today and is going to be able to help her out. Maudie Mercury ,RN notified of patient ambulating without difficulty.

## 2021-01-18 ENCOUNTER — Other Ambulatory Visit: Payer: Self-pay

## 2021-01-18 ENCOUNTER — Encounter: Payer: Self-pay | Admitting: Internal Medicine

## 2021-01-18 ENCOUNTER — Ambulatory Visit (INDEPENDENT_AMBULATORY_CARE_PROVIDER_SITE_OTHER): Payer: Medicare Other | Admitting: Internal Medicine

## 2021-01-18 ENCOUNTER — Telehealth: Payer: Self-pay

## 2021-01-18 VITALS — BP 103/64 | HR 73 | Ht 66.0 in | Wt 196.0 lb

## 2021-01-18 DIAGNOSIS — I4891 Unspecified atrial fibrillation: Secondary | ICD-10-CM | POA: Diagnosis not present

## 2021-01-18 MED ORDER — METOPROLOL SUCCINATE ER 25 MG PO TB24: 25.0000 mg | ORAL_TABLET | Freq: Every day | ORAL | 3 refills | Status: DC

## 2021-01-18 NOTE — Progress Notes (Signed)
Cardiology Office Note:    Date:  01/18/2021   ID:  Brandi Spence, Brandi Spence 02-25-1946, MRN [id number]  PCP:  Isaac Bliss, Rayford Halsted, MD   [facility] Providers Cardiologist:  None     Referring MD: Isaac Bliss, Est[place]*   No chief complaint on file. Atrial Fibrillation  History of Present Illness:    Brandi Spence is a 75 y.o. female with a hx of arthritis, hypothyroid, HTN planned for R TKA, found to be in atrial fibrillation rate controlled in the 70s  This is a new diagnosis for her. She went for a pre surgical check up in September. She had atrial fibrillation. She has no cardiac hx , no stress test no LHC.  Her parents had strokes later in life. Her father had a stroke in his 67s. She has siblings. Her sister had breast cancer. No stroke history. Her blood pressure is well controlled. She is retired now. Prior to that she was helping with her mother. She is consistently helping her family. She stays active in the community.   She denies exertional angina, dyspnea on exertion, LH, dizziness, syncopal episode. No orthopena , PND , no LE edema. Low concern for sleep apnea.   TSH 0.7 on levothyroxine. Has a thyroid nodule, stable. Crt 0.78   11/04/2020: Atrial fibrillation rate controlled, low voltage 02/14/2016:sinus bradycardia 59 , low voltage  Past Medical History:  Diagnosis Date   Arthritis    History of kidney stones    Hyperlipidemia    Hypertension    Hypothyroidism    Neuropathy    feet   Pre-diabetes    Primary localized osteoarthritis of right knee 01/05/2021    Past Surgical History:  Procedure Laterality Date   ABDOMINAL HYSTERECTOMY  2005   BREAST ENHANCEMENT SURGERY Bilateral 2000   BUNIONECTOMY Right 2013   CARPAL TUNNEL RELEASE Right 1997   COLONOSCOPY  01/01/2013   EXCISION MORTON'S NEUROMA Bilateral 1980   right foot, 2004   KNEE ARTHROSCOPY Right 2006   2012   PAROTID GLAND TUMOR EXCISION  2009   POLYPECTOMY     SPINAL CORD  STIMULATOR IMPLANT Left 2012   TARSAL TUNNEL RELEASE Right 2005   TONSILLECTOMY      Current Medications: Current Meds  Medication Sig   aspirin 81 MG EC tablet Take 81 mg by mouth daily.     celecoxib (CELEBREX) 200 MG capsule Take 200 mg by mouth daily.   cetirizine (ZYRTEC) 10 MG tablet Take 10 mg by mouth daily.   docusate sodium (COLACE) 100 MG capsule Take 200 mg by mouth at bedtime.   ELIQUIS 5 MG TABS tablet Take 5 mg by mouth 2 (two) times daily.   FIBER PO Take 1 tablet by mouth at bedtime.   HYDROcodone-acetaminophen (NORCO/VICODIN) 5-325 MG tablet Take 1 tablet by mouth at bedtime. Pain management - Dr Hardin Negus   levothyroxine (SYNTHROID, LEVOTHROID) 75 MCG tablet Take 75 mcg by mouth daily before breakfast.   lisinopril-hydrochlorothiazide (ZESTORETIC) 20-25 MG tablet TAKE 1/2 TABLET BY MOUTH EVERY DAY   Omega-3 Fatty Acids (FISH OIL) 1200 MG CAPS Take 1,200 mg by mouth in the morning and at bedtime.   OVER THE COUNTER MEDICATION Take 2 tablets by mouth daily. Bondi Boost supplement   potassium gluconate 595 (99 K) MG TABS tablet Take 595 mg by mouth daily.   pregabalin (LYRICA) 200 MG capsule Take 200 mg by mouth in the morning and at bedtime.   simvastatin (ZOCOR) 20 MG  tablet TAKE 1 TABLET BY MOUTH EVERYDAY AT BEDTIME   traZODone (DESYREL) 50 MG tablet TAKE 1/2 TABLET BY MOUTH AS NEEDED AT BEDTIME     Allergies:   Prednisone   Social History   Socioeconomic History   Marital status: Divorced    Spouse name: Not on file   Number of children: Not on file   Years of education: Not on file   Highest education level: Not on file  Occupational History   Not on file  Tobacco Use   Smoking status: Former    Packs/day: 1.00    Years: 5.00    Pack years: 5.00    Types: Cigarettes    Quit date: 06/26/1980    Years since quitting: 40.5   Smokeless tobacco: Never  Vaping Use   Vaping Use: Never used  Substance and Sexual Activity   Alcohol use: Yes    Alcohol/week:  1.0 standard drink    Types: 1 Glasses of wine per week    Comment: social   Drug use: No   Sexual activity: Not on file  Other Topics Concern   Not on file  Social History Narrative   Not on file   Social Determinants of Health   Financial Resource Strain: Not on file  Food Insecurity: Not on file  Transportation Needs: Not on file  Physical Activity: Not on file  Stress: Not on file  Social Connections: Not on file     Family History: The patient's family history includes Colon cancer (age of onset: 63) in her father; Colon polyps in her father. There is no history of Esophageal cancer, Rectal cancer, or Stomach cancer.  ROS:   Please see the history of present illness.     All other systems reviewed and are negative.  EKGs/Labs/Other Studies Reviewed:    The following studies were reviewed today:   EKG:  EKG is  ordered today.  The ekg ordered today demonstrates  Atrial Fibrillation rate in the 70s; QTc 403 ms  Recent Labs: 08/04/2020: TSH 0.77 01/05/2021: ALT 39; BUN 16; Creatinine, Ser 0.78; Hemoglobin 14.6; Platelets 229; Potassium 3.7; Sodium 138  Recent Lipid Panel    Component Value Date/Time   CHOL 199 06/26/2019 1058   TRIG 223.0 (H) 06/26/2019 1058   HDL 50.60 06/26/2019 1058   CHOLHDL 4 06/26/2019 1058   VLDL 44.6 (H) 06/26/2019 1058   LDLCALC 114 (H) 03/28/2018 1023   LDLDIRECT 112.0 06/26/2019 1058     Risk Assessment/Calculations:    CHA2DS2-VASc Score = 4   This indicates a 4.8% annual risk of stroke. The patient's score is based upon: CHF History: 0 HTN History: 1 Diabetes History: 0 Stroke History: 0 Vascular Disease History: 0 Age Score: 2 Gender Score: 1         Physical Exam:    VS:  Ht 5\' 6"  (1.676 m)   Wt 196 lb (88.9 kg)   BMI 31.64 kg/m     Wt Readings from Last 3 Encounters:  01/18/21 196 lb (88.9 kg)  01/17/21 197 lb (89.4 kg)  01/05/21 197 lb (89.4 kg)     GEN:  Well nourished, well developed in no acute  distress. Looks younger than stated age 41: Normal NECK: No JVD; No carotid bruits LYMPHATICS: No lymphadenopathy CARDIAC: irregular.irregular, no murmurs, rubs, gallops RESPIRATORY:  Clear to auscultation without rales, wheezing or rhonchi  ABDOMEN: Soft, non-tender, non-distended MUSCULOSKELETAL:  No edema; No deformity  SKIN: Warm and dry NEUROLOGIC:  Alert and  oriented x 3 PSYCHIATRIC:  Normal affect   ASSESSMENT:    #New Onset Atrial Fibrillation: Chads2vasc 3. As far as we know she's been in it for two months. She is rate controlled. No hx of ischemic heart disease. No signs of CHF. Will obtain a TTE. She has a thyroid nodule , thyroid function is nl. She has no hx of stroke. Her creatinine is normal, normal liver function tests. She has no lung dx. No hx of sleep apnea. Exam was benign, unlikely has mitral valve dx. Her blood pressures are on the lower end and will plan to stop her antihypertensives and switch to BB. If her echo is normal, she is eligible for any antiarrhythmic.  - continue eliquis 5 mg (will need refills by mail order) - start metoprolol 25 mg XL - stop lisinopril 20-Hctz 25 mg  - referral for atrial fibrillation clinic  #HTN- per above  #HLD- on simvastatin 20 mg daily   PLAN:    In order of problems listed above:  Stop ASA Stop hctz-lisinopril Start metop 25 mg XL TTE Atrial fibrillation clinic; with plan for DCCV (may be 3 weeks by that time and she can go without TEE) Follow up 3 months           Medication Adjustments/Labs and Tests Ordered: Current medicines are reviewed at length with the patient today.  Concerns regarding medicines are outlined above.    Signed, Janina Mayo, MD  01/18/2021 8:06 AM    [place]

## 2021-01-18 NOTE — Patient Instructions (Addendum)
Medication Instructions:  STOP: ASPIRIN  STOP: HCTZ-LISINOPRIL   START: METOPROLOL SUCCINATE 25mg  ONCE DAILY   *If you need a refill on your cardiac medications before your next appointment, please call your pharmacy*  Testing/Procedures: Your physician has requested that you have an echocardiogram. Echocardiography is a painless test that uses sound waves to create images of your heart. It provides your doctor with information about the size and shape of your heart and how well your heart's chambers and valves are working. You may receive an ultrasound enhancing agent through an IV if needed to better visualize your heart during the echo.This procedure takes approximately one hour. There are no restrictions for this procedure. This will take place at the [address], [address].   Follow-Up: At [facility], you and your health needs are our priority.  As part of our continuing mission to provide you with exceptional heart care, we have created designated Provider Care Teams.  These Care Teams include your primary Cardiologist (physician) and Advanced Practice Providers (APPs -  Physician Assistants and Nurse Practitioners) who all work together to provide you with the care you need, when you need it.  Your next appointment:   3 month(s)  The format for your next appointment:   In Person  Provider:   Janina Mayo, MD    Other Instructions PATIENT NEEDS APPOINTMENT WITH A-FIB CLINIC AT NEXT AVAILABLE

## 2021-01-24 ENCOUNTER — Ambulatory Visit (HOSPITAL_COMMUNITY)
Admission: RE | Admit: 2021-01-24 | Discharge: 2021-01-24 | Disposition: A | Discharge status: Home / Self Care | Payer: Medicare Other | Source: Ambulatory Visit | Attending: Physician Assistant | Admitting: Physician Assistant

## 2021-01-24 ENCOUNTER — Encounter (HOSPITAL_COMMUNITY): Payer: Self-pay | Admitting: Physician Assistant

## 2021-01-24 VITALS — BP 138/96 | HR 92 | Ht 66.0 in | Wt 199.6 lb

## 2021-01-24 DIAGNOSIS — E669 Obesity, unspecified: Secondary | ICD-10-CM | POA: Diagnosis not present

## 2021-01-24 DIAGNOSIS — Z7901 Long term (current) use of anticoagulants: Secondary | ICD-10-CM | POA: Diagnosis not present

## 2021-01-24 DIAGNOSIS — Z6832 Body mass index (BMI) 32.0-32.9, adult: Secondary | ICD-10-CM | POA: Diagnosis not present

## 2021-01-24 DIAGNOSIS — I10 Essential (primary) hypertension: Secondary | ICD-10-CM | POA: Insufficient documentation

## 2021-01-24 DIAGNOSIS — Z09 Encounter for follow-up examination after completed treatment for conditions other than malignant neoplasm: Secondary | ICD-10-CM | POA: Insufficient documentation

## 2021-01-24 DIAGNOSIS — I4819 Other persistent atrial fibrillation: Secondary | ICD-10-CM | POA: Diagnosis not present

## 2021-01-24 DIAGNOSIS — D6869 Other thrombophilia: Secondary | ICD-10-CM | POA: Diagnosis not present

## 2021-01-24 DIAGNOSIS — Z87891 Personal history of nicotine dependence: Secondary | ICD-10-CM | POA: Insufficient documentation

## 2021-01-24 DIAGNOSIS — E785 Hyperlipidemia, unspecified: Secondary | ICD-10-CM | POA: Insufficient documentation

## 2021-01-24 DIAGNOSIS — E039 Hypothyroidism, unspecified: Secondary | ICD-10-CM | POA: Insufficient documentation

## 2021-01-24 NOTE — Addendum Note (Signed)
Encounter addended by: Oliver Barre, PA on: 01/24/2021 12:30 PM  Actions taken: Clinical Note Signed

## 2021-01-24 NOTE — H&P (View-Only) (Signed)
Primary Care Physician: Isaac Bliss, Rayford Halsted, MD Primary Cardiologist: Dr Phineas Inches Primary Electrophysiologist: none Referring Physician: Dr Phineas Inches   Brandi Spence is a 75 y.o. female with a history of HTN, hypothyroid, HLD, atrial fibrillation who presents for consultation in the [facility]. The patient was initially diagnosed with atrial fibrillation 11/04/20 at a visit with her PCP although she was not told she had afib at that time. She was unaware of her arrhythmia with no symptoms. She was scheduled for total knee replacement on 01/17/21 but this was cancelled due to patient being in afib. Patient is on Eliquis for a CHADS2VASC score of 4. Seen by Dr Harl Bowie on 01/18/21 and started on BB.   On follow up today, patient reports that she feels well with no symptoms of her afib. She is tolerating the medication without difficulty. Echocardiogram is scheduled for 02/01/21. She is also rescheduled for knee surgery on 02/07/21.  Today, she denies symptoms of palpitations, chest pain, shortness of breath, orthopnea, PND, lower extremity edema, dizziness, presyncope, syncope, snoring, daytime somnolence, bleeding, or neurologic sequela. The patient is tolerating medications without difficulties and is otherwise without complaint today.    Atrial Fibrillation Risk Factors:  she does not have symptoms or diagnosis of sleep apnea. she does not have a history of rheumatic fever. she does not have a history of alcohol use. The patient does not have a history of early familial atrial fibrillation or other arrhythmias.  she has a BMI of Body mass index is 32.22 kg/m.Marland Kitchen Filed Weights   01/24/21 0932  Weight: 90.5 kg    Family History  Problem Relation Age of Onset   Colon cancer Father 50   Colon polyps Father    Esophageal cancer Neg Hx    Rectal cancer Neg Hx    Stomach cancer Neg Hx      Atrial Fibrillation Management history:  Previous  antiarrhythmic drugs: none Previous cardioversions: none Previous ablations: none CHADS2VASC score: 4 Anticoagulation history: Eliquis   Past Medical History:  Diagnosis Date   Arthritis    History of kidney stones    Hyperlipidemia    Hypertension    Hypothyroidism    Neuropathy    feet   Pre-diabetes    Primary localized osteoarthritis of right knee 01/05/2021   Past Surgical History:  Procedure Laterality Date   ABDOMINAL HYSTERECTOMY  2005   BREAST ENHANCEMENT SURGERY Bilateral 2000   BUNIONECTOMY Right 2013   CARPAL TUNNEL RELEASE Right 1997   COLONOSCOPY  01/01/2013   EXCISION MORTON'S NEUROMA Bilateral 1980   right foot, 2004   KNEE ARTHROSCOPY Right 2006   2012   PAROTID GLAND TUMOR EXCISION  2009   POLYPECTOMY     SPINAL CORD STIMULATOR IMPLANT Left 2012   TARSAL TUNNEL RELEASE Right 2005   TONSILLECTOMY      Current Outpatient Medications  Medication Sig Dispense Refill   cetirizine (ZYRTEC) 10 MG tablet Take 10 mg by mouth daily.     docusate sodium (COLACE) 100 MG capsule Take 200 mg by mouth at bedtime.     ELIQUIS 5 MG TABS tablet Take 5 mg by mouth 2 (two) times daily.     FIBER PO Take 1 tablet by mouth at bedtime.     HYDROcodone-acetaminophen (NORCO/VICODIN) 5-325 MG tablet Take 1 tablet by mouth at bedtime. Pain management - Dr Hardin Negus     levothyroxine (SYNTHROID, LEVOTHROID) 75 MCG tablet Take 75 mcg by  mouth daily before breakfast.     metoprolol succinate (TOPROL-XL) 25 MG 24 hr tablet Take 1 tablet (25 mg total) by mouth daily. Take with or immediately following a meal. 90 tablet 3   OVER THE COUNTER MEDICATION Take 2 tablets by mouth daily. Bondi Boost supplement     potassium gluconate 595 (99 K) MG TABS tablet Take 595 mg by mouth daily.     pregabalin (LYRICA) 200 MG capsule Take 200 mg by mouth in the morning and at bedtime.     simvastatin (ZOCOR) 20 MG tablet TAKE 1 TABLET BY MOUTH EVERYDAY AT BEDTIME 90 tablet 0   traZODone (DESYREL)  50 MG tablet TAKE 1/2 TABLET BY MOUTH AS NEEDED AT BEDTIME 45 tablet 1   celecoxib (CELEBREX) 200 MG capsule Take 200 mg by mouth daily.     Omega-3 Fatty Acids (FISH OIL) 1200 MG CAPS Take 1,200 mg by mouth in the morning and at bedtime. (Patient not taking: Reported on 01/24/2021)     No current facility-administered medications for this encounter.   Facility-Administered Medications Ordered in Other Encounters  Medication Dose Route Frequency Provider Last Rate Last Admin   fentaNYL citrate (PF) (SUBLIMAZE) injection   Intravenous Anesthesia Intra-op Eulas Post, April W, CRNA   100 mcg at 01/17/21 0707    Allergies  Allergen Reactions   Prednisone Other (See Comments)    Indigestion, dizziness    Social History   Socioeconomic History   Marital status: Divorced    Spouse name: Not on file   Number of children: Not on file   Years of education: Not on file   Highest education level: Not on file  Occupational History   Not on file  Tobacco Use   Smoking status: Former    Packs/day: 1.00    Years: 5.00    Pack years: 5.00    Types: Cigarettes    Quit date: 06/26/1980    Years since quitting: 40.6   Smokeless tobacco: Never  Vaping Use   Vaping Use: Never used  Substance and Sexual Activity   Alcohol use: Yes    Alcohol/week: 1.0 standard drink    Types: 1 Glasses of wine per week    Comment: social   Drug use: No   Sexual activity: Not on file  Other Topics Concern   Not on file  Social History Narrative   Not on file   Social Determinants of Health   Financial Resource Strain: Not on file  Food Insecurity: Not on file  Transportation Needs: Not on file  Physical Activity: Not on file  Stress: Not on file  Social Connections: Not on file  Intimate Partner Violence: Not on file     ROS- All systems are reviewed and negative except as per the HPI above.  Physical Exam: Vitals:   01/24/21 0932  BP: (!) 138/96  Pulse: 92  Weight: 90.5 kg  Height: 5\' 6"   (1.676 m)    GEN- The patient is a well appearing obese elderly female, alert and oriented x 3 today.   Head- normocephalic, atraumatic Eyes-  Sclera clear, conjunctiva pink Ears- hearing intact Oropharynx- clear Neck- supple  Lungs- Clear to ausculation bilaterally, normal work of breathing Heart- irregular rate and rhythm, no murmurs, rubs or gallops  GI- soft, NT, ND, + BS Extremities- no clubbing, cyanosis, or edema MS- no significant deformity or atrophy Skin- no rash or lesion Psych- euthymic mood, full affect Neuro- strength and sensation are intact  Wt Readings  from Last 3 Encounters:  01/24/21 90.5 kg  01/18/21 88.9 kg  01/17/21 89.4 kg    EKG today demonstrates  Afib Vent. rate 92 BPM PR interval * ms QRS duration 64 ms QT/QTcB 332/410 ms   Epic records are reviewed at length today  CHA2DS2-VASc Score = 4  The patient's score is based upon: CHF History: 0 HTN History: 1 Diabetes History: 0 Stroke History: 0 Vascular Disease History: 0 Age Score: 2 Gender Score: 1       ASSESSMENT AND PLAN: 1. Persistent Atrial Fibrillation (ICD10:  I48.19) The patient's CHA2DS2-VASc score is 4, indicating a 4.8% annual risk of stroke.   We discussed rhythm control options including DCCV. She will need to be on anticoagulation for 3 weeks prior and continue for 4 weeks after with no missed doses. She will need to hold anticoagulation for her knee surgery. She would prefer to have the knee surgery and undergo DCCV after. Given her rate control and paucity of symptoms this would be reasonable, will reach out to Dr Harl Bowie for her input.  Continue Toprol 25 mg daily Continue Eliquis 5 mg BID  2. Secondary Hypercoagulable State (ICD10:  D68.69) The patient is at significant risk for stroke/thromboembolism based upon her CHA2DS2-VASc Score of 4.  Continue Apixaban (Eliquis).   3. Obesity Body mass index is 32.22 kg/m. Lifestyle modification was discussed at length  including regular exercise and weight reduction.  4. HTN Stable, no changes today.   Follow up in the AF clinic in 4-6 weeks.    Addendum: d/w Dr Harl Bowie, will proceed with DCCV first and request postponing knee replacement surgery. She will be eligible for DCCV after 3 weeks of uninterrupted anticoagulation (02/07/21). Will forward note to Dr Elsie Saas with plan. D/w patient and she is in agreement with plan.    [facility] [address] [place], [place] [address] [phone] 01/24/2021 9:42 AM

## 2021-01-24 NOTE — Progress Notes (Addendum)
Primary Care Physician: Isaac Bliss, Rayford Halsted, MD Primary Cardiologist: Dr Phineas Inches Primary Electrophysiologist: none Referring Physician: Dr Phineas Inches   Brandi Spence is a 75 y.o. female with a history of HTN, hypothyroid, HLD, atrial fibrillation who presents for consultation in the [facility]. The patient was initially diagnosed with atrial fibrillation 11/04/20 at a visit with her PCP although she was not told she had afib at that time. She was unaware of her arrhythmia with no symptoms. She was scheduled for total knee replacement on 01/17/21 but this was cancelled due to patient being in afib. Patient is on Eliquis for a CHADS2VASC score of 4. Seen by Dr Harl Bowie on 01/18/21 and started on BB.   On follow up today, patient reports that she feels well with no symptoms of her afib. She is tolerating the medication without difficulty. Echocardiogram is scheduled for 02/01/21. She is also rescheduled for knee surgery on 02/07/21.  Today, she denies symptoms of palpitations, chest pain, shortness of breath, orthopnea, PND, lower extremity edema, dizziness, presyncope, syncope, snoring, daytime somnolence, bleeding, or neurologic sequela. The patient is tolerating medications without difficulties and is otherwise without complaint today.    Atrial Fibrillation Risk Factors:  she does not have symptoms or diagnosis of sleep apnea. she does not have a history of rheumatic fever. she does not have a history of alcohol use. The patient does not have a history of early familial atrial fibrillation or other arrhythmias.  she has a BMI of Body mass index is 32.22 kg/m.Marland Kitchen Filed Weights   01/24/21 0932  Weight: 90.5 kg    Family History  Problem Relation Age of Onset   Colon cancer Father 62   Colon polyps Father    Esophageal cancer Neg Hx    Rectal cancer Neg Hx    Stomach cancer Neg Hx      Atrial Fibrillation Management history:  Previous  antiarrhythmic drugs: none Previous cardioversions: none Previous ablations: none CHADS2VASC score: 4 Anticoagulation history: Eliquis   Past Medical History:  Diagnosis Date   Arthritis    History of kidney stones    Hyperlipidemia    Hypertension    Hypothyroidism    Neuropathy    feet   Pre-diabetes    Primary localized osteoarthritis of right knee 01/05/2021   Past Surgical History:  Procedure Laterality Date   ABDOMINAL HYSTERECTOMY  2005   BREAST ENHANCEMENT SURGERY Bilateral 2000   BUNIONECTOMY Right 2013   CARPAL TUNNEL RELEASE Right 1997   COLONOSCOPY  01/01/2013   EXCISION MORTON'S NEUROMA Bilateral 1980   right foot, 2004   KNEE ARTHROSCOPY Right 2006   2012   PAROTID GLAND TUMOR EXCISION  2009   POLYPECTOMY     SPINAL CORD STIMULATOR IMPLANT Left 2012   TARSAL TUNNEL RELEASE Right 2005   TONSILLECTOMY      Current Outpatient Medications  Medication Sig Dispense Refill   cetirizine (ZYRTEC) 10 MG tablet Take 10 mg by mouth daily.     docusate sodium (COLACE) 100 MG capsule Take 200 mg by mouth at bedtime.     ELIQUIS 5 MG TABS tablet Take 5 mg by mouth 2 (two) times daily.     FIBER PO Take 1 tablet by mouth at bedtime.     HYDROcodone-acetaminophen (NORCO/VICODIN) 5-325 MG tablet Take 1 tablet by mouth at bedtime. Pain management - Dr Hardin Negus     levothyroxine (SYNTHROID, LEVOTHROID) 75 MCG tablet Take 75 mcg by  mouth daily before breakfast.     metoprolol succinate (TOPROL-XL) 25 MG 24 hr tablet Take 1 tablet (25 mg total) by mouth daily. Take with or immediately following a meal. 90 tablet 3   OVER THE COUNTER MEDICATION Take 2 tablets by mouth daily. Bondi Boost supplement     potassium gluconate 595 (99 K) MG TABS tablet Take 595 mg by mouth daily.     pregabalin (LYRICA) 200 MG capsule Take 200 mg by mouth in the morning and at bedtime.     simvastatin (ZOCOR) 20 MG tablet TAKE 1 TABLET BY MOUTH EVERYDAY AT BEDTIME 90 tablet 0   traZODone (DESYREL)  50 MG tablet TAKE 1/2 TABLET BY MOUTH AS NEEDED AT BEDTIME 45 tablet 1   celecoxib (CELEBREX) 200 MG capsule Take 200 mg by mouth daily.     Omega-3 Fatty Acids (FISH OIL) 1200 MG CAPS Take 1,200 mg by mouth in the morning and at bedtime. (Patient not taking: Reported on 01/24/2021)     No current facility-administered medications for this encounter.   Facility-Administered Medications Ordered in Other Encounters  Medication Dose Route Frequency Provider Last Rate Last Admin   fentaNYL citrate (PF) (SUBLIMAZE) injection   Intravenous Anesthesia Intra-op Eulas Post, April W, CRNA   100 mcg at 01/17/21 0707    Allergies  Allergen Reactions   Prednisone Other (See Comments)    Indigestion, dizziness    Social History   Socioeconomic History   Marital status: Divorced    Spouse name: Not on file   Number of children: Not on file   Years of education: Not on file   Highest education level: Not on file  Occupational History   Not on file  Tobacco Use   Smoking status: Former    Packs/day: 1.00    Years: 5.00    Pack years: 5.00    Types: Cigarettes    Quit date: 06/26/1980    Years since quitting: 40.6   Smokeless tobacco: Never  Vaping Use   Vaping Use: Never used  Substance and Sexual Activity   Alcohol use: Yes    Alcohol/week: 1.0 standard drink    Types: 1 Glasses of wine per week    Comment: social   Drug use: No   Sexual activity: Not on file  Other Topics Concern   Not on file  Social History Narrative   Not on file   Social Determinants of Health   Financial Resource Strain: Not on file  Food Insecurity: Not on file  Transportation Needs: Not on file  Physical Activity: Not on file  Stress: Not on file  Social Connections: Not on file  Intimate Partner Violence: Not on file     ROS- All systems are reviewed and negative except as per the HPI above.  Physical Exam: Vitals:   01/24/21 0932  BP: (!) 138/96  Pulse: 92  Weight: 90.5 kg  Height: 5\' 6"   (1.676 m)    GEN- The patient is a well appearing obese elderly female, alert and oriented x 3 today.   Head- normocephalic, atraumatic Eyes-  Sclera clear, conjunctiva pink Ears- hearing intact Oropharynx- clear Neck- supple  Lungs- Clear to ausculation bilaterally, normal work of breathing Heart- irregular rate and rhythm, no murmurs, rubs or gallops  GI- soft, NT, ND, + BS Extremities- no clubbing, cyanosis, or edema MS- no significant deformity or atrophy Skin- no rash or lesion Psych- euthymic mood, full affect Neuro- strength and sensation are intact  Wt Readings  from Last 3 Encounters:  01/24/21 90.5 kg  01/18/21 88.9 kg  01/17/21 89.4 kg    EKG today demonstrates  Afib Vent. rate 92 BPM PR interval * ms QRS duration 64 ms QT/QTcB 332/410 ms   Epic records are reviewed at length today  CHA2DS2-VASc Score = 4  The patient's score is based upon: CHF History: 0 HTN History: 1 Diabetes History: 0 Stroke History: 0 Vascular Disease History: 0 Age Score: 2 Gender Score: 1       ASSESSMENT AND PLAN: 1. Persistent Atrial Fibrillation (ICD10:  I48.19) The patient's CHA2DS2-VASc score is 4, indicating a 4.8% annual risk of stroke.   We discussed rhythm control options including DCCV. She will need to be on anticoagulation for 3 weeks prior and continue for 4 weeks after with no missed doses. She will need to hold anticoagulation for her knee surgery. She would prefer to have the knee surgery and undergo DCCV after. Given her rate control and paucity of symptoms this would be reasonable, will reach out to Dr Harl Bowie for her input.  Continue Toprol 25 mg daily Continue Eliquis 5 mg BID  2. Secondary Hypercoagulable State (ICD10:  D68.69) The patient is at significant risk for stroke/thromboembolism based upon her CHA2DS2-VASc Score of 4.  Continue Apixaban (Eliquis).   3. Obesity Body mass index is 32.22 kg/m. Lifestyle modification was discussed at length  including regular exercise and weight reduction.  4. HTN Stable, no changes today.   Follow up in the AF clinic in 4-6 weeks.    Addendum: d/w Dr Harl Bowie, will proceed with DCCV first and request postponing knee replacement surgery. She will be eligible for DCCV after 3 weeks of uninterrupted anticoagulation (02/07/21). Will forward note to Dr Elsie Saas with plan. D/w patient and she is in agreement with plan.    [facility] [address] [place],  [address] [phone] 01/24/2021 9:42 AM

## 2021-01-24 NOTE — Progress Notes (Signed)
Sent message, via epic in basket, requesting orders in epic from surgeon.  

## 2021-01-26 ENCOUNTER — Telehealth: Payer: Self-pay | Admitting: Internal Medicine

## 2021-01-26 ENCOUNTER — Other Ambulatory Visit: Payer: Self-pay | Admitting: Internal Medicine

## 2021-01-26 ENCOUNTER — Encounter (HOSPITAL_COMMUNITY): Payer: Self-pay | Admitting: Cardiology

## 2021-01-26 ENCOUNTER — Other Ambulatory Visit: Payer: Self-pay

## 2021-01-26 DIAGNOSIS — I4819 Other persistent atrial fibrillation: Secondary | ICD-10-CM

## 2021-01-26 DIAGNOSIS — Z01812 Encounter for preprocedural laboratory examination: Secondary | ICD-10-CM

## 2021-01-26 DIAGNOSIS — I4891 Unspecified atrial fibrillation: Secondary | ICD-10-CM

## 2021-01-26 NOTE — Telephone Encounter (Signed)
Patient had appt at the A-fib clinic on Monday.  The PA from there called her saying that we would be calling her to set up an appt were we shock her heart. She still hasn't heard from [place].

## 2021-01-26 NOTE — Telephone Encounter (Signed)
Spoke with patient of Dr. Harl Bowie who saw R. Fenton PA on 11/21 in AFIB clinic. She would like to proceed. She can have procedure after 3 weeks of uninterrupted anticoagulation (Eliquis) which is after 02/07/21  Scheduled for 02/08/21 @ 9:30 with Levada Dy MD at [facility] Case # [phone]   Spoke with patient and relayed cardioversion instructions - also sent to [place]. She was advised to call Dr. Archie Endo office about postponing her surgery. She has a f/u with R. Fenton PA on 12/28  Message sent to pre-cert   Message sent to MD and primary nurse regarding hospital procedure orders

## 2021-02-01 ENCOUNTER — Telehealth: Payer: Self-pay | Admitting: Internal Medicine

## 2021-02-01 ENCOUNTER — Ambulatory Visit (INDEPENDENT_AMBULATORY_CARE_PROVIDER_SITE_OTHER): Payer: Medicare Other

## 2021-02-01 ENCOUNTER — Other Ambulatory Visit: Payer: Self-pay

## 2021-02-01 DIAGNOSIS — I4891 Unspecified atrial fibrillation: Secondary | ICD-10-CM

## 2021-02-01 LAB — ECHOCARDIOGRAM COMPLETE
AR max vel: 2.18 cm2
AV Area VTI: 1.97 cm2
AV Area mean vel: 2.26 cm2
AV Mean grad: 2 mmHg
AV Peak grad: 3.7 mmHg
Ao pk vel: 0.97 m/s
Area-P 1/2: 3.76 cm2
MV M vel: 5.32 m/s
MV Peak grad: 113.2 mmHg

## 2021-02-01 NOTE — Telephone Encounter (Signed)
  *  STAT* If patient is at the pharmacy, call can be transferred to refill team.   1. Which medications need to be refilled? (please list name of each medication and dose if known) ELIQUIS 5 MG TABS tablet  2. Which pharmacy/location (including street and city if local pharmacy) is medication to be sent to? [facility], phone# [phone]  3. Do they need a 30 day or 90 day supply? 90 days

## 2021-02-02 ENCOUNTER — Telehealth: Payer: Self-pay | Admitting: Internal Medicine

## 2021-02-02 MED ORDER — ELIQUIS 5 MG PO TABS: 5.0000 mg | ORAL_TABLET | Freq: Two times a day (BID) | ORAL | 1 refills | Status: DC

## 2021-02-02 NOTE — Telephone Encounter (Signed)
Brandi Mayo, MD  02/01/2021  3:01 PM EST     Hi [name], please let [name] know that her echo did not show evidence of concerning heart disease. She can proceed with her surgery after her cardioversion 12/6. However, will need to be on blood thinners 4 weeks afterwards without interruption, so will have to wait until then , after the new year. Thank you !   Patient aware and verbalized understanding.  Sent to surgeon to make aware.

## 2021-02-02 NOTE — Telephone Encounter (Signed)
74 F 88.9 kg, SCr 0.78, LOV M [place] 11/22

## 2021-02-02 NOTE — Telephone Encounter (Signed)
Patient was returning a call to discuss her echo results

## 2021-02-03 ENCOUNTER — Encounter (HOSPITAL_COMMUNITY): Payer: Medicare Other

## 2021-02-03 ENCOUNTER — Encounter (HOSPITAL_COMMUNITY): Payer: Self-pay

## 2021-02-03 ENCOUNTER — Other Ambulatory Visit: Payer: Self-pay | Admitting: Internal Medicine

## 2021-02-03 DIAGNOSIS — E7800 Pure hypercholesterolemia, unspecified: Secondary | ICD-10-CM

## 2021-02-03 LAB — BASIC METABOLIC PANEL
BUN/Creatinine Ratio: 19 (ref 12–28)
BUN: 15 mg/dL (ref 8–27)
CO2: 24 mmol/L (ref 20–29)
Calcium: 9.2 mg/dL (ref 8.7–10.3)
Chloride: 103 mmol/L (ref 96–106)
Creatinine, Ser: 0.79 mg/dL (ref 0.57–1.00)
Glucose: 95 mg/dL (ref 70–99)
Potassium: 4.5 mmol/L (ref 3.5–5.2)
Sodium: 142 mmol/L (ref 134–144)
eGFR: 78 mL/min/{1.73_m2} (ref >59)

## 2021-02-03 LAB — CBC
Hematocrit: 38.7 % (ref 34.0–46.6)
Hemoglobin: 13.5 g/dL (ref 11.1–15.9)
MCH: 31.5 pg (ref 26.6–33.0)
MCHC: 34.9 g/dL (ref 31.5–35.7)
MCV: 90 fL (ref 79–97)
Platelets: 236 10*3/uL (ref 150–450)
RBC: 4.29 x10E6/uL (ref 3.77–5.28)
RDW: 13.4 % (ref 11.7–15.4)
WBC: 5.9 10*3/uL (ref 3.4–10.8)

## 2021-02-05 DIAGNOSIS — U071 COVID-19: Secondary | ICD-10-CM | POA: Diagnosis not present

## 2021-02-07 ENCOUNTER — Encounter (HOSPITAL_COMMUNITY): Admission: RE | Payer: Self-pay | Source: Home / Self Care

## 2021-02-07 ENCOUNTER — Ambulatory Visit (HOSPITAL_COMMUNITY)
Admission: RE | Admit: 2021-02-07 | Payer: Federal, State, Local not specified - PPO | Source: Home / Self Care | Admitting: Orthopedic Surgery

## 2021-02-07 SURGERY — ARTHROPLASTY, KNEE, TOTAL
Anesthesia: Choice | Site: Knee | Laterality: Right

## 2021-02-08 ENCOUNTER — Ambulatory Visit (HOSPITAL_COMMUNITY): Payer: Medicare Other | Admitting: Certified Registered"

## 2021-02-08 ENCOUNTER — Other Ambulatory Visit: Payer: Self-pay

## 2021-02-08 ENCOUNTER — Encounter (HOSPITAL_COMMUNITY): Payer: Self-pay | Admitting: Cardiology

## 2021-02-08 ENCOUNTER — Encounter (HOSPITAL_COMMUNITY)
Admission: RE | Disposition: A | Discharge status: Home / Self Care | Payer: Self-pay | Source: Home / Self Care | Attending: Cardiology

## 2021-02-08 ENCOUNTER — Ambulatory Visit (HOSPITAL_COMMUNITY)
Admission: RE | Admit: 2021-02-08 | Discharge: 2021-02-08 | Disposition: A | Discharge status: Home / Self Care | Payer: Medicare Other | Attending: Cardiology | Admitting: Cardiology

## 2021-02-08 DIAGNOSIS — E669 Obesity, unspecified: Secondary | ICD-10-CM | POA: Insufficient documentation

## 2021-02-08 DIAGNOSIS — I4819 Other persistent atrial fibrillation: Secondary | ICD-10-CM | POA: Diagnosis not present

## 2021-02-08 DIAGNOSIS — I10 Essential (primary) hypertension: Secondary | ICD-10-CM | POA: Insufficient documentation

## 2021-02-08 DIAGNOSIS — E7800 Pure hypercholesterolemia, unspecified: Secondary | ICD-10-CM | POA: Diagnosis not present

## 2021-02-08 DIAGNOSIS — Z7901 Long term (current) use of anticoagulants: Secondary | ICD-10-CM | POA: Insufficient documentation

## 2021-02-08 DIAGNOSIS — Z6832 Body mass index (BMI) 32.0-32.9, adult: Secondary | ICD-10-CM | POA: Diagnosis not present

## 2021-02-08 DIAGNOSIS — D6869 Other thrombophilia: Secondary | ICD-10-CM | POA: Insufficient documentation

## 2021-02-08 DIAGNOSIS — E559 Vitamin D deficiency, unspecified: Secondary | ICD-10-CM | POA: Diagnosis not present

## 2021-02-08 HISTORY — PX: CARDIOVERSION: SHX1299

## 2021-02-08 SURGERY — CARDIOVERSION
Anesthesia: General

## 2021-02-08 MED ORDER — METOPROLOL SUCCINATE ER 25 MG PO TB24: 12.5000 mg | ORAL_TABLET | Freq: Every day | ORAL | 3 refills | Status: DC

## 2021-02-08 MED ORDER — SODIUM CHLORIDE 0.9 % IV SOLN: INTRAVENOUS | Status: DC

## 2021-02-08 MED ORDER — PROPOFOL 10 MG/ML IV BOLUS
INTRAVENOUS | Status: DC | PRN
  Administered 2021-02-08: 50 mg via INTRAVENOUS

## 2021-02-08 MED ORDER — LIDOCAINE 2% (20 MG/ML) 5 ML SYRINGE
INTRAMUSCULAR | Status: DC | PRN
  Administered 2021-02-08: 60 mg via INTRAVENOUS

## 2021-02-08 MED ORDER — APIXABAN 5 MG PO TABS
5.0000 mg | ORAL_TABLET | ORAL | Status: AC
  Administered 2021-02-08: 5 mg via ORAL
  Filled 2021-02-08: qty 1

## 2021-02-08 NOTE — Transfer of Care (Signed)
Immediate Anesthesia Transfer of Care Note  Patient: Brandi Spence  Procedure(s) Performed: CARDIOVERSION  Patient Location: Endoscopy Unit  Anesthesia Type:General  Level of Consciousness: sedated, patient cooperative and responds to stimulation  Airway & Oxygen Therapy: Patient Spontanous Breathing and Patient connected to nasal cannula oxygen  Post-op Assessment: Report given to RN and Post -op Vital signs reviewed and stable  Post vital signs: Reviewed and stable  Last Vitals:  Vitals Value Taken Time  BP    Temp    Pulse    Resp    SpO2      Last Pain:  Vitals:   02/08/21 0842  TempSrc: Temporal  PainSc: 0-No pain         Complications: No notable events documented.

## 2021-02-08 NOTE — Discharge Instructions (Signed)

## 2021-02-08 NOTE — Interval H&P Note (Signed)
History and Physical Interval Note:  02/08/2021 9:50 AM  Brandi Spence  has presented today for surgery, with the diagnosis of AFIB.  The various methods of treatment have been discussed with the patient and family. After consideration of risks, benefits and other options for treatment, the patient has consented to  Procedure(s): CARDIOVERSION (N/A) as a surgical intervention.  The patient's history has been reviewed, patient examined, no change in status, stable for surgery.  I have reviewed the patient's chart and labs.  Questions were answered to the patient's satisfaction.     Donato Heinz

## 2021-02-08 NOTE — Anesthesia Preprocedure Evaluation (Signed)
Anesthesia Evaluation  Patient identified by MRN, date of birth, ID band Patient awake    Reviewed: Allergy & Precautions, NPO status , Patient's Chart, lab work & pertinent test results, reviewed documented beta blocker date and time   Airway Mallampati: II  TM Distance: <3 FB Neck ROM: Full    Dental  (+) Teeth Intact   Pulmonary former smoker,    breath sounds clear to auscultation       Cardiovascular hypertension, Pt. on home beta blockers  Rhythm:Irregular Rate:Abnormal     Neuro/Psych  Neuromuscular disease    GI/Hepatic Neg liver ROS, GERD  ,  Endo/Other  Hypothyroidism Obesity    Renal/GU negative Renal ROS     Musculoskeletal  (+) Arthritis ,   Abdominal   Peds  Hematology  (+) Blood dyscrasia (Eliquis), ,   Anesthesia Other Findings   Reproductive/Obstetrics                             Anesthesia Physical Anesthesia Plan  ASA: 3  Anesthesia Plan: General   Post-op Pain Management:    Induction: Intravenous  PONV Risk Score and Plan: 3 and Propofol infusion and Treatment may vary due to age or medical condition  Airway Management Planned: Natural Airway and Mask  Additional Equipment:   Intra-op Plan:   Post-operative Plan:   Informed Consent: I have reviewed the patients History and Physical, chart, labs and discussed the procedure including the risks, benefits and alternatives for the proposed anesthesia with the patient or authorized representative who has indicated his/her understanding and acceptance.     Dental advisory given  Plan Discussed with: CRNA  Anesthesia Plan Comments:         Anesthesia Quick Evaluation

## 2021-02-08 NOTE — Anesthesia Postprocedure Evaluation (Signed)
Anesthesia Post Note  Patient: Cheral Marker  Procedure(s) Performed: CARDIOVERSION     Patient location during evaluation: Endoscopy Anesthesia Type: General Level of consciousness: awake and patient cooperative Pain management: pain level controlled Vital Signs Assessment: post-procedure vital signs reviewed and stable Respiratory status: spontaneous breathing, nonlabored ventilation, respiratory function stable and patient connected to nasal cannula oxygen Cardiovascular status: blood pressure returned to baseline and stable Postop Assessment: no apparent nausea or vomiting Anesthetic complications: no   No notable events documented.  Last Vitals:  Vitals:   02/08/21 1000 02/08/21 1011  BP: 115/79 119/71  Pulse: (!) 47 62  Resp: (!) 25 12  Temp: (!) 36.4 C   SpO2: 100% 100%    Last Pain:  Vitals:   02/08/21 1011  TempSrc:   PainSc: 0-No pain                 Tristin Gladman

## 2021-02-08 NOTE — CV Procedure (Addendum)
Procedure:   DCCV  Indication:  Symptomatic atrial fibrillation  Procedure Note:  The patient signed informed consent.  They have had had therapeutic anticoagulation with Eliquis greater than 3 weeks.  Anesthesia was administered by Dr. Gifford Shave and Barrett Henle, CRNA.  Adequate airway was maintained throughout and vital followed per protocol.  They were cardioverted x 1 with 200J of biphasic synchronized energy.  They converted to NSR.  Rates were in high 40s to low 50s following cardioversion, will reduce toprol XL dose to 12.5 mg daily  There were no apparent complications.  The patient had normal neuro status and respiratory status post procedure with vitals stable as recorded elsewhere.    Follow up:  They will continue on current medical therapy and follow up with cardiology as scheduled.  Oswaldo Milian, MD 02/08/2021 9:58 AM

## 2021-02-09 ENCOUNTER — Telehealth: Payer: Self-pay

## 2021-02-09 ENCOUNTER — Encounter (HOSPITAL_COMMUNITY): Payer: Self-pay | Admitting: Cardiology

## 2021-02-09 NOTE — Telephone Encounter (Signed)
Returned call to patient, made patient aware of Dr. Nelly Laurence recommendations. Patient states that she was on Celebrex and was stopped when she was going to have knee surgery but since her knee surgery has been pushed out she may try and go back on it. Patient would like to know if there are any contraindications to being on celebrex with her current issues and cardiac medications. Patient would also like to know if she should continue on the Metoprolol Succinate- was decreased yesterday following cardioversion due to conversion NSR with rates high 40's-50's. Advised patient I would forward message to PharmD and Dr. Harl Bowie for review and advice. Patient verbalized understanding.

## 2021-02-09 NOTE — Telephone Encounter (Signed)
Celebrex can increase risk of bleeding and pt already takes Eliquis for her afib. Would recommend she use lowest effective dose of Celebrex for short duration, and stop taking it if she notices an increase in bruising or bleeding.  Will defer to MD regarding beta blocker therapy.

## 2021-02-09 NOTE — Telephone Encounter (Signed)
-----   Message from Janina Mayo, MD sent at 02/08/2021  5:59 PM EST ----- Regarding: Knee Surgery Hi Shary Lamos,  Please let Mrs Tondreau know that she went into normal rhythm after cardioversion. She'll have to take her eliquis for 4 weeks uninterrupted. She can have her knee surgery done after that, so would be after the New year.  Thank you ! ----- Message ----- From: Donato Heinz, MD Sent: 02/08/2021   2:39 PM EST To: Oliver Barre, PA, Janina Mayo, MD

## 2021-02-11 MED ORDER — AMLODIPINE BESYLATE 5 MG PO TABS: 5.0000 mg | ORAL_TABLET | Freq: Every day | ORAL | 3 refills | Status: DC

## 2021-02-11 NOTE — Telephone Encounter (Signed)
Returned call to patient, made patient aware of Dr. Nelly Laurence recommendations as well as PharmD's recommendations. Metoprolol has been removed from patients medication list and Amlodipine (Norvasc) 5 mg Daily sent to patient preferred pharmacy.     Janina Mayo, MD  You; Cv Div Pharmd 2 days ago   MB HI [name], please let Mrs Guidotti know we will stop her metoprolol. Please start norvasc 5 mg daily. Please let her know that she should take the lowest dose of celebrex. Aim to not go over 200 mg.    Advised patient to call back to office with any issues, questions, or concerns. Patient verbalized understanding.

## 2021-02-11 NOTE — Addendum Note (Signed)
Addended by: Rexanne Mano B on: 02/11/2021 01:38 PM   Modules accepted: Orders

## 2021-02-12 ENCOUNTER — Encounter: Payer: Self-pay | Admitting: Internal Medicine

## 2021-02-22 DIAGNOSIS — M1711 Unilateral primary osteoarthritis, right knee: Secondary | ICD-10-CM | POA: Diagnosis not present

## 2021-03-01 NOTE — H&P (View-Only) (Signed)
Primary Care Physician: Isaac Bliss, Rayford Halsted, MD Primary Cardiologist: Dr Phineas Inches Primary Electrophysiologist: none Referring Physician: Dr Phineas Inches   Brandi Spence is a 75 y.o. female with a history of HTN, hypothyroid, HLD, atrial fibrillation who presents for consultation in the [facility]. The patient was initially diagnosed with atrial fibrillation 11/04/20 at a visit with her PCP although she was not told she had afib at that time. She was unaware of her arrhythmia with no symptoms. She was scheduled for total knee replacement on 01/17/21 but this was cancelled due to patient being in afib. Patient is on Eliquis for a CHADS2VASC score of 4. Seen by Dr Harl Bowie on 01/18/21 and started on BB.   On follow up today, patient is s/p DCCV on 02/08/21. Unfortunately, she is back in afib. She purchased a Kardia mobile device on 02/11/21 which showed afib at that time. She has had one reading of SR. She remains asymptomatic.   Today, she denies symptoms of palpitations, chest pain, shortness of breath, orthopnea, PND, lower extremity edema, dizziness, presyncope, syncope, snoring, daytime somnolence, bleeding, or neurologic sequela. The patient is tolerating medications without difficulties and is otherwise without complaint today.    Atrial Fibrillation Risk Factors:  she does not have symptoms or diagnosis of sleep apnea. she does not have a history of rheumatic fever. she does not have a history of alcohol use. The patient does not have a history of early familial atrial fibrillation or other arrhythmias.  she has a BMI of Body mass index is 32.05 kg/m.Marland Kitchen Filed Weights   03/02/21 1037  Weight: 90.1 kg     Family History  Problem Relation Age of Onset   Colon cancer Father 68   Colon polyps Father    Esophageal cancer Neg Hx    Rectal cancer Neg Hx    Stomach cancer Neg Hx      Atrial Fibrillation Management history:  Previous  antiarrhythmic drugs: none Previous cardioversions: 02/08/21 Previous ablations: none CHADS2VASC score: 4 Anticoagulation history: Eliquis   Past Medical History:  Diagnosis Date   Arthritis    History of kidney stones    Hyperlipidemia    Hypertension    Hypothyroidism    Neuropathy    feet   Pre-diabetes    Primary localized osteoarthritis of right knee 01/05/2021   Past Surgical History:  Procedure Laterality Date   ABDOMINAL HYSTERECTOMY  2005   BREAST ENHANCEMENT SURGERY Bilateral 2000   BUNIONECTOMY Right 2013   CARDIOVERSION N/A 02/08/2021   Procedure: CARDIOVERSION;  Surgeon: Donato Heinz, MD;  Location: [facility];  Service: Cardiovascular;  Laterality: N/A;   CARPAL TUNNEL RELEASE Right 1997   COLONOSCOPY  01/01/2013   EXCISION MORTON'S NEUROMA Bilateral 1980   right foot, 2004   KNEE ARTHROSCOPY Right 2006   2012   PAROTID GLAND TUMOR EXCISION  2009   POLYPECTOMY     SPINAL CORD STIMULATOR IMPLANT Left 2012   TARSAL TUNNEL RELEASE Right 2005   TONSILLECTOMY      Current Outpatient Medications  Medication Sig Dispense Refill   cetirizine (ZYRTEC) 10 MG tablet Take 10 mg by mouth daily.     docusate sodium (COLACE) 100 MG capsule Take 200 mg by mouth at bedtime.     ELIQUIS 5 MG TABS tablet Take 1 tablet (5 mg total) by mouth 2 (two) times daily. 180 tablet 1   FIBER PO Take 1 tablet by mouth at bedtime.  flecainide (TAMBOCOR) 50 MG tablet Take 1 tablet (50 mg total) by mouth 2 (two) times daily. 60 tablet 3   HYDROcodone-acetaminophen (NORCO/VICODIN) 5-325 MG tablet Take 1 tablet by mouth at bedtime. Pain management - Dr Hardin Negus     levothyroxine (SYNTHROID, LEVOTHROID) 75 MCG tablet Take 75 mcg by mouth daily before breakfast.     metoprolol succinate (TOPROL XL) 25 MG 24 hr tablet Take 0.5 tablets (12.5 mg total) by mouth daily. 30 tablet 11   OVER THE COUNTER MEDICATION Take 2 tablets by mouth daily. Bondi Boost supplement     potassium  gluconate 595 (99 K) MG TABS tablet Take 595 mg by mouth daily.     pregabalin (LYRICA) 200 MG capsule Take 200 mg by mouth in the morning and at bedtime.     simvastatin (ZOCOR) 20 MG tablet TAKE 1 TABLET BY MOUTH EVERYDAY AT BEDTIME 90 tablet 0   traZODone (DESYREL) 50 MG tablet TAKE 1/2 TABLET BY MOUTH AS NEEDED AT BEDTIME 45 tablet 1   No current facility-administered medications for this encounter.   Facility-Administered Medications Ordered in Other Encounters  Medication Dose Route Frequency Provider Last Rate Last Admin   fentaNYL citrate (PF) (SUBLIMAZE) injection   Intravenous Anesthesia Intra-op Eulas Post, April W, CRNA   100 mcg at 01/17/21 0707    Allergies  Allergen Reactions   Prednisone Other (See Comments)    Indigestion, dizziness    Social History   Socioeconomic History   Marital status: Divorced    Spouse name: Not on file   Number of children: Not on file   Years of education: Not on file   Highest education level: Not on file  Occupational History   Not on file  Tobacco Use   Smoking status: Former    Packs/day: 1.00    Years: 5.00    Pack years: 5.00    Types: Cigarettes    Quit date: 06/26/1980    Years since quitting: 40.7   Smokeless tobacco: Never   Tobacco comments:    Former smoker 03/02/21  Vaping Use   Vaping Use: Never used  Substance and Sexual Activity   Alcohol use: Yes    Alcohol/week: 1.0 standard drink    Types: 1 Standard drinks or equivalent per week    Comment: once a month 03/02/21   Drug use: No   Sexual activity: Not on file  Other Topics Concern   Not on file  Social History Narrative   Not on file   Social Determinants of Health   Financial Resource Strain: Not on file  Food Insecurity: Not on file  Transportation Needs: Not on file  Physical Activity: Not on file  Stress: Not on file  Social Connections: Not on file  Intimate Partner Violence: Not on file     ROS- All systems are reviewed and negative except  as per the HPI above.  Physical Exam: Vitals:   03/02/21 1037  BP: 124/82  Pulse: (!) 102  Weight: 90.1 kg  Height: 5\' 6"  (1.676 m)    GEN- The patient is a well appearing elderly obese female, alert and oriented x 3 today.   HEENT-head normocephalic, atraumatic, sclera clear, conjunctiva pink, hearing intact, trachea midline. Lungs- Clear to ausculation bilaterally, normal work of breathing Heart- irregular rate and rhythm, no murmurs, rubs or gallops  GI- soft, NT, ND, + BS Extremities- no clubbing, cyanosis, or edema MS- no significant deformity or atrophy Skin- no rash or lesion Psych- euthymic mood,  full affect Neuro- strength and sensation are intact   Wt Readings from Last 3 Encounters:  03/02/21 90.1 kg  02/08/21 88.5 kg  01/24/21 90.5 kg    EKG today demonstrates  Afib Vent. rate 102 BPM PR interval * ms QRS duration 66 ms QT/QTcB 322/419 ms  Echo 02/01/21 demonstrated  1. Left ventricular ejection fraction, by estimation, is 55 to 60%. The  left ventricle has normal function. The left ventricle has no regional  wall motion abnormalities. Left ventricular diastolic function could not  be evaluated.   2. Right ventricular systolic function is low normal. The right  ventricular size is normal. There is moderately elevated pulmonary artery systolic pressure. The estimated right ventricular systolic pressure is 41.9 mmHg.   3. Left atrial size was moderately dilated.   4. The mitral valve is grossly normal. Mild to moderate mitral valve  regurgitation.   5. The aortic valve is tricuspid. Aortic valve regurgitation is not  visualized.   6. The inferior vena cava is dilated in size with <50% respiratory  variability, suggesting right atrial pressure of 15 mmHg.   Comparison(s): No prior Echocardiogram.   Epic records are reviewed at length today  CHA2DS2-VASc Score = 4  The patient's score is based upon: CHF History: 0 HTN History: 1 Diabetes History:  0 Stroke History: 0 Vascular Disease History: 0 Age Score: 2 Gender Score: 1        ASSESSMENT AND PLAN: 1. Persistent Atrial Fibrillation (ICD10:  I48.19) The patient's CHA2DS2-VASc score is 4, indicating a 4.8% annual risk of stroke.   S/p DCCV on 02/08/21 with early return of afib. We discussed rhythm control options today including AAD and ablation.  Will start flecainide 50 mg BID. No history of CAD. Resume Toprol 12.5 mg daily If she does not convert chemically, will plan to titrate flecainide and repeat DCCV.  Her knee surgery will need to be delayed until she can safely stop anticoagulation.  Continue Eliquis 5 mg BID Kardia for home monitoring.   2. Secondary Hypercoagulable State (ICD10:  D68.69) The patient is at significant risk for stroke/thromboembolism based upon her CHA2DS2-VASc Score of 4.  Continue Apixaban (Eliquis).   3. Obesity Body mass index is 32.05 kg/m. Lifestyle modification was discussed and encouraged including regular physical activity and weight reduction.  4. HTN Stable, start BB as above and stop amlodipine.    Follow up in the AF clinic next week for ECG.    [facility] [address] [place], [place] [address] [phone] 03/02/2021 1:27 PM

## 2021-03-01 NOTE — Progress Notes (Signed)
Primary Care Physician: Isaac Bliss, Rayford Halsted, MD Primary Cardiologist: Dr Phineas Inches Primary Electrophysiologist: none Referring Physician: Dr Phineas Inches   Brandi Spence is a 75 y.o. female with a history of HTN, hypothyroid, HLD, atrial fibrillation who presents for consultation in the [facility]. The patient was initially diagnosed with atrial fibrillation 11/04/20 at a visit with her PCP although she was not told she had afib at that time. She was unaware of her arrhythmia with no symptoms. She was scheduled for total knee replacement on 01/17/21 but this was cancelled due to patient being in afib. Patient is on Eliquis for a CHADS2VASC score of 4. Seen by Dr Harl Bowie on 01/18/21 and started on BB.   On follow up today, patient is s/p DCCV on 02/08/21. Unfortunately, she is back in afib. She purchased a Kardia mobile device on 02/11/21 which showed afib at that time. She has had one reading of SR. She remains asymptomatic.   Today, she denies symptoms of palpitations, chest pain, shortness of breath, orthopnea, PND, lower extremity edema, dizziness, presyncope, syncope, snoring, daytime somnolence, bleeding, or neurologic sequela. The patient is tolerating medications without difficulties and is otherwise without complaint today.    Atrial Fibrillation Risk Factors:  she does not have symptoms or diagnosis of sleep apnea. she does not have a history of rheumatic fever. she does not have a history of alcohol use. The patient does not have a history of early familial atrial fibrillation or other arrhythmias.  she has a BMI of Body mass index is 32.05 kg/m.Marland Kitchen Filed Weights   03/02/21 1037  Weight: 90.1 kg     Family History  Problem Relation Age of Onset   Colon cancer Father 17   Colon polyps Father    Esophageal cancer Neg Hx    Rectal cancer Neg Hx    Stomach cancer Neg Hx      Atrial Fibrillation Management history:  Previous  antiarrhythmic drugs: none Previous cardioversions: 02/08/21 Previous ablations: none CHADS2VASC score: 4 Anticoagulation history: Eliquis   Past Medical History:  Diagnosis Date   Arthritis    History of kidney stones    Hyperlipidemia    Hypertension    Hypothyroidism    Neuropathy    feet   Pre-diabetes    Primary localized osteoarthritis of right knee 01/05/2021   Past Surgical History:  Procedure Laterality Date   ABDOMINAL HYSTERECTOMY  2005   BREAST ENHANCEMENT SURGERY Bilateral 2000   BUNIONECTOMY Right 2013   CARDIOVERSION N/A 02/08/2021   Procedure: CARDIOVERSION;  Surgeon: Donato Heinz, MD;  Location: [facility];  Service: Cardiovascular;  Laterality: N/A;   CARPAL TUNNEL RELEASE Right 1997   COLONOSCOPY  01/01/2013   EXCISION MORTON'S NEUROMA Bilateral 1980   right foot, 2004   KNEE ARTHROSCOPY Right 2006   2012   PAROTID GLAND TUMOR EXCISION  2009   POLYPECTOMY     SPINAL CORD STIMULATOR IMPLANT Left 2012   TARSAL TUNNEL RELEASE Right 2005   TONSILLECTOMY      Current Outpatient Medications  Medication Sig Dispense Refill   cetirizine (ZYRTEC) 10 MG tablet Take 10 mg by mouth daily.     docusate sodium (COLACE) 100 MG capsule Take 200 mg by mouth at bedtime.     ELIQUIS 5 MG TABS tablet Take 1 tablet (5 mg total) by mouth 2 (two) times daily. 180 tablet 1   FIBER PO Take 1 tablet by mouth at bedtime.  flecainide (TAMBOCOR) 50 MG tablet Take 1 tablet (50 mg total) by mouth 2 (two) times daily. 60 tablet 3   HYDROcodone-acetaminophen (NORCO/VICODIN) 5-325 MG tablet Take 1 tablet by mouth at bedtime. Pain management - Dr Hardin Negus     levothyroxine (SYNTHROID, LEVOTHROID) 75 MCG tablet Take 75 mcg by mouth daily before breakfast.     metoprolol succinate (TOPROL XL) 25 MG 24 hr tablet Take 0.5 tablets (12.5 mg total) by mouth daily. 30 tablet 11   OVER THE COUNTER MEDICATION Take 2 tablets by mouth daily. Bondi Boost supplement     potassium  gluconate 595 (99 K) MG TABS tablet Take 595 mg by mouth daily.     pregabalin (LYRICA) 200 MG capsule Take 200 mg by mouth in the morning and at bedtime.     simvastatin (ZOCOR) 20 MG tablet TAKE 1 TABLET BY MOUTH EVERYDAY AT BEDTIME 90 tablet 0   traZODone (DESYREL) 50 MG tablet TAKE 1/2 TABLET BY MOUTH AS NEEDED AT BEDTIME 45 tablet 1   No current facility-administered medications for this encounter.   Facility-Administered Medications Ordered in Other Encounters  Medication Dose Route Frequency Provider Last Rate Last Admin   fentaNYL citrate (PF) (SUBLIMAZE) injection   Intravenous Anesthesia Intra-op Eulas Post, April W, CRNA   100 mcg at 01/17/21 0707    Allergies  Allergen Reactions   Prednisone Other (See Comments)    Indigestion, dizziness    Social History   Socioeconomic History   Marital status: Divorced    Spouse name: Not on file   Number of children: Not on file   Years of education: Not on file   Highest education level: Not on file  Occupational History   Not on file  Tobacco Use   Smoking status: Former    Packs/day: 1.00    Years: 5.00    Pack years: 5.00    Types: Cigarettes    Quit date: 06/26/1980    Years since quitting: 40.7   Smokeless tobacco: Never   Tobacco comments:    Former smoker 03/02/21  Vaping Use   Vaping Use: Never used  Substance and Sexual Activity   Alcohol use: Yes    Alcohol/week: 1.0 standard drink    Types: 1 Standard drinks or equivalent per week    Comment: once a month 03/02/21   Drug use: No   Sexual activity: Not on file  Other Topics Concern   Not on file  Social History Narrative   Not on file   Social Determinants of Health   Financial Resource Strain: Not on file  Food Insecurity: Not on file  Transportation Needs: Not on file  Physical Activity: Not on file  Stress: Not on file  Social Connections: Not on file  Intimate Partner Violence: Not on file     ROS- All systems are reviewed and negative except  as per the HPI above.  Physical Exam: Vitals:   03/02/21 1037  BP: 124/82  Pulse: (!) 102  Weight: 90.1 kg  Height: 5\' 6"  (1.676 m)    GEN- The patient is a well appearing elderly obese female, alert and oriented x 3 today.   HEENT-head normocephalic, atraumatic, sclera clear, conjunctiva pink, hearing intact, trachea midline. Lungs- Clear to ausculation bilaterally, normal work of breathing Heart- irregular rate and rhythm, no murmurs, rubs or gallops  GI- soft, NT, ND, + BS Extremities- no clubbing, cyanosis, or edema MS- no significant deformity or atrophy Skin- no rash or lesion Psych- euthymic mood,  full affect Neuro- strength and sensation are intact   Wt Readings from Last 3 Encounters:  03/02/21 90.1 kg  02/08/21 88.5 kg  01/24/21 90.5 kg    EKG today demonstrates  Afib Vent. rate 102 BPM PR interval * ms QRS duration 66 ms QT/QTcB 322/419 ms  Echo 02/01/21 demonstrated  1. Left ventricular ejection fraction, by estimation, is 55 to 60%. The  left ventricle has normal function. The left ventricle has no regional  wall motion abnormalities. Left ventricular diastolic function could not  be evaluated.   2. Right ventricular systolic function is low normal. The right  ventricular size is normal. There is moderately elevated pulmonary artery systolic pressure. The estimated right ventricular systolic pressure is 29.4 mmHg.   3. Left atrial size was moderately dilated.   4. The mitral valve is grossly normal. Mild to moderate mitral valve  regurgitation.   5. The aortic valve is tricuspid. Aortic valve regurgitation is not  visualized.   6. The inferior vena cava is dilated in size with <50% respiratory  variability, suggesting right atrial pressure of 15 mmHg.   Comparison(s): No prior Echocardiogram.   Epic records are reviewed at length today  CHA2DS2-VASc Score = 4  The patient's score is based upon: CHF History: 0 HTN History: 1 Diabetes History:  0 Stroke History: 0 Vascular Disease History: 0 Age Score: 2 Gender Score: 1        ASSESSMENT AND PLAN: 1. Persistent Atrial Fibrillation (ICD10:  I48.19) The patient's CHA2DS2-VASc score is 4, indicating a 4.8% annual risk of stroke.   S/p DCCV on 02/08/21 with early return of afib. We discussed rhythm control options today including AAD and ablation.  Will start flecainide 50 mg BID. No history of CAD. Resume Toprol 12.5 mg daily If she does not convert chemically, will plan to titrate flecainide and repeat DCCV.  Her knee surgery will need to be delayed until she can safely stop anticoagulation.  Continue Eliquis 5 mg BID Kardia for home monitoring.   2. Secondary Hypercoagulable State (ICD10:  D68.69) The patient is at significant risk for stroke/thromboembolism based upon her CHA2DS2-VASc Score of 4.  Continue Apixaban (Eliquis).   3. Obesity Body mass index is 32.05 kg/m. Lifestyle modification was discussed and encouraged including regular physical activity and weight reduction.  4. HTN Stable, start BB as above and stop amlodipine.    Follow up in the AF clinic next week for ECG.    [facility] [address] [place], [place] [address] [phone] 03/02/2021 1:27 PM

## 2021-03-02 ENCOUNTER — Encounter (HOSPITAL_COMMUNITY): Payer: Self-pay | Admitting: Physician Assistant

## 2021-03-02 ENCOUNTER — Ambulatory Visit (HOSPITAL_COMMUNITY)
Admission: RE | Admit: 2021-03-02 | Discharge: 2021-03-02 | Disposition: A | Discharge status: Home / Self Care | Payer: Medicare Other | Source: Ambulatory Visit | Attending: Physician Assistant | Admitting: Physician Assistant

## 2021-03-02 ENCOUNTER — Other Ambulatory Visit: Payer: Self-pay

## 2021-03-02 VITALS — BP 124/82 | HR 102 | Ht 66.0 in | Wt 198.6 lb

## 2021-03-02 DIAGNOSIS — Z09 Encounter for follow-up examination after completed treatment for conditions other than malignant neoplasm: Secondary | ICD-10-CM | POA: Diagnosis not present

## 2021-03-02 DIAGNOSIS — Z79899 Other long term (current) drug therapy: Secondary | ICD-10-CM | POA: Diagnosis not present

## 2021-03-02 DIAGNOSIS — I10 Essential (primary) hypertension: Secondary | ICD-10-CM | POA: Diagnosis not present

## 2021-03-02 DIAGNOSIS — Z7901 Long term (current) use of anticoagulants: Secondary | ICD-10-CM | POA: Diagnosis not present

## 2021-03-02 DIAGNOSIS — E669 Obesity, unspecified: Secondary | ICD-10-CM | POA: Insufficient documentation

## 2021-03-02 DIAGNOSIS — I4819 Other persistent atrial fibrillation: Secondary | ICD-10-CM | POA: Diagnosis not present

## 2021-03-02 DIAGNOSIS — D6869 Other thrombophilia: Secondary | ICD-10-CM | POA: Diagnosis not present

## 2021-03-02 DIAGNOSIS — Z6832 Body mass index (BMI) 32.0-32.9, adult: Secondary | ICD-10-CM | POA: Insufficient documentation

## 2021-03-02 DIAGNOSIS — Z87891 Personal history of nicotine dependence: Secondary | ICD-10-CM | POA: Insufficient documentation

## 2021-03-02 MED ORDER — METOPROLOL SUCCINATE ER 25 MG PO TB24: 12.5000 mg | ORAL_TABLET | Freq: Every day | ORAL | 11 refills | Status: DC

## 2021-03-02 MED ORDER — FLECAINIDE ACETATE 50 MG PO TABS: 50.0000 mg | ORAL_TABLET | Freq: Two times a day (BID) | ORAL | 3 refills | Status: DC

## 2021-03-02 NOTE — Patient Instructions (Signed)
Stop amlodipine  Start metoprolol 12.5mg  once a day  Start flecainide 50mg  twice a day

## 2021-03-10 ENCOUNTER — Other Ambulatory Visit: Payer: Self-pay

## 2021-03-10 ENCOUNTER — Ambulatory Visit (HOSPITAL_COMMUNITY)
Admission: RE | Admit: 2021-03-10 | Discharge: 2021-03-10 | Disposition: A | Discharge status: Home / Self Care | Payer: Medicare Other | Source: Ambulatory Visit | Attending: Physician Assistant | Admitting: Physician Assistant

## 2021-03-10 VITALS — HR 63

## 2021-03-10 DIAGNOSIS — I4819 Other persistent atrial fibrillation: Secondary | ICD-10-CM

## 2021-03-10 DIAGNOSIS — I4891 Unspecified atrial fibrillation: Secondary | ICD-10-CM | POA: Diagnosis not present

## 2021-03-10 DIAGNOSIS — D6869 Other thrombophilia: Secondary | ICD-10-CM

## 2021-03-10 DIAGNOSIS — Z79899 Other long term (current) drug therapy: Secondary | ICD-10-CM | POA: Insufficient documentation

## 2021-03-10 MED ORDER — FLECAINIDE ACETATE 100 MG PO TABS: 100.0000 mg | ORAL_TABLET | Freq: Two times a day (BID) | ORAL | 3 refills | Status: DC

## 2021-03-10 MED ORDER — FLECAINIDE ACETATE 50 MG PO TABS: 100.0000 mg | ORAL_TABLET | Freq: Two times a day (BID) | ORAL | 3 refills | Status: DC

## 2021-03-10 NOTE — Progress Notes (Signed)
Patient returns for ECG after starting flecainide and resuming metoprolol. ECG shows afib HR 63, QRS 84, QTc 392. Will increase flecainide to 100 mg BID and plan for repeat DCCV. Follow up with Dr Harl Bowie as scheduled and AF clinic post DCCV.

## 2021-03-10 NOTE — Patient Instructions (Addendum)
Increase flecainide to 100mg  twice a day  Cardioversion scheduled for Monday, January 23rd  - Come to afib clinic at [place] at the [facility] and go to admitting at [place] not eat or drink anything after midnight the night prior to your procedure.  - Take all your morning medication (except diabetic medications) with a sip of water prior to arrival.  - You will not be able to drive home after your procedure.  - Do NOT miss any doses of your blood thinner - if you should miss a dose please notify our office immediately.  - If you feel as if you go back into normal rhythm prior to scheduled cardioversion, please notify our office immediately. If your procedure is canceled in the cardioversion suite you will be charged a cancellation fee. Patients will be asked to: to mask in public and hand hygiene (no longer quarantine) in the 3 days prior to surgery, to report if any COVID-19-like illness or household contacts to COVID-19 to determine need for testing

## 2021-03-17 ENCOUNTER — Encounter (HOSPITAL_COMMUNITY): Payer: Self-pay | Admitting: Cardiology

## 2021-03-23 ENCOUNTER — Telehealth (HOSPITAL_COMMUNITY): Payer: Self-pay | Admitting: *Deleted

## 2021-03-23 MED ORDER — FUROSEMIDE 20 MG PO TABS: ORAL_TABLET | ORAL | 0 refills | Status: DC

## 2021-03-23 NOTE — Telephone Encounter (Signed)
Patient called in stating she is having increased shortness of breath, weight gain, and swelling. Discussed with Adline Peals PA will start lasix 20mg  once a day for 3 days then only as needed for weight gain. Pt verbalized understanding.

## 2021-03-28 ENCOUNTER — Ambulatory Visit (HOSPITAL_COMMUNITY): Payer: Medicare Other | Admitting: Anesthesiology

## 2021-03-28 ENCOUNTER — Encounter (HOSPITAL_COMMUNITY)
Admission: RE | Disposition: A | Discharge status: Home / Self Care | Payer: Self-pay | Source: Home / Self Care | Attending: Cardiology

## 2021-03-28 ENCOUNTER — Other Ambulatory Visit: Payer: Self-pay

## 2021-03-28 ENCOUNTER — Encounter (HOSPITAL_COMMUNITY): Payer: Self-pay | Admitting: Cardiology

## 2021-03-28 ENCOUNTER — Ambulatory Visit (HOSPITAL_COMMUNITY)
Admission: RE | Admit: 2021-03-28 | Discharge: 2021-03-28 | Disposition: A | Discharge status: Home / Self Care | Payer: Medicare Other | Attending: Cardiology | Admitting: Cardiology

## 2021-03-28 ENCOUNTER — Ambulatory Visit (HOSPITAL_COMMUNITY)
Admission: RE | Admit: 2021-03-28 | Discharge: 2021-03-28 | Disposition: A | Discharge status: Home / Self Care | Payer: Medicare Other | Source: Ambulatory Visit | Attending: Physician Assistant | Admitting: Physician Assistant

## 2021-03-28 DIAGNOSIS — M1990 Unspecified osteoarthritis, unspecified site: Secondary | ICD-10-CM | POA: Diagnosis not present

## 2021-03-28 DIAGNOSIS — I10 Essential (primary) hypertension: Secondary | ICD-10-CM | POA: Insufficient documentation

## 2021-03-28 DIAGNOSIS — Z7901 Long term (current) use of anticoagulants: Secondary | ICD-10-CM | POA: Diagnosis not present

## 2021-03-28 DIAGNOSIS — E669 Obesity, unspecified: Secondary | ICD-10-CM | POA: Diagnosis not present

## 2021-03-28 DIAGNOSIS — I4891 Unspecified atrial fibrillation: Secondary | ICD-10-CM | POA: Diagnosis not present

## 2021-03-28 DIAGNOSIS — E039 Hypothyroidism, unspecified: Secondary | ICD-10-CM | POA: Diagnosis not present

## 2021-03-28 DIAGNOSIS — I340 Nonrheumatic mitral (valve) insufficiency: Secondary | ICD-10-CM | POA: Diagnosis not present

## 2021-03-28 DIAGNOSIS — Z6832 Body mass index (BMI) 32.0-32.9, adult: Secondary | ICD-10-CM | POA: Diagnosis not present

## 2021-03-28 DIAGNOSIS — D6869 Other thrombophilia: Secondary | ICD-10-CM | POA: Diagnosis not present

## 2021-03-28 DIAGNOSIS — I272 Pulmonary hypertension, unspecified: Secondary | ICD-10-CM | POA: Insufficient documentation

## 2021-03-28 DIAGNOSIS — Z87891 Personal history of nicotine dependence: Secondary | ICD-10-CM | POA: Insufficient documentation

## 2021-03-28 DIAGNOSIS — K219 Gastro-esophageal reflux disease without esophagitis: Secondary | ICD-10-CM | POA: Diagnosis not present

## 2021-03-28 HISTORY — PX: CARDIOVERSION: SHX1299

## 2021-03-28 LAB — CBC
HCT: 45.1 % (ref 36.0–46.0)
Hemoglobin: 14.7 g/dL (ref 12.0–15.0)
MCH: 30.3 pg (ref 26.0–34.0)
MCHC: 32.6 g/dL (ref 30.0–36.0)
MCV: 93 fL (ref 80.0–100.0)
Platelets: 222 10*3/uL (ref 150–400)
RBC: 4.85 MIL/uL (ref 3.87–5.11)
RDW: 13.2 % (ref 11.5–15.5)
WBC: 4.8 10*3/uL (ref 4.0–10.5)
nRBC: 0 % (ref 0.0–0.2)

## 2021-03-28 LAB — BASIC METABOLIC PANEL
Anion gap: 8 (ref 5–15)
BUN: 10 mg/dL (ref 8–23)
CO2: 32 mmol/L (ref 22–32)
Calcium: 9.2 mg/dL (ref 8.9–10.3)
Chloride: 101 mmol/L (ref 98–111)
Creatinine, Ser: 0.99 mg/dL (ref 0.44–1.00)
GFR, Estimated: 59 mL/min — ABNORMAL LOW (ref >60)
Glucose, Bld: 104 mg/dL — ABNORMAL HIGH (ref 70–99)
Potassium: 4 mmol/L (ref 3.5–5.1)
Sodium: 141 mmol/L (ref 135–145)

## 2021-03-28 SURGERY — CARDIOVERSION
Anesthesia: General

## 2021-03-28 MED ORDER — PROPOFOL 10 MG/ML IV BOLUS
INTRAVENOUS | Status: DC | PRN
Start: 2021-03-28 — End: 2021-03-28
  Administered 2021-03-28: 50 mg via INTRAVENOUS

## 2021-03-28 MED ORDER — FLECAINIDE ACETATE 50 MG PO TABS: 50.0000 mg | ORAL_TABLET | Freq: Two times a day (BID) | ORAL | 3 refills | Status: DC

## 2021-03-28 MED ORDER — SODIUM CHLORIDE 0.9 % IV SOLN
INTRAVENOUS | Status: AC | PRN
  Administered 2021-03-28: 500 mL via INTRAMUSCULAR

## 2021-03-28 MED ORDER — LIDOCAINE HCL (CARDIAC) PF 100 MG/5ML IV SOSY
PREFILLED_SYRINGE | INTRAVENOUS | Status: DC | PRN
  Administered 2021-03-28: 40 mg via INTRAVENOUS

## 2021-03-28 MED ORDER — SODIUM CHLORIDE 0.9 % IV SOLN: INTRAVENOUS | Status: DC

## 2021-03-28 NOTE — Discharge Instructions (Signed)

## 2021-03-28 NOTE — Interval H&P Note (Signed)
History and Physical Interval Note:  03/28/2021 11:20 AM  Brandi Spence  has presented today for surgery, with the diagnosis of AFIB.  The various methods of treatment have been discussed with the patient and family. After consideration of risks, benefits and other options for treatment, the patient has consented to  Procedure(s): CARDIOVERSION (N/A) as a surgical intervention.  The patient's history has been reviewed, patient examined, no change in status, stable for surgery.  I have reviewed the patient's chart and labs.  Questions were answered to the patient's satisfaction.     [employer]

## 2021-03-28 NOTE — CV Procedure (Addendum)
° ° °  Electrical Cardioversion Procedure Note Brandi Spence [id number] 1946/02/09  Procedure: Electrical Cardioversion Indications:  Atrial Fibrillation  Time Out: Verified patient identification, verified procedure,medications/allergies/relevent history reviewed, required imaging and test results available.  Performed  Procedure Details  The patient was NPO after midnight. Anesthesia was administered at the beside  by Dr.Woodrum with propofol.  Cardioversion was performed with synchronized biphasic defibrillation via AP pads with 120, 200 joules.  2 attempt(s) were performed.  The patient converted to normal sinus rhythm. The patient tolerated the procedure well   IMPRESSION:  Successful cardioversion of atrial fibrillation (HR in AFIB was in low 50's on Flecainide 100 BID and Toprol 12.5mg  QD.  Heart rate was in the low 40's, 30 min. Post CV. Asymptomatic --Will Stop Toprol 12.5mg  --Decrease Flecainide to 50mg  PO BID   Brandi Spence 03/28/2021, 11:50 AM

## 2021-03-28 NOTE — Anesthesia Preprocedure Evaluation (Addendum)
Anesthesia Evaluation  Patient identified by MRN, date of birth, ID band Patient awake    Reviewed: Allergy & Precautions, NPO status , Patient's Chart, lab work & pertinent test results, reviewed documented beta blocker date and time   Airway Mallampati: I  TM Distance: >3 FB Neck ROM: Full    Dental no notable dental hx. (+) Teeth Intact, Dental Advisory Given   Pulmonary neg pulmonary ROS, former smoker,    Pulmonary exam normal breath sounds clear to auscultation       Cardiovascular hypertension, Pt. on home beta blockers and Pt. on medications pulmonary hypertensionNormal cardiovascular exam+ dysrhythmias (on eliquis, no missed doses) Atrial Fibrillation + Valvular Problems/Murmurs (mild/mod MR) MR  Rhythm:Irregular Rate:Normal  TTE 2022 1. Left ventricular ejection fraction, by estimation, is 55 to 60%. The  left ventricle has normal function. The left ventricle has no regional  wall motion abnormalities. Left ventricular diastolic function could not  be evaluated.  2. Right ventricular systolic function is low normal. The right  ventricular size is normal. There is moderately elevated pulmonary artery  systolic pressure. The estimated right ventricular systolic pressure is  77.4 mmHg.  3. Left atrial size was moderately dilated.  4. The mitral valve is grossly normal. Mild to moderate mitral valve  regurgitation.  5. The aortic valve is tricuspid. Aortic valve regurgitation is not  visualized.  6. The inferior vena cava is dilated in size with <50% respiratory  variability, suggesting right atrial pressure of 15 mmHg.    Neuro/Psych negative neurological ROS  negative psych ROS   GI/Hepatic Neg liver ROS, GERD  ,  Endo/Other  Hypothyroidism   Renal/GU negative Renal ROS  negative genitourinary   Musculoskeletal  (+) Arthritis ,   Abdominal   Peds  Hematology negative hematology ROS (+)   Anesthesia  Other Findings   Reproductive/Obstetrics                            Anesthesia Physical Anesthesia Plan  ASA: 3  Anesthesia Plan: General   Post-op Pain Management: Minimal or no pain anticipated   Induction: Intravenous  PONV Risk Score and Plan: 3 and Propofol infusion and Treatment may vary due to age or medical condition  Airway Management Planned: Natural Airway  Additional Equipment:   Intra-op Plan:   Post-operative Plan:   Informed Consent: I have reviewed the patients History and Physical, chart, labs and discussed the procedure including the risks, benefits and alternatives for the proposed anesthesia with the patient or authorized representative who has indicated his/her understanding and acceptance.     Dental advisory given  Plan Discussed with: CRNA  Anesthesia Plan Comments:         Anesthesia Quick Evaluation

## 2021-03-28 NOTE — Transfer of Care (Signed)
Immediate Anesthesia Transfer of Care Note  Patient: Brandi Spence  Procedure(s) Performed: CARDIOVERSION  Patient Location: PACU  Anesthesia Type:General  Level of Consciousness: drowsy  Airway & Oxygen Therapy: Patient Spontanous Breathing and Patient connected to nasal cannula oxygen  Post-op Assessment: Report given to RN and Post -op Vital signs reviewed and stable  Post vital signs: Reviewed and stable  Last Vitals:  Vitals Value Taken Time  BP    Temp    Pulse    Resp    SpO2      Last Pain:  Vitals:   03/28/21 1108  TempSrc: Temporal  PainSc: 0-No pain         Complications: No notable events documented.

## 2021-03-28 NOTE — Anesthesia Postprocedure Evaluation (Signed)
Anesthesia Post Note  Patient: Brandi Spence  Procedure(s) Performed: CARDIOVERSION     Patient location during evaluation: PACU Anesthesia Type: General Level of consciousness: awake and alert Pain management: pain level controlled Vital Signs Assessment: post-procedure vital signs reviewed and stable Respiratory status: spontaneous breathing, nonlabored ventilation, respiratory function stable and patient connected to nasal cannula oxygen Cardiovascular status: blood pressure returned to baseline and stable Postop Assessment: no apparent nausea or vomiting Anesthetic complications: no   No notable events documented.  Last Vitals:  Vitals:   03/28/21 1215 03/28/21 1230  BP: (!) 98/47 (!) 94/54  Pulse:  (!) 39  Resp: 13 12  Temp:    SpO2: 96% 99%    Last Pain:  Vitals:   03/28/21 1230  TempSrc:   PainSc: 0-No pain                 Birtha Hatler L Karsyn Jamie

## 2021-03-29 ENCOUNTER — Encounter (HOSPITAL_COMMUNITY): Payer: Self-pay | Admitting: Cardiology

## 2021-03-30 ENCOUNTER — Other Ambulatory Visit (HOSPITAL_COMMUNITY): Payer: Self-pay | Admitting: Physician Assistant

## 2021-03-30 ENCOUNTER — Telehealth: Payer: Self-pay | Admitting: Internal Medicine

## 2021-03-30 NOTE — Telephone Encounter (Signed)
Patient had a cardioverson done on 3/23. They stopped the patients metropolol and hydrocodeone. Patient says that she really needs the hydrocodone due to her having neuropathy and for the ability to get some sleep. Please call back

## 2021-03-30 NOTE — Telephone Encounter (Signed)
Called patient, she states the hospital told her to stop the hydrocodone- she is not sure why, but she takes it as needed for her neuropathy. I did advise she could discuss with PA on 01/30 to follow up from her cardioversion.

## 2021-04-04 ENCOUNTER — Ambulatory Visit (HOSPITAL_COMMUNITY)
Admission: RE | Admit: 2021-04-04 | Discharge: 2021-04-04 | Disposition: A | Discharge status: Home / Self Care | Payer: Medicare Other | Source: Ambulatory Visit | Attending: Physician Assistant | Admitting: Physician Assistant

## 2021-04-04 ENCOUNTER — Other Ambulatory Visit (HOSPITAL_COMMUNITY): Payer: Self-pay

## 2021-04-04 ENCOUNTER — Other Ambulatory Visit: Payer: Self-pay

## 2021-04-04 VITALS — BP 186/84 | HR 53 | Ht 66.0 in | Wt 196.2 lb

## 2021-04-04 DIAGNOSIS — Z7901 Long term (current) use of anticoagulants: Secondary | ICD-10-CM | POA: Insufficient documentation

## 2021-04-04 DIAGNOSIS — D6869 Other thrombophilia: Secondary | ICD-10-CM | POA: Diagnosis not present

## 2021-04-04 DIAGNOSIS — I4819 Other persistent atrial fibrillation: Secondary | ICD-10-CM | POA: Insufficient documentation

## 2021-04-04 DIAGNOSIS — E785 Hyperlipidemia, unspecified: Secondary | ICD-10-CM | POA: Diagnosis not present

## 2021-04-04 DIAGNOSIS — Z79899 Other long term (current) drug therapy: Secondary | ICD-10-CM | POA: Diagnosis not present

## 2021-04-04 DIAGNOSIS — Z6831 Body mass index (BMI) 31.0-31.9, adult: Secondary | ICD-10-CM | POA: Diagnosis not present

## 2021-04-04 DIAGNOSIS — I10 Essential (primary) hypertension: Secondary | ICD-10-CM | POA: Diagnosis not present

## 2021-04-04 DIAGNOSIS — E039 Hypothyroidism, unspecified: Secondary | ICD-10-CM | POA: Diagnosis not present

## 2021-04-04 DIAGNOSIS — E669 Obesity, unspecified: Secondary | ICD-10-CM | POA: Diagnosis not present

## 2021-04-04 MED ORDER — DILTIAZEM HCL 30 MG PO TABS: 30.0000 mg | ORAL_TABLET | Freq: Two times a day (BID) | ORAL | 3 refills | Status: DC

## 2021-04-04 MED ORDER — FUROSEMIDE 20 MG PO TABS: ORAL_TABLET | ORAL | Status: DC

## 2021-04-04 MED ORDER — LISINOPRIL-HYDROCHLOROTHIAZIDE 20-25 MG PO TABS: 1.0000 | ORAL_TABLET | Freq: Every day | ORAL | 3 refills | Status: DC

## 2021-04-04 MED ORDER — LISINOPRIL-HYDROCHLOROTHIAZIDE 20-25 MG PO TABS: 1.0000 | ORAL_TABLET | Freq: Every day | ORAL | 6 refills | Status: DC

## 2021-04-04 MED ORDER — ATORVASTATIN CALCIUM 10 MG PO TABS: 10.0000 mg | ORAL_TABLET | Freq: Every day | ORAL | 3 refills | Status: DC

## 2021-04-04 NOTE — Progress Notes (Signed)
Primary Care Physician: Isaac Bliss, Rayford Halsted, MD Primary Cardiologist: Dr Phineas Inches Primary Electrophysiologist: none Referring Physician: Dr Phineas Inches   Brandi Spence is a 76 y.o. female with a history of HTN, hypothyroid, HLD, atrial fibrillation who presents for consultation in the [facility]. The patient was initially diagnosed with atrial fibrillation 11/04/20 at a visit with her PCP although she was not told she had afib at that time. She was unaware of her arrhythmia with no symptoms. She was scheduled for total knee replacement on 01/17/21 but this was cancelled due to patient being in afib. Patient is on Eliquis for a CHADS2VASC score of 4. Seen by Dr Harl Bowie on 01/18/21 and started on BB. Patient is s/p DCCV on 02/08/21. Unfortunately, she was back in afib at follow up. She purchased a Kardia mobile device on 02/11/21 which showed afib at that time.   On follow up today, patient is s/p DCCV on 03/28/21. Her BB was stopped and her flecainide was decreased due to bradycardia. She does feel better today in SR with less dyspnea on exertion. She has noticed her BP has been more elevated.   Today, she denies symptoms of palpitations, chest pain, shortness of breath, orthopnea, PND, lower extremity edema, dizziness, presyncope, syncope, snoring, daytime somnolence, bleeding, or neurologic sequela. The patient is tolerating medications without difficulties and is otherwise without complaint today.    Atrial Fibrillation Risk Factors:  she does not have symptoms or diagnosis of sleep apnea. she does not have a history of rheumatic fever. she does not have a history of alcohol use. The patient does not have a history of early familial atrial fibrillation or other arrhythmias.  she has a BMI of Body mass index is 31.67 kg/m.Marland Kitchen Filed Weights   04/04/21 1031  Weight: 89 kg      Family History  Problem Relation Age of Onset   Colon cancer Father 8    Colon polyps Father    Esophageal cancer Neg Hx    Rectal cancer Neg Hx    Stomach cancer Neg Hx      Atrial Fibrillation Management history:  Previous antiarrhythmic drugs: flecainide  Previous cardioversions: 02/08/21, 03/28/21 Previous ablations: none CHADS2VASC score: 4 Anticoagulation history: Eliquis   Past Medical History:  Diagnosis Date   Arthritis    History of kidney stones    Hyperlipidemia    Hypertension    Hypothyroidism    Neuropathy    feet   Pre-diabetes    Primary localized osteoarthritis of right knee 01/05/2021   Past Surgical History:  Procedure Laterality Date   ABDOMINAL HYSTERECTOMY  2005   BREAST ENHANCEMENT SURGERY Bilateral 2000   BUNIONECTOMY Right 2013   CARDIOVERSION N/A 02/08/2021   Procedure: CARDIOVERSION;  Surgeon: Donato Heinz, MD;  Location[facility] ENDOSCOPY;  Service: Cardiovascular;  Laterality: N/A;   CARDIOVERSION N/A 03/28/2021   Procedure: CARDIOVERSION;  Surgeon: Jerline Pain, MD;  Location: [place] ENDOSCOPY;  Service: Cardiovascular;  Laterality: N/A;   CARPAL TUNNEL RELEASE Right 1997   COLONOSCOPY  01/01/2013   EXCISION MORTON'S NEUROMA Bilateral 1980   right foot, 2004   KNEE ARTHROSCOPY Right 2006   2012   PAROTID GLAND TUMOR EXCISION  2009   POLYPECTOMY     SPINAL CORD STIMULATOR IMPLANT Left 2012   TARSAL TUNNEL RELEASE Right 2005   TONSILLECTOMY      Current Outpatient Medications  Medication Sig Dispense Refill   cetirizine (ZYRTEC) 10 MG  tablet Take 10 mg by mouth at bedtime.     diltiazem (CARDIZEM) 30 MG tablet Take 1 tablet (30 mg total) by mouth 2 (two) times daily. 60 tablet 3   docusate sodium (COLACE) 100 MG capsule Take 100 mg by mouth at bedtime.     ELIQUIS 5 MG TABS tablet Take 1 tablet (5 mg total) by mouth 2 (two) times daily. 180 tablet 1   FIBER PO Take 1 tablet by mouth at bedtime.     flecainide (TAMBOCOR) 50 MG tablet Take 1 tablet (50 mg total) by mouth 2 (two) times daily. 90 tablet  3   levothyroxine (SYNTHROID, LEVOTHROID) 75 MCG tablet Take 75 mcg by mouth daily before breakfast.     lisinopril-hydrochlorothiazide (ZESTORETIC) 20-25 MG tablet Take 1 tablet by mouth daily. 30 tablet 3   OVER THE COUNTER MEDICATION Take 2 tablets by mouth daily. BondiBoost Hair Growth Supplement     potassium gluconate 595 (99 K) MG TABS tablet Take 595 mg by mouth daily.     pregabalin (LYRICA) 200 MG capsule Take 200 mg by mouth in the morning and at bedtime.     simvastatin (ZOCOR) 20 MG tablet TAKE 1 TABLET BY MOUTH EVERYDAY AT BEDTIME 90 tablet 0   traZODone (DESYREL) 50 MG tablet TAKE 1/2 TABLET BY MOUTH AS NEEDED AT BEDTIME (Patient taking differently: Take 25 mg by mouth at bedtime.) 45 tablet 1   furosemide (LASIX) 20 MG tablet Taking one tablet by mouth as needed for swelling     No current facility-administered medications for this encounter.   Facility-Administered Medications Ordered in Other Encounters  Medication Dose Route Frequency Provider Last Rate Last Admin   fentaNYL citrate (PF) (SUBLIMAZE) injection   Intravenous Anesthesia Intra-op Eulas Post, April W, CRNA   100 mcg at 01/17/21 0707    Allergies  Allergen Reactions   Prednisone Other (See Comments)    Could not sleep and agitated    Social History   Socioeconomic History   Marital status: Divorced    Spouse name: Not on file   Number of children: Not on file   Years of education: Not on file   Highest education level: Not on file  Occupational History   Not on file  Tobacco Use   Smoking status: Former    Packs/day: 1.00    Years: 5.00    Pack years: 5.00    Types: Cigarettes    Quit date: 06/26/1980    Years since quitting: 40.8   Smokeless tobacco: Never   Tobacco comments:    Former smoker 03/02/21  Vaping Use   Vaping Use: Never used  Substance and Sexual Activity   Alcohol use: Yes    Alcohol/week: 1.0 standard drink    Types: 1 Standard drinks or equivalent per week    Comment: once a  month 03/02/21   Drug use: No   Sexual activity: Not on file  Other Topics Concern   Not on file  Social History Narrative   Not on file   Social Determinants of Health   Financial Resource Strain: Not on file  Food Insecurity: Not on file  Transportation Needs: Not on file  Physical Activity: Not on file  Stress: Not on file  Social Connections: Not on file  Intimate Partner Violence: Not on file     ROS- All systems are reviewed and negative except as per the HPI above.  Physical Exam: Vitals:   04/04/21 1031  BP: (!) 186/84  Pulse: (!) 53  Weight: 89 kg  Height: 5\' 6"  (1.676 m)    GEN- The patient is a well appearing obese elderly female, alert and oriented x 3 today.   HEENT-head normocephalic, atraumatic, sclera clear, conjunctiva pink, hearing intact, trachea midline. Lungs- Clear to ausculation bilaterally, normal work of breathing Heart- Regular rate and rhythm, bradycardia, no murmurs, rubs or gallops  GI- soft, NT, ND, + BS Extremities- no clubbing, cyanosis, or edema MS- no significant deformity or atrophy Skin- no rash or lesion Psych- euthymic mood, full affect Neuro- strength and sensation are intact   Wt Readings from Last 3 Encounters:  04/04/21 89 kg  03/28/21 88.9 kg  03/02/21 90.1 kg    EKG today demonstrates  SB, 1st degree AV block Vent. rate 53 BPM PR interval 236 ms QRS duration 84 ms QT/QTcB 448/420 ms  Echo 02/01/21 demonstrated  1. Left ventricular ejection fraction, by estimation, is 55 to 60%. The  left ventricle has normal function. The left ventricle has no regional  wall motion abnormalities. Left ventricular diastolic function could not  be evaluated.   2. Right ventricular systolic function is low normal. The right  ventricular size is normal. There is moderately elevated pulmonary artery systolic pressure. The estimated right ventricular systolic pressure is 22.6 mmHg.   3. Left atrial size was moderately dilated.   4.  The mitral valve is grossly normal. Mild to moderate mitral valve  regurgitation.   5. The aortic valve is tricuspid. Aortic valve regurgitation is not  visualized.   6. The inferior vena cava is dilated in size with <50% respiratory  variability, suggesting right atrial pressure of 15 mmHg.   Comparison(s): No prior Echocardiogram.   Epic records are reviewed at length today  CHA2DS2-VASc Score = 4  The patient's score is based upon: CHF History: 0 HTN History: 1 Diabetes History: 0 Stroke History: 0 Vascular Disease History: 0 Age Score: 2 Gender Score: 1        ASSESSMENT AND PLAN: 1. Persistent Atrial Fibrillation (ICD10:  I48.19) The patient's CHA2DS2-VASc score is 4, indicating a 4.8% annual risk of stroke.   S/p DCCV 03/28/21 Patient appears to be maintaining SR. Continue flecainide 50 mg BID Continue Eliquis 5 mg BID. Will need to continue for 4 weeks uninterrupted after DCCV.  Will start diltiazem 30 mg BID to oppose rapid atrial flutter with flecainide.  Kardia for home monitoring.   2. Secondary Hypercoagulable State (ICD10:  D68.69) The patient is at significant risk for stroke/thromboembolism based upon her CHA2DS2-VASc Score of 4.  Continue Apixaban (Eliquis).   3. Obesity Body mass index is 31.67 kg/m. Lifestyle modification was discussed and encouraged including regular physical activity and weight reduction.  4. HTN Elevated today and at home.  Will resume lisinopril-HCTZ at previous dose.   5. HLD Will change simvastatin to atorvastatin to avoid interaction with diltiazem.    Follow up with Dr Phineas Inches as scheduled.    [facility] [address] [place], [place] [address] [phone] 04/04/2021 11:32 AM

## 2021-04-04 NOTE — Patient Instructions (Addendum)
Start cardizem 30mg  twice a day  Resume Lisinopril/HCTZ  Stop simvastatin  Start Atorvastatin 10mg  once a day

## 2021-04-07 ENCOUNTER — Telehealth (HOSPITAL_COMMUNITY): Payer: Self-pay | Admitting: *Deleted

## 2021-04-07 MED ORDER — LISINOPRIL 20 MG PO TABS: 20.0000 mg | ORAL_TABLET | Freq: Every day | ORAL | 3 refills | Status: DC

## 2021-04-07 NOTE — Telephone Encounter (Signed)
Patient called in stating she is having significant low BP with dizziness and lightheadedness. BP was 65/45 earlier. Discussed with Adline Peals PA will stop lisinopril/hctz and change to lisinopril 20mg  once a day. Encouraged increased oral intake today to help with BP. Pt in agreement.

## 2021-04-08 ENCOUNTER — Telehealth (HOSPITAL_COMMUNITY): Payer: Self-pay | Admitting: *Deleted

## 2021-04-08 MED ORDER — METOPROLOL SUCCINATE ER 25 MG PO TB24: 12.5000 mg | ORAL_TABLET | Freq: Every day | ORAL | Status: DC

## 2021-04-08 NOTE — Telephone Encounter (Signed)
Patient called in stating she was back in AF this morning. HR 122 BP 135/57 Discussed with Adline Peals PA will stop lisinopril resume metoprolol 12.5mg  once a day and follow up next week. Pt in agreement.

## 2021-04-11 ENCOUNTER — Other Ambulatory Visit (HOSPITAL_COMMUNITY): Payer: Self-pay

## 2021-04-11 MED ORDER — FUROSEMIDE 20 MG PO TABS: ORAL_TABLET | ORAL | 0 refills | Status: DC

## 2021-04-12 ENCOUNTER — Ambulatory Visit: Payer: Medicare Other | Admitting: Internal Medicine

## 2021-04-13 ENCOUNTER — Encounter (HOSPITAL_COMMUNITY): Payer: Self-pay | Admitting: Physician Assistant

## 2021-04-13 ENCOUNTER — Ambulatory Visit (HOSPITAL_COMMUNITY)
Admission: RE | Admit: 2021-04-13 | Discharge: 2021-04-13 | Disposition: A | Discharge status: Home / Self Care | Payer: Medicare Other | Source: Ambulatory Visit | Attending: Physician Assistant | Admitting: Physician Assistant

## 2021-04-13 ENCOUNTER — Other Ambulatory Visit: Payer: Self-pay

## 2021-04-13 ENCOUNTER — Telehealth: Payer: Self-pay | Admitting: Pharmacist

## 2021-04-13 VITALS — BP 138/98 | HR 72 | Ht 66.0 in | Wt 195.6 lb

## 2021-04-13 DIAGNOSIS — Z87891 Personal history of nicotine dependence: Secondary | ICD-10-CM | POA: Insufficient documentation

## 2021-04-13 DIAGNOSIS — E785 Hyperlipidemia, unspecified: Secondary | ICD-10-CM | POA: Diagnosis not present

## 2021-04-13 DIAGNOSIS — I119 Hypertensive heart disease without heart failure: Secondary | ICD-10-CM | POA: Diagnosis not present

## 2021-04-13 DIAGNOSIS — D6869 Other thrombophilia: Secondary | ICD-10-CM | POA: Insufficient documentation

## 2021-04-13 DIAGNOSIS — I4819 Other persistent atrial fibrillation: Secondary | ICD-10-CM | POA: Diagnosis not present

## 2021-04-13 DIAGNOSIS — Z7901 Long term (current) use of anticoagulants: Secondary | ICD-10-CM | POA: Insufficient documentation

## 2021-04-13 DIAGNOSIS — E039 Hypothyroidism, unspecified: Secondary | ICD-10-CM | POA: Insufficient documentation

## 2021-04-13 DIAGNOSIS — I340 Nonrheumatic mitral (valve) insufficiency: Secondary | ICD-10-CM | POA: Diagnosis not present

## 2021-04-13 DIAGNOSIS — Z79899 Other long term (current) drug therapy: Secondary | ICD-10-CM | POA: Diagnosis not present

## 2021-04-13 LAB — MAGNESIUM: Magnesium: 2.1 mg/dL (ref 1.7–2.4)

## 2021-04-13 MED ORDER — DILTIAZEM HCL 30 MG PO TABS: ORAL_TABLET | ORAL | 3 refills | Status: DC

## 2021-04-13 NOTE — Progress Notes (Signed)
Primary Care Physician: Isaac Bliss, Rayford Halsted, MD Primary Cardiologist: Dr Phineas Inches Primary Electrophysiologist: none Referring Physician: Dr Phineas Inches   Brandi Spence is a 76 y.o. female with a history of HTN, hypothyroid, HLD, atrial fibrillation who presents for follow up in the [facility]. The patient was initially diagnosed with atrial fibrillation 11/04/20 at a visit with her PCP although she was not told she had afib at that time. She was unaware of her arrhythmia with no symptoms. She was scheduled for total knee replacement on 01/17/21 but this was cancelled due to patient being in afib. Patient is on Eliquis for a CHADS2VASC score of 4. Seen by Dr Harl Bowie on 01/18/21 and started on BB. Patient is s/p DCCV on 02/08/21. Unfortunately, she was back in afib at follow up. She purchased a Kardia mobile device on 02/11/21 which showed afib at that time. Patient is s/p DCCV on 03/28/21. Her BB was stopped and her flecainide was decreased due to bradycardia. She did feel better in SR with less dyspnea on exertion.   On follow up today, patient reports she went back into afib soon after her last visit. She doesn't feel as "winded" as before the DCCV but is not at  her baseline either. No bleeding issues on anticoagulation.    Today, she denies symptoms of palpitations, chest pain, shortness of breath, orthopnea, PND, lower extremity edema, dizziness, presyncope, syncope, snoring, daytime somnolence, bleeding, or neurologic sequela. The patient is tolerating medications without difficulties and is otherwise without complaint today.    Atrial Fibrillation Risk Factors:  she does not have symptoms or diagnosis of sleep apnea. she does not have a history of rheumatic fever. she does not have a history of alcohol use. The patient does not have a history of early familial atrial fibrillation or other arrhythmias.  she has a BMI of Body mass index is 31.57  kg/m.Marland Kitchen Filed Weights   04/13/21 1131  Weight: 88.7 kg       Family History  Problem Relation Age of Onset   Colon cancer Father 46   Colon polyps Father    Esophageal cancer Neg Hx    Rectal cancer Neg Hx    Stomach cancer Neg Hx      Atrial Fibrillation Management history:  Previous antiarrhythmic drugs: flecainide  Previous cardioversions: 02/08/21, 03/28/21 Previous ablations: none CHADS2VASC score: 4 Anticoagulation history: Eliquis   Past Medical History:  Diagnosis Date   Arthritis    History of kidney stones    Hyperlipidemia    Hypertension    Hypothyroidism    Neuropathy    feet   Pre-diabetes    Primary localized osteoarthritis of right knee 01/05/2021   Past Surgical History:  Procedure Laterality Date   ABDOMINAL HYSTERECTOMY  2005   BREAST ENHANCEMENT SURGERY Bilateral 2000   BUNIONECTOMY Right 2013   CARDIOVERSION N/A 02/08/2021   Procedure: CARDIOVERSION;  Surgeon: Donato Heinz, MD;  Location: [facility] ENDOSCOPY;  Service: Cardiovascular;  Laterality: N/A;   CARDIOVERSION N/A 03/28/2021   Procedure: CARDIOVERSION;  Surgeon: Jerline Pain, MD;  Location: [facility] ENDOSCOPY;  Service: Cardiovascular;  Laterality: N/A;   CARPAL TUNNEL RELEASE Right 1997   COLONOSCOPY  01/01/2013   EXCISION MORTON'S NEUROMA Bilateral 1980   right foot, 2004   KNEE ARTHROSCOPY Right 2006   2012   PAROTID GLAND TUMOR EXCISION  2009   POLYPECTOMY     SPINAL CORD STIMULATOR IMPLANT Left 2012  TARSAL TUNNEL RELEASE Right 2005   TONSILLECTOMY      Current Outpatient Medications  Medication Sig Dispense Refill   atorvastatin (LIPITOR) 10 MG tablet Take 1 tablet (10 mg total) by mouth daily. 30 tablet 3   cetirizine (ZYRTEC) 10 MG tablet Take 10 mg by mouth at bedtime.     diltiazem (CARDIZEM) 30 MG tablet Take 1 tablet (30 mg total) by mouth 2 (two) times daily. 60 tablet 3   docusate sodium (COLACE) 100 MG capsule Take 100 mg by mouth at bedtime.     ELIQUIS 5 MG  TABS tablet Take 1 tablet (5 mg total) by mouth 2 (two) times daily. 180 tablet 1   FIBER PO Take 1 tablet by mouth at bedtime.     flecainide (TAMBOCOR) 50 MG tablet Take 1 tablet (50 mg total) by mouth 2 (two) times daily. 90 tablet 3   furosemide (LASIX) 20 MG tablet Taking one tablet by mouth as needed for swelling 30 tablet 0   levothyroxine (SYNTHROID, LEVOTHROID) 75 MCG tablet Take 75 mcg by mouth daily before breakfast.     metoprolol succinate (TOPROL XL) 25 MG 24 hr tablet Take 0.5 tablets (12.5 mg total) by mouth daily.     OVER THE COUNTER MEDICATION Take 2 tablets by mouth daily. BondiBoost Hair Growth Supplement     potassium gluconate 595 (99 K) MG TABS tablet Take 595 mg by mouth daily.     pregabalin (LYRICA) 200 MG capsule Take 200 mg by mouth in the morning and at bedtime.     traZODone (DESYREL) 50 MG tablet TAKE 1/2 TABLET BY MOUTH AS NEEDED AT BEDTIME 45 tablet 1   No current facility-administered medications for this encounter.   Facility-Administered Medications Ordered in Other Encounters  Medication Dose Route Frequency Provider Last Rate Last Admin   fentaNYL citrate (PF) (SUBLIMAZE) injection   Intravenous Anesthesia Intra-op Eulas Post, April W, CRNA   100 mcg at 01/17/21 0707    Allergies  Allergen Reactions   Prednisone Other (See Comments)    Could not sleep and agitated    Social History   Socioeconomic History   Marital status: Divorced    Spouse name: Not on file   Number of children: Not on file   Years of education: Not on file   Highest education level: Not on file  Occupational History   Not on file  Tobacco Use   Smoking status: Former    Packs/day: 1.00    Years: 5.00    Pack years: 5.00    Types: Cigarettes    Quit date: 06/26/1980    Years since quitting: 40.8   Smokeless tobacco: Never   Tobacco comments:    Former smoker 03/02/21  Vaping Use   Vaping Use: Never used  Substance and Sexual Activity   Alcohol use: Yes     Alcohol/week: 1.0 standard drink    Types: 1 Standard drinks or equivalent per week    Comment: once a month 03/02/21   Drug use: No   Sexual activity: Not on file  Other Topics Concern   Not on file  Social History Narrative   Not on file   Social Determinants of Health   Financial Resource Strain: Not on file  Food Insecurity: Not on file  Transportation Needs: Not on file  Physical Activity: Not on file  Stress: Not on file  Social Connections: Not on file  Intimate Partner Violence: Not on file     ROS-  All systems are reviewed and negative except as per the HPI above.  Physical Exam: Vitals:   04/13/21 1131  BP: (!) 138/98  Pulse: 72  Weight: 88.7 kg  Height: 5\' 6"  (1.676 m)    GEN- The patient is a well appearing elderly obese female, alert and oriented x 3 today.   HEENT-head normocephalic, atraumatic, sclera clear, conjunctiva pink, hearing intact, trachea midline. Lungs- Clear to ausculation bilaterally, normal work of breathing Heart- irregular rate and rhythm, no murmurs, rubs or gallops  GI- soft, NT, ND, + BS Extremities- no clubbing, cyanosis, or edema MS- no significant deformity or atrophy Skin- no rash or lesion Psych- euthymic mood, full affect Neuro- strength and sensation are intact   Wt Readings from Last 3 Encounters:  04/13/21 88.7 kg  04/04/21 89 kg  03/28/21 88.9 kg    EKG today demonstrates  Afib Vent. rate 72 BPM PR interval * ms QRS duration 82 ms QT/QTcB 386/422 ms  Echo 02/01/21 demonstrated  1. Left ventricular ejection fraction, by estimation, is 55 to 60%. The  left ventricle has normal function. The left ventricle has no regional  wall motion abnormalities. Left ventricular diastolic function could not  be evaluated.   2. Right ventricular systolic function is low normal. The right  ventricular size is normal. There is moderately elevated pulmonary artery systolic pressure. The estimated right ventricular systolic  pressure is 85.4 mmHg.   3. Left atrial size was moderately dilated.   4. The mitral valve is grossly normal. Mild to moderate mitral valve  regurgitation.   5. The aortic valve is tricuspid. Aortic valve regurgitation is not  visualized.   6. The inferior vena cava is dilated in size with <50% respiratory  variability, suggesting right atrial pressure of 15 mmHg.   Comparison(s): No prior Echocardiogram.   Epic records are reviewed at length today  CHA2DS2-VASc Score = 4  The patient's score is based upon: CHF History: 0 HTN History: 1 Diabetes History: 0 Stroke History: 0 Vascular Disease History: 0 Age Score: 2 Gender Score: 1       ASSESSMENT AND PLAN: 1. Persistent Atrial Fibrillation (ICD10:  I48.19) The patient's CHA2DS2-VASc score is 4, indicating a 4.8% annual risk of stroke.   S/p DCCV 03/28/21 with early return of afib again. We discussed rhythm control options, bradycardia has limited therapy. Will stop flecainide today as it appears ineffectual.  After discussing the risks and benefits, will plan for dofetilide admission.  Patient to check on price of dofetilide. Continue Eliquis 5 mg BID PharmD to screen medications. She will need to stop trazodone.  QTc in SR 420 ms Recent bmet reviewed, check magnesium today. Continue metoprolol 12.5 mg daily Change diltiazem 30 mg to PRN q 4 hours for heart racing.  Kardia for home monitoring.  If she fails dofetilide, she is agreeable to discussing ablation.   2. Secondary Hypercoagulable State (ICD10:  D68.69) The patient is at significant risk for stroke/thromboembolism based upon her CHA2DS2-VASc Score of 4.  Continue Apixaban (Eliquis).   3. Obesity Body mass index is 31.57 kg/m. Lifestyle modification was discussed and encouraged including regular physical activity and weight reduction.  4. HTN Stable, no changes today.  5. Valvular heart disease Mild to moderate MR   Follow up in the AF clinic for  dofetilide admission.    [facility] [address] [place], [place] [address] [phone] 04/13/2021 11:45 AM

## 2021-04-13 NOTE — Telephone Encounter (Signed)
Pt notified to stop trazodone.  

## 2021-04-13 NOTE — Telephone Encounter (Signed)
Medication list reviewed in anticipation of upcoming Tikosyn initiation. Patient is taking trazodone as needed for sleep. Would recommend stopping this since it's QTc prolonging. No contraindicated meds noted. Flecainide was stopped today.  Patient is anticoagulated on Eliquis 5mg  BID on the appropriate dose. Please ensure that patient has not missed any anticoagulation doses in the 3 weeks prior to Tikosyn initiation.   Patient will need to be counseled to avoid use of Benadryl while on Tikosyn and in the 2-3 days prior to Tikosyn initiation.

## 2021-04-13 NOTE — Patient Instructions (Signed)
Stop flecainide   Will need to stop trazodone --- let Brandi Spence know replacement

## 2021-04-16 DIAGNOSIS — Z20822 Contact with and (suspected) exposure to covid-19: Secondary | ICD-10-CM | POA: Diagnosis not present

## 2021-04-21 ENCOUNTER — Ambulatory Visit: Payer: Medicare Other | Admitting: Internal Medicine

## 2021-05-02 ENCOUNTER — Other Ambulatory Visit: Payer: Self-pay | Admitting: Internal Medicine

## 2021-05-02 DIAGNOSIS — E7800 Pure hypercholesterolemia, unspecified: Secondary | ICD-10-CM

## 2021-05-09 ENCOUNTER — Other Ambulatory Visit (HOSPITAL_COMMUNITY): Payer: Self-pay | Admitting: Physician Assistant

## 2021-05-09 LAB — SARS CORONAVIRUS 2 (TAT 6-24 HRS): SARS Coronavirus 2: NEGATIVE

## 2021-05-10 ENCOUNTER — Other Ambulatory Visit: Payer: Self-pay

## 2021-05-10 ENCOUNTER — Ambulatory Visit (HOSPITAL_COMMUNITY)
Admission: RE | Admit: 2021-05-10 | Discharge: 2021-05-10 | Disposition: A | Discharge status: Home / Self Care | Payer: Medicare Other | Source: Ambulatory Visit | Attending: Physician Assistant | Admitting: Physician Assistant

## 2021-05-10 ENCOUNTER — Inpatient Hospital Stay (HOSPITAL_COMMUNITY)
Admission: AD | Admit: 2021-05-10 | Discharge: 2021-05-13 | DRG: 309 | Disposition: A | Discharge status: Home / Self Care | Payer: Medicare Other | Attending: Cardiology | Admitting: Cardiology

## 2021-05-10 ENCOUNTER — Encounter (HOSPITAL_COMMUNITY): Payer: Self-pay | Admitting: Internal Medicine

## 2021-05-10 ENCOUNTER — Encounter (HOSPITAL_COMMUNITY): Payer: Self-pay | Admitting: Physician Assistant

## 2021-05-10 VITALS — BP 146/90 | HR 96 | Ht 66.0 in | Wt 197.8 lb

## 2021-05-10 DIAGNOSIS — E785 Hyperlipidemia, unspecified: Secondary | ICD-10-CM | POA: Diagnosis present

## 2021-05-10 DIAGNOSIS — D6869 Other thrombophilia: Secondary | ICD-10-CM | POA: Diagnosis present

## 2021-05-10 DIAGNOSIS — M1711 Unilateral primary osteoarthritis, right knee: Secondary | ICD-10-CM | POA: Diagnosis present

## 2021-05-10 DIAGNOSIS — Z90710 Acquired absence of both cervix and uterus: Secondary | ICD-10-CM

## 2021-05-10 DIAGNOSIS — G629 Polyneuropathy, unspecified: Secondary | ICD-10-CM | POA: Diagnosis present

## 2021-05-10 DIAGNOSIS — I4819 Other persistent atrial fibrillation: Secondary | ICD-10-CM | POA: Diagnosis present

## 2021-05-10 DIAGNOSIS — Z888 Allergy status to other drugs, medicaments and biological substances status: Secondary | ICD-10-CM

## 2021-05-10 DIAGNOSIS — E039 Hypothyroidism, unspecified: Secondary | ICD-10-CM | POA: Diagnosis present

## 2021-05-10 DIAGNOSIS — Z7901 Long term (current) use of anticoagulants: Secondary | ICD-10-CM | POA: Diagnosis not present

## 2021-05-10 DIAGNOSIS — I10 Essential (primary) hypertension: Secondary | ICD-10-CM | POA: Diagnosis not present

## 2021-05-10 DIAGNOSIS — E669 Obesity, unspecified: Secondary | ICD-10-CM | POA: Diagnosis present

## 2021-05-10 DIAGNOSIS — M1990 Unspecified osteoarthritis, unspecified site: Secondary | ICD-10-CM | POA: Diagnosis not present

## 2021-05-10 DIAGNOSIS — Z6831 Body mass index (BMI) 31.0-31.9, adult: Secondary | ICD-10-CM

## 2021-05-10 DIAGNOSIS — Z87442 Personal history of urinary calculi: Secondary | ICD-10-CM | POA: Diagnosis not present

## 2021-05-10 DIAGNOSIS — R7303 Prediabetes: Secondary | ICD-10-CM | POA: Diagnosis present

## 2021-05-10 DIAGNOSIS — I4891 Unspecified atrial fibrillation: Secondary | ICD-10-CM | POA: Diagnosis not present

## 2021-05-10 LAB — BASIC METABOLIC PANEL
Anion gap: 7 (ref 5–15)
Anion gap: 6 (ref 5–15)
BUN: 18 mg/dL (ref 8–23)
BUN: 18 mg/dL (ref 8–23)
CO2: 29 mmol/L (ref 22–32)
CO2: 30 mmol/L (ref 22–32)
Calcium: 8.8 mg/dL — ABNORMAL LOW (ref 8.9–10.3)
Calcium: 9 mg/dL (ref 8.9–10.3)
Chloride: 103 mmol/L (ref 98–111)
Chloride: 103 mmol/L (ref 98–111)
Creatinine, Ser: 0.83 mg/dL (ref 0.44–1.00)
Creatinine, Ser: 0.83 mg/dL (ref 0.44–1.00)
GFR, Estimated: >60 mL/min (ref >60)
GFR, Estimated: >60 mL/min (ref >60)
Glucose, Bld: 82 mg/dL (ref 70–99)
Glucose, Bld: 115 mg/dL — ABNORMAL HIGH (ref 70–99)
Potassium: 3.6 mmol/L (ref 3.5–5.1)
Potassium: 4.2 mmol/L (ref 3.5–5.1)
Sodium: 139 mmol/L (ref 135–145)
Sodium: 139 mmol/L (ref 135–145)

## 2021-05-10 LAB — MAGNESIUM: Magnesium: 2 mg/dL (ref 1.7–2.4)

## 2021-05-10 MED ORDER — PREGABALIN 100 MG PO CAPS
200.0000 mg | ORAL_CAPSULE | Freq: Every day | ORAL | Status: DC
  Administered 2021-05-10: 200 mg via ORAL
  Filled 2021-05-10: qty 2

## 2021-05-10 MED ORDER — SODIUM CHLORIDE 0.9% FLUSH
3.0000 mL | Freq: Two times a day (BID) | INTRAVENOUS | Status: DC
  Administered 2021-05-10 – 2021-05-13 (×5): 3 mL via INTRAVENOUS

## 2021-05-10 MED ORDER — MAGNESIUM SULFATE 2 GM/50ML IV SOLN
2.0000 g | Freq: Once | INTRAVENOUS | Status: AC
  Administered 2021-05-10: 2 g via INTRAVENOUS
  Filled 2021-05-10: qty 50

## 2021-05-10 MED ORDER — SODIUM CHLORIDE 0.9 % IV SOLN: 250.0000 mL | INTRAVENOUS | Status: DC | PRN

## 2021-05-10 MED ORDER — LORATADINE 10 MG PO TABS
10.0000 mg | ORAL_TABLET | Freq: Every day | ORAL | Status: DC
  Administered 2021-05-10 – 2021-05-12 (×3): 10 mg via ORAL
  Filled 2021-05-10 (×3): qty 1

## 2021-05-10 MED ORDER — FUROSEMIDE 20 MG PO TABS: 20.0000 mg | ORAL_TABLET | Freq: Every day | ORAL | Status: DC | PRN

## 2021-05-10 MED ORDER — POTASSIUM CHLORIDE CRYS ER 20 MEQ PO TBCR
60.0000 meq | EXTENDED_RELEASE_TABLET | Freq: Once | ORAL | Status: AC
  Administered 2021-05-10: 60 meq via ORAL
  Filled 2021-05-10: qty 3

## 2021-05-10 MED ORDER — DOFETILIDE 500 MCG PO CAPS
500.0000 ug | ORAL_CAPSULE | Freq: Two times a day (BID) | ORAL | Status: DC
  Administered 2021-05-10 – 2021-05-12 (×5): 500 ug via ORAL
  Filled 2021-05-10 (×5): qty 1

## 2021-05-10 MED ORDER — POTASSIUM GLUCONATE 595 (99 K) MG PO TABS: 595.0000 mg | ORAL_TABLET | Freq: Every day | ORAL | Status: DC

## 2021-05-10 MED ORDER — METOPROLOL SUCCINATE ER 25 MG PO TB24
12.5000 mg | ORAL_TABLET | Freq: Every day | ORAL | Status: DC
  Administered 2021-05-11 – 2021-05-13 (×3): 12.5 mg via ORAL
  Filled 2021-05-10 (×3): qty 1

## 2021-05-10 MED ORDER — DILTIAZEM HCL 60 MG PO TABS: 30.0000 mg | ORAL_TABLET | ORAL | Status: DC | PRN

## 2021-05-10 MED ORDER — APIXABAN 5 MG PO TABS
5.0000 mg | ORAL_TABLET | Freq: Two times a day (BID) | ORAL | Status: DC
  Administered 2021-05-10 – 2021-05-13 (×6): 5 mg via ORAL
  Filled 2021-05-10 (×6): qty 1

## 2021-05-10 MED ORDER — LEVOTHYROXINE SODIUM 75 MCG PO TABS
75.0000 ug | ORAL_TABLET | Freq: Every day | ORAL | Status: DC
  Administered 2021-05-11 – 2021-05-13 (×3): 75 ug via ORAL
  Filled 2021-05-10 (×3): qty 1

## 2021-05-10 MED ORDER — DOCUSATE SODIUM 100 MG PO CAPS
100.0000 mg | ORAL_CAPSULE | Freq: Every day | ORAL | Status: DC
  Administered 2021-05-10 – 2021-05-12 (×3): 100 mg via ORAL
  Filled 2021-05-10 (×3): qty 1

## 2021-05-10 MED ORDER — SODIUM CHLORIDE 0.9% FLUSH: 3.0000 mL | INTRAVENOUS | Status: DC | PRN

## 2021-05-10 MED ORDER — ATORVASTATIN CALCIUM 10 MG PO TABS
10.0000 mg | ORAL_TABLET | Freq: Every day | ORAL | Status: DC
  Administered 2021-05-11 – 2021-05-13 (×3): 10 mg via ORAL
  Filled 2021-05-10 (×3): qty 1

## 2021-05-10 NOTE — H&P (Addendum)
Electrophysiology H&P  Note    Primary Care Physician: Isaac Bliss, Rayford Halsted, MD Primary Cardiologist: Dr Phineas Inches Primary Electrophysiologist: none Referring Physician: Dr Phineas Inches   Brandi Spence is a 76 y.o. female with a history of HTN, hypothyroid, HLD, atrial fibrillation who presents for follow up in the [facility]. The patient was initially diagnosed with atrial fibrillation 11/04/20 at a visit with her PCP although she was not told she had afib at that time. She was unaware of her arrhythmia with no symptoms. She was scheduled for total knee replacement on 01/17/21 but this was cancelled due to patient being in afib. Patient is on Eliquis for a CHADS2VASC score of 4. Seen by Dr Harl Bowie on 01/18/21 and started on BB. Patient is s/p DCCV on 02/08/21. Unfortunately, she was back in afib at follow up. She purchased a Kardia mobile device on 02/11/21 which showed afib at that time. Patient is s/p DCCV on 03/28/21. Her BB was stopped and her flecainide was decreased due to bradycardia. She did feel better in SR with less dyspnea on exertion.   On follow up today, patient presents for dofetilide admission. She denies any missed doses of anticoagulation in the past 3 weeks. She is now off trazodone.     Today, she denies symptoms of palpitations, chest pain, shortness of breath, orthopnea, PND, lower extremity edema, dizziness, presyncope, syncope, snoring, daytime somnolence, bleeding, or neurologic sequela. The patient is tolerating medications without difficulties and is otherwise without complaint today.    Atrial Fibrillation Risk Factors:  she does not have symptoms or diagnosis of sleep apnea. she does not have a history of rheumatic fever. she does not have a history of alcohol use. The patient does not have a history of early familial atrial fibrillation or other arrhythmias.  she has a BMI of Body mass index is 31.93 kg/m.Marland Kitchen   Family  History  Problem Relation Age of Onset   Colon cancer Father 58   Colon polyps Father    Esophageal cancer Neg Hx    Rectal cancer Neg Hx    Stomach cancer Neg Hx      Atrial Fibrillation Management history:  Previous antiarrhythmic drugs: flecainide  Previous cardioversions: 02/08/21, 03/28/21 Previous ablations: none CHADS2VASC score: 4 Anticoagulation history: Eliquis   Past Medical History:  Diagnosis Date   Arthritis    History of kidney stones    Hyperlipidemia    Hypertension    Hypothyroidism    Neuropathy    feet   Pre-diabetes    Primary localized osteoarthritis of right knee 01/05/2021   Past Surgical History:  Procedure Laterality Date   ABDOMINAL HYSTERECTOMY  2005   BREAST ENHANCEMENT SURGERY Bilateral 2000   BUNIONECTOMY Right 2013   CARDIOVERSION N/A 02/08/2021   Procedure: CARDIOVERSION;  Surgeon: Donato Heinz, MD;  Location: [facility] ENDOSCOPY;  Service: Cardiovascular;  Laterality: N/A;   CARDIOVERSION N/A 03/28/2021   Procedure: CARDIOVERSION;  Surgeon: Jerline Pain, MD;  Location: [place] ENDOSCOPY;  Service: Cardiovascular;  Laterality: N/A;   CARPAL TUNNEL RELEASE Right 1997   COLONOSCOPY  01/01/2013   EXCISION MORTON'S NEUROMA Bilateral 1980   right foot, 2004   KNEE ARTHROSCOPY Right 2006   2012   PAROTID GLAND TUMOR EXCISION  2009   POLYPECTOMY     SPINAL CORD STIMULATOR IMPLANT Left 2012   TARSAL TUNNEL RELEASE Right 2005   TONSILLECTOMY      No current facility-administered medications for this encounter.  Facility-Administered Medications Ordered in Other Encounters  Medication Dose Route Frequency Provider Last Rate Last Admin   fentaNYL citrate (PF) (SUBLIMAZE) injection   Intravenous Anesthesia Intra-op Eulas Post, April W, CRNA   100 mcg at 01/17/21 0707    Allergies  Allergen Reactions   Prednisone Other (See Comments)    Could not sleep and agitated    Social History   Socioeconomic History   Marital status: Divorced     Spouse name: Not on file   Number of children: Not on file   Years of education: Not on file   Highest education level: Not on file  Occupational History   Not on file  Tobacco Use   Smoking status: Former    Packs/day: 1.00    Years: 5.00    Pack years: 5.00    Types: Cigarettes    Quit date: 06/26/1980    Years since quitting: 40.8   Smokeless tobacco: Never   Tobacco comments:    Former smoker 03/02/21  Vaping Use   Vaping Use: Never used  Substance and Sexual Activity   Alcohol use: Yes    Alcohol/week: 1.0 standard drink    Types: 1 Standard drinks or equivalent per week    Comment: once a month 03/02/21   Drug use: No   Sexual activity: Not on file  Other Topics Concern   Not on file  Social History Narrative   Not on file   Social Determinants of Health   Financial Resource Strain: Not on file  Food Insecurity: Not on file  Transportation Needs: Not on file  Physical Activity: Not on file  Stress: Not on file  Social Connections: Not on file  Intimate Partner Violence: Not on file     ROS- All systems are reviewed and negative except as per the HPI above.  Physical Exam: Physical Exam:    Vitals:    05/10/21 1123  BP: (!) 146/90  Pulse: 96  Weight: 89.7 kg  Height: '5\' 6"'$  (1.676 m)     GEN- The patient is a well appearing obese elderly female, alert and oriented x 3 today.   HEENT-head normocephalic, atraumatic, sclera clear, conjunctiva pink, hearing intact, trachea midline. Lungs- Clear to ausculation bilaterally, normal work of breathing Heart- irregular rate and rhythm, no murmurs, rubs or gallops  GI- soft, NT, ND, + BS Extremities- no clubbing, cyanosis, or edema MS- no significant deformity or atrophy Skin- no rash or lesion Psych- euthymic mood, full affect Neuro- strength and sensation are intact   Wt Readings from Last 3 Encounters:  05/10/21 89.7 kg  04/13/21 88.7 kg  04/04/21 89 kg    EKG today demonstrates  Afib Vent.  rate 96 BPM PR interval * ms QRS duration 68 ms QT/QTcB 328/414 ms  Echo 02/01/21 demonstrated  1. Left ventricular ejection fraction, by estimation, is 55 to 60%. The  left ventricle has normal function. The left ventricle has no regional  wall motion abnormalities. Left ventricular diastolic function could not  be evaluated.   2. Right ventricular systolic function is low normal. The right  ventricular size is normal. There is moderately elevated pulmonary artery systolic pressure. The estimated right ventricular systolic pressure is 19.5 mmHg.   3. Left atrial size was moderately dilated.   4. The mitral valve is grossly normal. Mild to moderate mitral valve  regurgitation.   5. The aortic valve is tricuspid. Aortic valve regurgitation is not  visualized.   6. The inferior vena cava is  dilated in size with <50% respiratory  variability, suggesting right atrial pressure of 15 mmHg.   Comparison(s): No prior Echocardiogram.   Epic records are reviewed at length today  CHA2DS2-VASc Score = 4  The patient's score is based upon: CHF History: 0 HTN History: 1 Diabetes History: 0 Stroke History: 0 Vascular Disease History: 0 Age Score: 2 Gender Score: 1       ASSESSMENT AND PLAN: 1. Persistent Atrial Fibrillation (ICD10:  I48.19) The patient's CHA2DS2-VASc score is 4, indicating a 4.8% annual risk of stroke.   S/p DCCV 03/28/21 with early return of afib again. Patient presents for dofetilide admission.  Continue Eliquis 5 mg BID, states no missed doses in the last 3 weeks. No recent benadryl use PharmD has screened medications, trazodone discontinued.  QTc in SR 420 ms Labs today show creatinine at 0.83, K+ 3.6 and mag 2.0, CrCl calculated at 81 mL/min Continue metoprolol 12.5 mg daily Change diltiazem 30 mg to PRN q 4 hours for heart racing.  Kardia for home monitoring.  If she fails dofetilide, she is agreeable to discussing ablation.   2. Secondary Hypercoagulable  State (ICD10:  D68.69) The patient is at significant risk for stroke/thromboembolism based upon her CHA2DS2-VASc Score of 4.  Continue Apixaban (Eliquis).   3. Obesity There is no height or weight on file to calculate BMI. Lifestyle modification was discussed and encouraged including regular physical activity and weight reduction.  4. HTN Stable, no changes today.  5. Valvular heart disease Mild to moderate MR   She presents today for tikosyn admission as planned.   Legrand Como [address]" [place], PA-C  05/10/2021 2:24 PM   EP Attending  Patient seen and examined. Agree with above. The patient presents for initiation of dofetilide. She is in atrial fib with a RVR. She does not have palpitations. She feels poorly when she exerts herself. On exam she is a pleasant woman, NAD. 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 Ext are without edema. Tele demonstrates atrial fib. She will be started on dofetilide. We will follow her QT and electrolytes. DCCV on Thursday if she has not reverted to NSR.   Carleene Overlie Makila Colombe,MD

## 2021-05-10 NOTE — Progress Notes (Signed)
Pharmacy: Dofetilide (Tikosyn) - Follow Up ?Assessment and Electrolyte Replacement ? ?Pharmacy consulted to assist in monitoring and replacing electrolytes in this 76 y.o. female admitted on 05/10/2021 undergoing dofetilide initiation. First dofetilide dose: '500mg'$  BID - 2200 3/7 ? ?Labs: ?   ?Component Value Date/Time  ? K 4.2 05/10/2021 2030  ? MG 2.0 05/10/2021 1141  ?  ? ?Plan: ?Potassium: ?K >/= 4: No additional supplementation needed ? ?Magnesium: ?Mg > 2: No additional supplementation needed ? ? ?As patient has required on average n/a mEq of potassium replacement every day, recommend discharging patient with prescription for: ? ?Onnie Boer, PharmD, BCIDP, AAHIVP, CPP ?Infectious Disease Pharmacist ?05/10/2021 9:24 PM ? ? ?

## 2021-05-10 NOTE — Progress Notes (Signed)
Primary Care Physician: Isaac Bliss, Rayford Halsted, MD Primary Cardiologist: Dr Phineas Inches Primary Electrophysiologist: none Referring Physician: Dr Phineas Inches   Brandi Spence is a 76 y.o. female with a history of HTN, hypothyroid, HLD, atrial fibrillation who presents for follow up in the [facility]. The patient was initially diagnosed with atrial fibrillation 11/04/20 at a visit with her PCP although she was not told she had afib at that time. She was unaware of her arrhythmia with no symptoms. She was scheduled for total knee replacement on 01/17/21 but this was cancelled due to patient being in afib. Patient is on Eliquis for a CHADS2VASC score of 4. Seen by Dr Harl Bowie on 01/18/21 and started on BB. Patient is s/p DCCV on 02/08/21. Unfortunately, she was back in afib at follow up. She purchased a Kardia mobile device on 02/11/21 which showed afib at that time. Patient is s/p DCCV on 03/28/21. Her BB was stopped and her flecainide was decreased due to bradycardia. She did feel better in SR with less dyspnea on exertion.   On follow up today, patient presents for dofetilide admission. She denies any missed doses of anticoagulation in the past 3 weeks. She is now off trazodone.     Today, she denies symptoms of palpitations, chest pain, shortness of breath, orthopnea, PND, lower extremity edema, dizziness, presyncope, syncope, snoring, daytime somnolence, bleeding, or neurologic sequela. The patient is tolerating medications without difficulties and is otherwise without complaint today.    Atrial Fibrillation Risk Factors:  she does not have symptoms or diagnosis of sleep apnea. she does not have a history of rheumatic fever. she does not have a history of alcohol use. The patient does not have a history of early familial atrial fibrillation or other arrhythmias.  she has a BMI of Body mass index is 31.93 kg/m.Marland Kitchen Filed Weights   05/10/21 1123  Weight: 89.7 kg       Family History  Problem Relation Age of Onset   Colon cancer Father 37   Colon polyps Father    Esophageal cancer Neg Hx    Rectal cancer Neg Hx    Stomach cancer Neg Hx      Atrial Fibrillation Management history:  Previous antiarrhythmic drugs: flecainide  Previous cardioversions: 02/08/21, 03/28/21 Previous ablations: none CHADS2VASC score: 4 Anticoagulation history: Eliquis   Past Medical History:  Diagnosis Date   Arthritis    History of kidney stones    Hyperlipidemia    Hypertension    Hypothyroidism    Neuropathy    feet   Pre-diabetes    Primary localized osteoarthritis of right knee 01/05/2021   Past Surgical History:  Procedure Laterality Date   ABDOMINAL HYSTERECTOMY  2005   BREAST ENHANCEMENT SURGERY Bilateral 2000   BUNIONECTOMY Right 2013   CARDIOVERSION N/A 02/08/2021   Procedure: CARDIOVERSION;  Surgeon: Donato Heinz, MD;  Location: [facility] ENDOSCOPY;  Service: Cardiovascular;  Laterality: N/A;   CARDIOVERSION N/A 03/28/2021   Procedure: CARDIOVERSION;  Surgeon: Jerline Pain, MD;  Location: [facility] ENDOSCOPY;  Service: Cardiovascular;  Laterality: N/A;   CARPAL TUNNEL RELEASE Right 1997   COLONOSCOPY  01/01/2013   EXCISION MORTON'S NEUROMA Bilateral 1980   right foot, 2004   KNEE ARTHROSCOPY Right 2006   2012   PAROTID GLAND TUMOR EXCISION  2009   POLYPECTOMY     SPINAL CORD STIMULATOR IMPLANT Left 2012   TARSAL TUNNEL RELEASE Right 2005   TONSILLECTOMY  Current Outpatient Medications  Medication Sig Dispense Refill   atorvastatin (LIPITOR) 10 MG tablet Take 1 tablet (10 mg total) by mouth daily. 30 tablet 3   cetirizine (ZYRTEC) 10 MG tablet Take 10 mg by mouth at bedtime.     diltiazem (CARDIZEM) 30 MG tablet Take 1 tablet every 4 hours AS NEEDED for heart rate >100 60 tablet 3   docusate sodium (COLACE) 100 MG capsule Take 100 mg by mouth at bedtime.     ELIQUIS 5 MG TABS tablet Take 1 tablet (5 mg total) by mouth 2 (two)  times daily. 180 tablet 1   FIBER PO Take 1 tablet by mouth at bedtime.     furosemide (LASIX) 20 MG tablet Taking one tablet by mouth as needed for swelling 30 tablet 0   levothyroxine (SYNTHROID, LEVOTHROID) 75 MCG tablet Take 75 mcg by mouth daily before breakfast.     metoprolol succinate (TOPROL-XL) 25 MG 24 hr tablet Take 12.5 mg by mouth daily.     OVER THE COUNTER MEDICATION Take 2 tablets by mouth daily. BondiBoost Hair Growth Supplement     potassium gluconate 595 (99 K) MG TABS tablet Take 595 mg by mouth daily.     pregabalin (LYRICA) 200 MG capsule Take 200 mg by mouth in the morning and at bedtime.     No current facility-administered medications for this encounter.   Facility-Administered Medications Ordered in Other Encounters  Medication Dose Route Frequency Provider Last Rate Last Admin   fentaNYL citrate (PF) (SUBLIMAZE) injection   Intravenous Anesthesia Intra-op Eulas Post, April W, CRNA   100 mcg at 01/17/21 0707    Allergies  Allergen Reactions   Prednisone Other (See Comments)    Could not sleep and agitated    Social History   Socioeconomic History   Marital status: Divorced    Spouse name: Not on file   Number of children: Not on file   Years of education: Not on file   Highest education level: Not on file  Occupational History   Not on file  Tobacco Use   Smoking status: Former    Packs/day: 1.00    Years: 5.00    Pack years: 5.00    Types: Cigarettes    Quit date: 06/26/1980    Years since quitting: 40.8   Smokeless tobacco: Never   Tobacco comments:    Former smoker 03/02/21  Vaping Use   Vaping Use: Never used  Substance and Sexual Activity   Alcohol use: Yes    Alcohol/week: 1.0 standard drink    Types: 1 Standard drinks or equivalent per week    Comment: once a month 03/02/21   Drug use: No   Sexual activity: Not on file  Other Topics Concern   Not on file  Social History Narrative   Not on file   Social Determinants of Health    Financial Resource Strain: Not on file  Food Insecurity: Not on file  Transportation Needs: Not on file  Physical Activity: Not on file  Stress: Not on file  Social Connections: Not on file  Intimate Partner Violence: Not on file     ROS- All systems are reviewed and negative except as per the HPI above.  Physical Exam: Vitals:   05/10/21 1123  BP: (!) 146/90  Pulse: 96  Weight: 89.7 kg  Height: '5\' 6"'$  (1.676 m)    GEN- The patient is a well appearing obese elderly female, alert and oriented x 3 today.  HEENT-head normocephalic, atraumatic, sclera clear, conjunctiva pink, hearing intact, trachea midline. Lungs- Clear to ausculation bilaterally, normal work of breathing Heart- irregular rate and rhythm, no murmurs, rubs or gallops  GI- soft, NT, ND, + BS Extremities- no clubbing, cyanosis, or edema MS- no significant deformity or atrophy Skin- no rash or lesion Psych- euthymic mood, full affect Neuro- strength and sensation are intact   Wt Readings from Last 3 Encounters:  05/10/21 89.7 kg  04/13/21 88.7 kg  04/04/21 89 kg    EKG today demonstrates  Afib Vent. rate 96 BPM PR interval * ms QRS duration 68 ms QT/QTcB 328/414 ms  Echo 02/01/21 demonstrated  1. Left ventricular ejection fraction, by estimation, is 55 to 60%. The  left ventricle has normal function. The left ventricle has no regional  wall motion abnormalities. Left ventricular diastolic function could not  be evaluated.   2. Right ventricular systolic function is low normal. The right  ventricular size is normal. There is moderately elevated pulmonary artery systolic pressure. The estimated right ventricular systolic pressure is 75.4 mmHg.   3. Left atrial size was moderately dilated.   4. The mitral valve is grossly normal. Mild to moderate mitral valve  regurgitation.   5. The aortic valve is tricuspid. Aortic valve regurgitation is not  visualized.   6. The inferior vena cava is dilated in  size with <50% respiratory  variability, suggesting right atrial pressure of 15 mmHg.   Comparison(s): No prior Echocardiogram.   Epic records are reviewed at length today  CHA2DS2-VASc Score = 4  The patient's score is based upon: CHF History: 0 HTN History: 1 Diabetes History: 0 Stroke History: 0 Vascular Disease History: 0 Age Score: 2 Gender Score: 1       ASSESSMENT AND PLAN: 1. Persistent Atrial Fibrillation (ICD10:  I48.19) The patient's CHA2DS2-VASc score is 4, indicating a 4.8% annual risk of stroke.   S/p DCCV 03/28/21 with early return of afib again. Patient presents for dofetilide admission.  Continue Eliquis 5 mg BID, states no missed doses in the last 3 weeks. No recent benadryl use PharmD has screened medications, trazodone discontinued.  QTc in SR 420 ms Labs today show creatinine at 0.83, K+ 3.6 and mag 2.0, CrCl calculated at 81 mL/min Continue metoprolol 12.5 mg daily Change diltiazem 30 mg to PRN q 4 hours for heart racing.  Kardia for home monitoring.  If she fails dofetilide, she is agreeable to discussing ablation.   2. Secondary Hypercoagulable State (ICD10:  D68.69) The patient is at significant risk for stroke/thromboembolism based upon her CHA2DS2-VASc Score of 4.  Continue Apixaban (Eliquis).   3. Obesity Body mass index is 31.93 kg/m. Lifestyle modification was discussed and encouraged including regular physical activity and weight reduction.  4. HTN Stable, no changes today.  5. 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    [facility] [address] [place], [place] [address] [phone] 05/10/2021 11:53 AM

## 2021-05-10 NOTE — Progress Notes (Signed)
Pharmacy: Dofetilide (Tikosyn) - Initial Consult ?Assessment and Electrolyte Replacement ? ?Pharmacy consulted to assist in monitoring and replacing electrolytes in this 76 y.o. female admitted on 05/10/2021 undergoing dofetilide initiation. First dofetilide dose: pending labs ? ?Assessment: ? ?Patient Exclusion Criteria: If any screening criteria checked as "Yes", then  patient  should NOT receive dofetilide until criteria item is corrected.  ?If ?Yes? please indicate correction plan. ? ?YES  NO Patient  Exclusion Criteria Correction Plan  ? ?'[]'$   ?'[x]'$   ?Baseline QTc interval is greater than or equal to 440 msec. ?IF above YES box checked dofetilide contraindicated unless patient has ICD; then may proceed if QTc 500-550 msec or with known ventricular conduction abnormalities may proceed with QTc 550-600 msec. ?QTc =  414   ? ?'[]'$   ?'[x]'$   ?Patient is known or suspected to have a digoxin level greater than 2 ng/ml: ?No results found for: DIGOXIN ?   ? ?'[]'$   ?'[x]'$   ?Creatinine clearance less than 20 ml/min (calculated using Cockcroft-Gault, actual body weight and serum creatinine): ?Estimated Creatinine Clearance: 65.1 mL/min (by C-G formula based on SCr of 0.83 mg/dL). ?   ? ?'[]'$   ?'[x]'$  Patient has received drugs known to prolong the QT intervals within the last 48 hours (phenothiazines, tricyclics or tetracyclic antidepressants, erythromycin, H-1 antihistamines, cisapride, fluoroquinolones, azithromycin). Drugs not listed above may have an, as yet, undetected potential to prolong the QT interval, updated information on QT prolonging agents is available at this website:QT prolonging agents or www.crediblemeds.org   ? ?'[]'$   ?'[x]'$   ?Patient received a dose of hydrochlorothiazide (Oretic) alone or in any combination including triamterene (Dyazide, Maxzide) in the last 48 hours.   ? ?'[]'$   ?'[x]'$  Patient received a medication known to increase dofetilide plasma concentrations prior to initial dofetilide dose:  ?Trimethoprim (Primsol,  Proloprim) in the last 36 hours ?Verapamil (Calan, Verelan) in the last 36 hours or a sustained release dose in the last 72 hours ?Megestrol (Megace) in the last 5 days  ?Cimetidine (Tagamet) in the last 6 hours ?Ketoconazole (Nizoral) in the last 24 hours ?Itraconazole (Sporanox) in the last 48 hours  ?Prochlorperazine (Compazine) in the last 36 hours ?   ? ?'[]'$   ?'[x]'$   ?Patient is known to have a history of torsades de pointes; congenital or acquired long QT syndromes.   ? ?'[]'$   ?'[x]'$   ?Patient has received a Class 1 antiarrhythmic with less than 2 half-lives since last dose. ?(Disopyramide, Quinidine, Procainamide, Lidocaine, Mexiletine, Flecainide, Propafenone)   ? ?'[]'$   ?'[x]'$   ?Patient has received amiodarone therapy in the past 3 months or amiodarone level is greater than 0.3 ng/ml.   ? ?Patient has been appropriately anticoagulated with apixaban '5mg'$  BID. ? ?Labs: ?   ?Component Value Date/Time  ? K 3.6 05/10/2021 1141  ? MG 2.0 05/10/2021 1141  ?  ? ?Plan: ?Potassium: ?K 3.5-3.7:  Hold Tikosyn initiation and give KCl 60 mEq po x1 and repeat BMET 2hr after dose - repeat appropriate dose if K < 4   ? ?Magnesium: ?Mg 1.8-2: Give Mg 2 gm IV x1 to prevent Mg from dropping below 1.8 - do not need to recheck Mg. Appropriate to initiate Tikosyn ? ? ?Onnie Boer, PharmD, BCIDP, AAHIVP, CPP ?Infectious Disease Pharmacist ?05/10/2021 3:39 PM ? ? ? ?

## 2021-05-11 ENCOUNTER — Other Ambulatory Visit (HOSPITAL_COMMUNITY): Payer: Self-pay

## 2021-05-11 LAB — BASIC METABOLIC PANEL
Anion gap: 7 (ref 5–15)
BUN: 13 mg/dL (ref 8–23)
CO2: 28 mmol/L (ref 22–32)
Calcium: 8.9 mg/dL (ref 8.9–10.3)
Chloride: 105 mmol/L (ref 98–111)
Creatinine, Ser: 0.93 mg/dL (ref 0.44–1.00)
GFR, Estimated: >60 mL/min (ref >60)
Glucose, Bld: 101 mg/dL — ABNORMAL HIGH (ref 70–99)
Potassium: 4.4 mmol/L (ref 3.5–5.1)
Sodium: 140 mmol/L (ref 135–145)

## 2021-05-11 LAB — MAGNESIUM: Magnesium: 2.4 mg/dL (ref 1.7–2.4)

## 2021-05-11 MED ORDER — MELATONIN 3 MG PO TABS
3.0000 mg | ORAL_TABLET | Freq: Every day | ORAL | Status: DC
  Administered 2021-05-11 – 2021-05-12 (×2): 3 mg via ORAL
  Filled 2021-05-11 (×2): qty 1

## 2021-05-11 MED ORDER — PREGABALIN 100 MG PO CAPS
200.0000 mg | ORAL_CAPSULE | Freq: Two times a day (BID) | ORAL | Status: DC
  Administered 2021-05-11 – 2021-05-13 (×5): 200 mg via ORAL
  Filled 2021-05-11 (×5): qty 2

## 2021-05-11 NOTE — Care Management (Signed)
05-11-21 1513 Case Manager spoke with patient regarding Tikosyn cost and she feels that $80.00 is too expensive. Patient states she looked at [facility] and wants to use them. Initial Rx to be filled via [facility] and patient would like a paper Rx for 90 day supply with refills for dofetilide to use [facility] for mail order via [facility]. No further needs identified at this time.  ?

## 2021-05-11 NOTE — Progress Notes (Signed)
Morning EKG reviewed   ? ?Shows pt remains in afib in 80s with stable QTc. ? ?Continue  Tikosyn 500 mcg BID.  ? ?Pt will be NPO after midnight for DCCV if remains in afib  ? ?Shirley Friar, PA-C  ?Pager: [phone]  ?05/11/2021 12:55 PM  ? ?

## 2021-05-11 NOTE — Progress Notes (Signed)
Pharmacy: Dofetilide (Tikosyn) - Follow Up ?Assessment and Electrolyte Replacement ? ?Pharmacy consulted to assist in monitoring and replacing electrolytes in this 76 y.o. female admitted on 05/10/2021 undergoing dofetilide initiation.  ? ?Labs: ?   ?Component Value Date/Time  ? K 4.4 05/11/2021 0532  ? MG 2.4 05/11/2021 0532  ?  ? ?Plan: ?Potassium: ?K >/= 4: No additional supplementation needed ? ?Magnesium: ?Mg > 2: No additional supplementation needed ? ? ?Thank you for allowing pharmacy to participate in this patient's care  ? ?Hildred Laser, PharmD ?Clinical Pharmacist ?**Pharmacist phone directory can now be found on [email or web address] ([email or web address]).  Listed under [place]. ? ? ?

## 2021-05-11 NOTE — Care Management (Signed)
05-11-21 Patient presented for Tikosyn Load. Benefits check submitted for cost. Case Manager will follow for cost and pharmacy of choice as the patient progresses.  ?

## 2021-05-11 NOTE — TOC Benefit Eligibility Note (Signed)
Patient Advocate Encounter ?  ?Insurance verification completed.   ?  ?The patient is currently admitted and upon discharge could be taking TIKOSYN. ?  ?The current 30 day co-pay is, $80.  ? ?The patient is insured through [employer]. ? ? ?  ? ?

## 2021-05-11 NOTE — Progress Notes (Addendum)
? ?Electrophysiology Rounding Note ? ?Patient Name: Brandi Spence ?Date of Encounter: 05/11/2021 ? ?Primary Cardiologist: Janina Mayo, MD  ?Electrophysiologist: None  ? ? ?Subjective  ? ?Pt remains in afib on Tikosyn 500 mcg BID  ? ?QTc from EKG last pm shows stable QTc at ~480 ? ?The patient is doing well today.  At this time, the patient denies chest pain, shortness of breath, or any new concerns. ? ?Inpatient Medications  ?  ?Scheduled Meds: ? apixaban  5 mg Oral BID  ? atorvastatin  10 mg Oral Daily  ? docusate sodium  100 mg Oral QHS  ? dofetilide  500 mcg Oral BID  ? levothyroxine  75 mcg Oral QAC breakfast  ? loratadine  10 mg Oral QHS  ? metoprolol succinate  12.5 mg Oral Daily  ? pregabalin  200 mg Oral QHS  ? sodium chloride flush  3 mL Intravenous Q12H  ? ?Continuous Infusions: ? sodium chloride    ? ?PRN Meds: ?sodium chloride, diltiazem, furosemide, sodium chloride flush  ? ?Vital Signs  ?  ?Vitals:  ? 05/10/21 1608 05/10/21 2005 05/11/21 0006 05/11/21 0534  ?BP: 123/70 118/80 114/77 (!) 146/96  ?Pulse: (!) 57 71 84 88  ?Resp: '20 15 15 14  '$ ?Temp: 98.6 ?F (37 ?C) 98.6 ?F (37 ?C) 98.6 ?F (37 ?C) 98.6 ?F (37 ?C)  ?TempSrc: Oral Oral Oral Oral  ?SpO2:  100% 100% 100%  ?Weight: 89.7 kg     ?Height: '5\' 6"'$  (1.676 m)     ? ? ?Intake/Output Summary (Last 24 hours) at 05/11/2021 3295 ?Last data filed at 05/10/2021 1812 ?Gross per 24 hour  ?Intake 28.99 ml  ?Output --  ?Net 28.99 ml  ? ?Filed Weights  ? 05/10/21 1608  ?Weight: 89.7 kg  ? ? ?Physical Exam  ?  ?GEN- The patient is well appearing, alert and oriented x 3 today.   ?Head- normocephalic, atraumatic ?Eyes-  Sclera clear, conjunctiva pink ?Ears- hearing intact ?Oropharynx- clear ?Neck- supple ?Lungs- Clear to ausculation bilaterally, normal work of breathing ?Heart- Irregularly irregular rate and rhythm, no murmurs, rubs or gallops ?GI- soft, NT, ND, + BS ?Extremities- no clubbing, cyanosis, or edema ?Skin- no rash or lesion ?Psych- euthymic mood, full  affect ?Neuro- strength and sensation are intact ? ?Labs  ?  ?CBC ?No results for input(s): WBC, NEUTROABS, HGB, HCT, MCV, PLT in the last 72 hours. ?Basic Metabolic Panel ?Recent Labs  ?  05/10/21 ?1141 05/10/21 ?2030 05/11/21 ?0532  ?NA 139 139 140  ?K 3.6 4.2 4.4  ?CL 103 103 105  ?CO2 '29 30 28  '$ ?GLUCOSE 82 115* 101*  ?BUN '18 18 13  '$ ?CREATININE 0.83 0.83 0.93  ?CALCIUM 8.8* 9.0 8.9  ?MG 2.0  --  2.4  ? ? ?Potassium  ?Date/Time Value Ref Range Status  ?05/11/2021 05:32 AM 4.4 3.5 - 5.1 mmol/L Final  ? ?Magnesium  ?Date/Time Value Ref Range Status  ?05/11/2021 05:32 AM 2.4 1.7 - 2.4 mg/dL Final  ?  Comment:  ?  Performed at [facility] [address], [place], [place] [address]  ? ? ?Telemetry  ?  ?AF 80-100s (personally reviewed) ? ?Radiology  ?  ?No results found. ? ? ?Patient Profile  ?   ?Brandi Spence is a 76 y.o. female with a past medical history significant for persistent atrial fibrillation.  They were admitted for tikosyn load.  ? ?Assessment & Plan  ?  ?Persistent atrial fibrillation ?Pt remains in  afib on Tikosyn 500 mcg BID  ?Continue Eliquis ?Electrolytes stable.  ?CHA2DS2VASC is at least 4. ?She is interested in ablation eventually, especially if fails tikosyn. ? ?HTN ?Stable on current regimen  ? ?3. Mild Mod MR ?Stable ? ?If pt does not convert chemically, plan on DCCV tomorrow  ? ? ?For questions or updates, please contact [facility] ?Please consult [email or web address] for contact info under Cardiology/STEMI. ? ?Signed, ?Shirley Friar, PA-C  ?05/11/2021, 7:22 AM  ? ?I have seen and examined this patient with Oda Kilts.  Agree with above, note added to reflect my findings.  And they are without major complaint.  Ready for cardioversion tomorrow. ? ?GEN: Well nourished, well developed, in no acute distress  ?HEENT: normal  ?Neck: no JVD, carotid bruits, or masses ?Cardiac: Irregular, tachycardic; no murmurs, rubs, or gallops,no edema  ?Respiratory:  clear to auscultation  bilaterally, normal work of breathing ?GI: soft, nontender, nondistended, + BS ?MS: no deformity or atrophy  ?Skin: warm and dry ?Neuro:  Strength and sensation are intact ?Psych: euthymic mood, full affect  ? ?1.  Persistent atrial fibrillation: QTc is remained stable.  Continue her current dose of dofetilide.  Currently on Eliquis.  If she does not convert to sinus rhythm today, we Loveah Like plan for cardioversion tomorrow. ? ?Eulogio Requena M. Branch Pacitti MD ?05/11/2021 ?8:25 AM  ?

## 2021-05-12 ENCOUNTER — Encounter (HOSPITAL_COMMUNITY)
Admission: AD | Disposition: A | Discharge status: Home / Self Care | Payer: Self-pay | Source: Ambulatory Visit | Attending: Internal Medicine

## 2021-05-12 ENCOUNTER — Inpatient Hospital Stay (HOSPITAL_COMMUNITY): Payer: Medicare Other | Admitting: Anesthesiology

## 2021-05-12 ENCOUNTER — Encounter (HOSPITAL_COMMUNITY): Payer: Self-pay | Admitting: Internal Medicine

## 2021-05-12 DIAGNOSIS — M1990 Unspecified osteoarthritis, unspecified site: Secondary | ICD-10-CM

## 2021-05-12 DIAGNOSIS — I10 Essential (primary) hypertension: Secondary | ICD-10-CM

## 2021-05-12 DIAGNOSIS — E039 Hypothyroidism, unspecified: Secondary | ICD-10-CM

## 2021-05-12 DIAGNOSIS — I4891 Unspecified atrial fibrillation: Secondary | ICD-10-CM

## 2021-05-12 HISTORY — PX: CARDIOVERSION: SHX1299

## 2021-05-12 LAB — MAGNESIUM: Magnesium: 2.2 mg/dL (ref 1.7–2.4)

## 2021-05-12 LAB — BASIC METABOLIC PANEL
Anion gap: 7 (ref 5–15)
BUN: 17 mg/dL (ref 8–23)
CO2: 26 mmol/L (ref 22–32)
Calcium: 9.1 mg/dL (ref 8.9–10.3)
Chloride: 106 mmol/L (ref 98–111)
Creatinine, Ser: 0.91 mg/dL (ref 0.44–1.00)
GFR, Estimated: >60 mL/min (ref >60)
Glucose, Bld: 101 mg/dL — ABNORMAL HIGH (ref 70–99)
Potassium: 4.3 mmol/L (ref 3.5–5.1)
Sodium: 139 mmol/L (ref 135–145)

## 2021-05-12 LAB — PROTIME-INR
INR: 1.2 (ref 0.8–1.2)
Prothrombin Time: 15.5 seconds — ABNORMAL HIGH (ref 11.4–15.2)

## 2021-05-12 SURGERY — CARDIOVERSION
Anesthesia: General

## 2021-05-12 MED ORDER — ACETAMINOPHEN 325 MG PO TABS
650.0000 mg | ORAL_TABLET | Freq: Four times a day (QID) | ORAL | Status: DC | PRN
  Administered 2021-05-12 – 2021-05-13 (×2): 650 mg via ORAL
  Filled 2021-05-12 (×2): qty 2

## 2021-05-12 MED ORDER — LACTATED RINGERS IV SOLN: INTRAVENOUS | Status: DC | PRN

## 2021-05-12 MED ORDER — SODIUM CHLORIDE 0.9 % IV SOLN: INTRAVENOUS | Status: DC

## 2021-05-12 MED ORDER — LIDOCAINE 2% (20 MG/ML) 5 ML SYRINGE
INTRAMUSCULAR | Status: DC | PRN
Start: 2021-05-12 — End: 2021-05-12
  Administered 2021-05-12: 40 mg via INTRAVENOUS

## 2021-05-12 MED ORDER — PROPOFOL 10 MG/ML IV BOLUS
INTRAVENOUS | Status: DC | PRN
  Administered 2021-05-12: 50 mg via INTRAVENOUS

## 2021-05-12 NOTE — Progress Notes (Signed)
Morning EKG reviewed   ? ?Shows is in NSR s/p [place] at 49 bpm with stable QTc when corrected at ~480 ms. ? ?Continue  Tikosyn 500 mcg BID.  ? ?Plan for home tomorrow if QTc remains stable.  ? ?Shirley Friar, PA-C  ?Pager: [phone]  ?05/12/2021 12:53 PM  ? ?

## 2021-05-12 NOTE — Op Note (Signed)
Procedure: Electrical Cardioversion ?Indications:  Atrial Fibrillation ? ?Procedure Details: ? ?Consent: Risks of procedure as well as the alternatives and risks of each were explained to the (patient/caregiver).  Consent for procedure obtained. ? ?Time Out: Verified patient identification, verified procedure, site/side was marked, verified correct patient position, special equipment/implants available, medications/allergies/relevent history reviewed, required imaging and test results available.  Performed ? ?Patient placed on cardiac monitor, pulse oximetry, supplemental oxygen as necessary.  ?Sedation given:  propofol 60 mg IV, Dr. Lanetta Inch ?Pacer pads placed anterior and posterior chest. ? ?Cardioverted 1 time(s).  ?Cardioversion with synchronized biphasic 120J shock. ? ?Evaluation: ?Findings: Post procedure EKG shows: NSR ?Complications: None ?Patient did tolerate procedure well. ? ?Time Spent Directly with the Patient: ? ?30 minutes  ? ?Brandi Spence ?05/12/2021, 12:43 PM ? ? ? ? ?

## 2021-05-12 NOTE — Progress Notes (Signed)
Pharmacy: Dofetilide (Tikosyn) - Follow Up ?Assessment and Electrolyte Replacement ? ?Pharmacy consulted to assist in monitoring and replacing electrolytes in this 76 y.o. female admitted on 05/10/2021 undergoing dofetilide initiation.  ? ?Labs: ?   ?Component Value Date/Time  ? K 4.3 05/12/2021 0254  ? MG 2.2 05/12/2021 0254  ?  ? ?Plan: ?Potassium: ?K >/= 4: No additional supplementation needed ? ?Magnesium: ?Mg > 2: No additional supplementation needed ? ? ?I anticipate no need for oral potassium replacement at discharge.  ? ?Thank you for allowing pharmacy to participate in this patient's care  ? ? ?Hildred Laser, PharmD ?Clinical Pharmacist ?**Pharmacist phone directory can now be found on [email or web address] ([email or web address]).  Listed under [place]. ? ? ? ?

## 2021-05-12 NOTE — Progress Notes (Signed)
Patient being picked up by transport for DCCV when post tikosyn EKG was due.  Spoke to [employer], Therapist, sports, stated that she would ask Dr. Sallyanne Kuster if one needs to be done prior to [place] or after. ?

## 2021-05-12 NOTE — H&P (View-Only) (Signed)
? ?Electrophysiology Rounding Note ? ?Patient Name: Brandi Spence ?Date of Encounter: 05/12/2021 ? ?Primary Cardiologist: Janina Mayo, MD  ?Electrophysiologist: Dr. Curt Bears ? ? ?Subjective  ? ?Pt remains in afib on Tikosyn 500 mcg BID  ? ?QTc from EKG last pm shows stable QTc at ~480 ? ?The patient is doing well today.  At this time, the patient denies chest pain, shortness of breath, or any new concerns. ? ?Inpatient Medications  ?  ?Scheduled Meds: ? apixaban  5 mg Oral BID  ? atorvastatin  10 mg Oral Daily  ? docusate sodium  100 mg Oral QHS  ? dofetilide  500 mcg Oral BID  ? levothyroxine  75 mcg Oral QAC breakfast  ? loratadine  10 mg Oral QHS  ? melatonin  3 mg Oral QHS  ? metoprolol succinate  12.5 mg Oral Daily  ? pregabalin  200 mg Oral BID  ? sodium chloride flush  3 mL Intravenous Q12H  ? ?Continuous Infusions: ? sodium chloride    ? sodium chloride    ? ?PRN Meds: ?sodium chloride, diltiazem, furosemide, sodium chloride flush  ? ?Vital Signs  ?  ?Vitals:  ? 05/11/21 1251 05/11/21 2027 05/11/21 2038 05/12/21 0601  ?BP: 121/87 (!) 136/98  125/73  ?Pulse: 90 70  80  ?Resp: '16 20 15 20  '$ ?Temp: 97.7 ?F (36.5 ?C) 97.9 ?F (36.6 ?C)  98 ?F (36.7 ?C)  ?TempSrc: Oral Oral  Oral  ?SpO2: 100% 95%    ?Weight:      ?Height:      ? ? ?Intake/Output Summary (Last 24 hours) at 05/12/2021 0702 ?Last data filed at 05/11/2021 [phone] ?Gross per 24 hour  ?Intake 360 ml  ?Output --  ?Net 360 ml  ? ?Filed Weights  ? 05/10/21 1608  ?Weight: 89.7 kg  ? ? ?Physical Exam  ?  ?GEN- The patient is well appearing, alert and oriented x 3 today.   ?Head- normocephalic, atraumatic ?Eyes-  Sclera clear, conjunctiva pink ?Ears- hearing intact ?Oropharynx- clear ?Neck- supple ?Lungs- Clear to ausculation bilaterally, normal work of breathing ?Heart-  Somewhat tachy, and irregularly irregular  rate and rhythm, no murmurs, rubs or gallops ?GI- soft, NT, ND, + BS ?Extremities- no clubbing, cyanosis, or edema ?Skin- no rash or lesion ?Psych-  euthymic mood, full affect ?Neuro- strength and sensation are intact ? ?Labs  ?  ?CBC ?No results for input(s): WBC, NEUTROABS, HGB, HCT, MCV, PLT in the last 72 hours. ?Basic Metabolic Panel ?Recent Labs  ?  05/11/21 ?0532 05/12/21 ?0254  ?NA 140 139  ?K 4.4 4.3  ?CL 105 106  ?CO2 28 26  ?GLUCOSE 101* 101*  ?BUN 13 17  ?CREATININE 0.93 0.91  ?CALCIUM 8.9 9.1  ?MG 2.4 2.2  ? ? ?Potassium  ?Date/Time Value Ref Range Status  ?05/12/2021 02:54 AM 4.3 3.5 - 5.1 mmol/L Final  ? ?Magnesium  ?Date/Time Value Ref Range Status  ?05/12/2021 02:54 AM 2.2 1.7 - 2.4 mg/dL Final  ?  Comment:  ?  Performed at [facility] [address] [place], [place] [address]  ? ? ?Telemetry  ?  ?AF/AF-RVR 90-120s (personally reviewed) ? ?Radiology  ?  ?No results found. ? ? ?Patient Profile  ?   ?Brandi Spence is a 76 y.o. female with a past medical history significant for persistent atrial fibrillation.  They were admitted for tikosyn load.  ? ?Assessment & Plan  ?  ?Persistent atrial fibrillation ?Pt remains in afib on  Tikosyn 500 mcg BID  ?Continue Eliquis ?Electrolytes stable.  ?CHA2DS2VASC is at least 4.  ? ? ?For [facility] this afternoon if does not convert chemically.  ? ? ?For questions or updates, please contact [place] ?Please consult [email or web address] for contact info under Cardiology/STEMI. ? ?Signed, ?Shirley Friar, PA-C  ?05/12/2021, 7:02 AM  ? ?I have seen and examined this patient with Oda Kilts.  Agree with above, note added to reflect my findings.  Remains in atrial fibrillation.  Ready for cardioversion later today. ? ?GEN: Well nourished, well developed, in no acute distress  ?HEENT: normal  ?Neck: no JVD, carotid bruits, or masses ?Cardiac: Irregular ; no murmurs, rubs, or gallops,no edema  ?Respiratory:  clear to auscultation bilaterally, normal work of breathing ?GI: soft, nontender, nondistended, + BS ?MS: no deformity or atrophy  ?Skin: warm and dry ?Neuro:  Strength and sensation are intact ?Psych:  euthymic mood, full affect  ? ?Persistent atrial fibrillation: Currently on Eliquis 5 mg twice daily, dofetilide 500 mcg twice daily.  She remains in atrial fibrillation.  We Demiyah Fischbach plan for cardioversion later today. ? ?Prince Olivier M. Annalena Piatt MD ?05/12/2021 ?8:26 AM  ?

## 2021-05-12 NOTE — Interval H&P Note (Signed)
History and Physical Interval Note: ? ?05/12/2021 ?12:19 PM ? ?Brandi Spence  has presented today for surgery, with the diagnosis of afib.  The various methods of treatment have been discussed with the patient and family. After consideration of risks, benefits and other options for treatment, the patient has consented to  Procedure(s): ?CARDIOVERSION (N/A) as a surgical intervention.  The patient's history has been reviewed, patient examined, no change in status, stable for surgery.  I have reviewed the patient's chart and labs.  Questions were answered to the patient's satisfaction.   ? ? ?Brandi Spence ? ? ?

## 2021-05-12 NOTE — Anesthesia Preprocedure Evaluation (Addendum)
Anesthesia Evaluation  ?Patient identified by MRN, date of birth, ID band ?Patient awake ? ? ? ?Reviewed: ?Allergy & Precautions, NPO status , Patient's Chart, lab work & pertinent test results, reviewed documented beta blocker date and time  ? ?Airway ?Mallampati: I ? ?TM Distance: >3 FB ?Neck ROM: Full ? ? ? Dental ?no notable dental hx. ?(+) Teeth Intact, Dental Advisory Given ?  ?Pulmonary ?neg pulmonary ROS, former smoker,  ?  ?Pulmonary exam normal ?breath sounds clear to auscultation ? ? ? ? ? ? Cardiovascular ?hypertension, Pt. on home beta blockers and Pt. on medications ?Normal cardiovascular exam+ dysrhythmias Atrial Fibrillation + Valvular Problems/Murmurs MR  ?Rhythm:Irregular Rate:Normal ? ?TTE 2022 ?1. Left ventricular ejection fraction, by estimation, is 55 to 60%. The  ?left ventricle has normal function. The left ventricle has no regional  ?wall motion abnormalities. Left ventricular diastolic function could not  ?be evaluated.  ??2. Right ventricular systolic function is low normal. The right  ?ventricular size is normal. There is moderately elevated pulmonary artery  ?systolic pressure. The estimated right ventricular systolic pressure is  ?20.9 mmHg.  ??3. Left atrial size was moderately dilated.  ??4. The mitral valve is grossly normal. Mild to moderate mitral valve  ?regurgitation.  ??5. The aortic valve is tricuspid. Aortic valve regurgitation is not  ?visualized.  ??6. The inferior vena cava is dilated in size with <50% respiratory  ?variability, suggesting right atrial pressure of 15 mmHg.  ?  ?Neuro/Psych ?negative neurological ROS ? negative psych ROS  ? GI/Hepatic ?Neg liver ROS, GERD  ,  ?Endo/Other  ?Hypothyroidism  ? Renal/GU ?negative Renal ROS  ?negative genitourinary ?  ?Musculoskeletal ? ?(+) Arthritis ,  ? Abdominal ?  ?Peds ? Hematology ? ?(+) Blood dyscrasia (on eliquis), ,   ?Anesthesia Other Findings ? ? Reproductive/Obstetrics ? ?   ? ? ? ? ? ? ? ? ? ? ? ? ? ?  ?  ? ? ? ? ? ? ?Anesthesia Physical ?Anesthesia Plan ? ?ASA: 3 ? ?Anesthesia Plan: General  ? ?Post-op Pain Management:   ? ?Induction: Intravenous ? ?PONV Risk Score and Plan: Propofol infusion and Treatment may vary due to age or medical condition ? ?Airway Management Planned: Natural Airway ? ?Additional Equipment:  ? ?Intra-op Plan:  ? ?Post-operative Plan:  ? ?Informed Consent: I have reviewed the patients History and Physical, chart, labs and discussed the procedure including the risks, benefits and alternatives for the proposed anesthesia with the patient or authorized representative who has indicated his/her understanding and acceptance.  ? ? ? ?Dental advisory given ? ?Plan Discussed with: CRNA ? ?Anesthesia Plan Comments:   ? ? ? ? ? ? ?Anesthesia Quick Evaluation ? ?

## 2021-05-12 NOTE — Progress Notes (Addendum)
? ?Electrophysiology Rounding Note ? ?Patient Name: Brandi Spence ?Date of Encounter: 05/12/2021 ? ?Primary Cardiologist: Janina Mayo, MD  ?Electrophysiologist: Dr. Curt Bears ? ? ?Subjective  ? ?Pt remains in afib on Tikosyn 500 mcg BID  ? ?QTc from EKG last pm shows stable QTc at ~480 ? ?The patient is doing well today.  At this time, the patient denies chest pain, shortness of breath, or any new concerns. ? ?Inpatient Medications  ?  ?Scheduled Meds: ? apixaban  5 mg Oral BID  ? atorvastatin  10 mg Oral Daily  ? docusate sodium  100 mg Oral QHS  ? dofetilide  500 mcg Oral BID  ? levothyroxine  75 mcg Oral QAC breakfast  ? loratadine  10 mg Oral QHS  ? melatonin  3 mg Oral QHS  ? metoprolol succinate  12.5 mg Oral Daily  ? pregabalin  200 mg Oral BID  ? sodium chloride flush  3 mL Intravenous Q12H  ? ?Continuous Infusions: ? sodium chloride    ? sodium chloride    ? ?PRN Meds: ?sodium chloride, diltiazem, furosemide, sodium chloride flush  ? ?Vital Signs  ?  ?Vitals:  ? 05/11/21 1251 05/11/21 2027 05/11/21 2038 05/12/21 0601  ?BP: 121/87 (!) 136/98  125/73  ?Pulse: 90 70  80  ?Resp: '16 20 15 20  '$ ?Temp: 97.7 ?F (36.5 ?C) 97.9 ?F (36.6 ?C)  98 ?F (36.7 ?C)  ?TempSrc: Oral Oral  Oral  ?SpO2: 100% 95%    ?Weight:      ?Height:      ? ? ?Intake/Output Summary (Last 24 hours) at 05/12/2021 0702 ?Last data filed at 05/11/2021 [phone] ?Gross per 24 hour  ?Intake 360 ml  ?Output --  ?Net 360 ml  ? ?Filed Weights  ? 05/10/21 1608  ?Weight: 89.7 kg  ? ? ?Physical Exam  ?  ?GEN- The patient is well appearing, alert and oriented x 3 today.   ?Head- normocephalic, atraumatic ?Eyes-  Sclera clear, conjunctiva pink ?Ears- hearing intact ?Oropharynx- clear ?Neck- supple ?Lungs- Clear to ausculation bilaterally, normal work of breathing ?Heart-  Somewhat tachy, and irregularly irregular  rate and rhythm, no murmurs, rubs or gallops ?GI- soft, NT, ND, + BS ?Extremities- no clubbing, cyanosis, or edema ?Skin- no rash or lesion ?Psych-  euthymic mood, full affect ?Neuro- strength and sensation are intact ? ?Labs  ?  ?CBC ?No results for input(s): WBC, NEUTROABS, HGB, HCT, MCV, PLT in the last 72 hours. ?Basic Metabolic Panel ?Recent Labs  ?  05/11/21 ?0532 05/12/21 ?0254  ?NA 140 139  ?K 4.4 4.3  ?CL 105 106  ?CO2 28 26  ?GLUCOSE 101* 101*  ?BUN 13 17  ?CREATININE 0.93 0.91  ?CALCIUM 8.9 9.1  ?MG 2.4 2.2  ? ? ?Potassium  ?Date/Time Value Ref Range Status  ?05/12/2021 02:54 AM 4.3 3.5 - 5.1 mmol/L Final  ? ?Magnesium  ?Date/Time Value Ref Range Status  ?05/12/2021 02:54 AM 2.2 1.7 - 2.4 mg/dL Final  ?  Comment:  ?  Performed at [facility] [address] [place], [place] [address]  ? ? ?Telemetry  ?  ?AF/AF-RVR 90-120s (personally reviewed) ? ?Radiology  ?  ?No results found. ? ? ?Patient Profile  ?   ?Brandi Spence is a 76 y.o. female with a past medical history significant for persistent atrial fibrillation.  They were admitted for tikosyn load.  ? ?Assessment & Plan  ?  ?Persistent atrial fibrillation ?Pt remains in afib on  Tikosyn 500 mcg BID  ?Continue Eliquis ?Electrolytes stable.  ?CHA2DS2VASC is at least 4.  ? ? ?For [facility] this afternoon if does not convert chemically.  ? ? ?For questions or updates, please contact [place] ?Please consult [email or web address] for contact info under Cardiology/STEMI. ? ?Signed, ?Shirley Friar, PA-C  ?05/12/2021, 7:02 AM  ? ?I have seen and examined this patient with Oda Kilts.  Agree with above, note added to reflect my findings.  Remains in atrial fibrillation.  Ready for cardioversion later today. ? ?GEN: Well nourished, well developed, in no acute distress  ?HEENT: normal  ?Neck: no JVD, carotid bruits, or masses ?Cardiac: Irregular ; no murmurs, rubs, or gallops,no edema  ?Respiratory:  clear to auscultation bilaterally, normal work of breathing ?GI: soft, nontender, nondistended, + BS ?MS: no deformity or atrophy  ?Skin: warm and dry ?Neuro:  Strength and sensation are intact ?Psych:  euthymic mood, full affect  ? ?Persistent atrial fibrillation: Currently on Eliquis 5 mg twice daily, dofetilide 500 mcg twice daily.  She remains in atrial fibrillation.  We Pascal Stiggers plan for cardioversion later today. ? ?Temisha Murley M. Micheal Murad MD ?05/12/2021 ?8:26 AM  ?

## 2021-05-12 NOTE — Transfer of Care (Signed)
Immediate Anesthesia Transfer of Care Note ? ?Patient: Brandi Spence ? ?Procedure(s) Performed: CARDIOVERSION ? ?Patient Location: Endoscopy Unit ? ?Anesthesia Type:General ? ?Level of Consciousness: awake, alert  and sedated ? ?Airway & Oxygen Therapy: Patient connected to nasal cannula oxygen ? ?Post-op Assessment: Post -op Vital signs reviewed and stable ? ?Post vital signs: stable ? ?Last Vitals:  ?Vitals Value Taken Time  ?BP    ?Temp    ?Pulse    ?Resp    ?SpO2    ? ? ?Last Pain:  ?Vitals:  ? 05/12/21 1221  ?TempSrc: Oral  ?PainSc: 0-No pain  ?   ? ?Patients Stated Pain Goal: 0 (05/11/21 2200) ? ?Complications: No notable events documented. ?

## 2021-05-13 ENCOUNTER — Other Ambulatory Visit (HOSPITAL_COMMUNITY): Payer: Self-pay | Admitting: *Deleted

## 2021-05-13 ENCOUNTER — Other Ambulatory Visit (HOSPITAL_COMMUNITY): Payer: Self-pay

## 2021-05-13 LAB — BASIC METABOLIC PANEL
Anion gap: 8 (ref 5–15)
BUN: 17 mg/dL (ref 8–23)
CO2: 26 mmol/L (ref 22–32)
Calcium: 9.1 mg/dL (ref 8.9–10.3)
Chloride: 104 mmol/L (ref 98–111)
Creatinine, Ser: 0.87 mg/dL (ref 0.44–1.00)
GFR, Estimated: >60 mL/min (ref >60)
Glucose, Bld: 91 mg/dL (ref 70–99)
Potassium: 4.1 mmol/L (ref 3.5–5.1)
Sodium: 138 mmol/L (ref 135–145)

## 2021-05-13 LAB — MAGNESIUM: Magnesium: 2.1 mg/dL (ref 1.7–2.4)

## 2021-05-13 MED ORDER — FUROSEMIDE 20 MG PO TABS: 20.0000 mg | ORAL_TABLET | Freq: Every day | ORAL | 0 refills | Status: DC | PRN

## 2021-05-13 MED ORDER — DOFETILIDE 250 MCG PO CAPS
250.0000 ug | ORAL_CAPSULE | Freq: Two times a day (BID) | ORAL | 6 refills | Status: DC
  Filled 2021-05-13: qty 60, 30d supply, fill #0

## 2021-05-13 MED ORDER — DOFETILIDE 250 MCG PO CAPS
250.0000 ug | ORAL_CAPSULE | Freq: Two times a day (BID) | ORAL | Status: DC
Start: 2021-05-13 — End: 2021-05-13
  Administered 2021-05-13: 250 ug via ORAL
  Filled 2021-05-13: qty 1

## 2021-05-13 NOTE — Discharge Summary (Addendum)
? ? ? ?ELECTROPHYSIOLOGY PROCEDURE DISCHARGE SUMMARY  ? ? ?Patient ID: Brandi Spence,  ?MRN: [id number], DOB/AGE: [birth year] 76 y.o. ? ?Admit date: 05/10/2021 ?Discharge date: 05/13/2021 ? ?Primary Care Physician: Isaac Bliss, Rayford Halsted, MD  ?Primary Cardiologist: Janina Mayo, MD  ?Electrophysiologist: None  ? ?Primary Discharge Diagnosis:  ?1.  Persistent atrial fibrillation status post Tikosyn loading this admission ? ?Secondary Discharge Diagnosis:  ?2. HTN ?3. Obesity ? ?Allergies  ?Allergen Reactions  ? Prednisone Other (See Comments)  ?  Could not sleep and agitated  ? ? ? ?Procedures This Admission:  ?1.  Tikosyn loading ?2.  Direct current cardioversion on Thursday May 12, 2021  by Dr Sallyanne Kuster which successfully restored SR.  There were no early apparent complications.  ? ?Brief HPI: ?Brandi Spence is a 76 y.o. female with a past medical history as noted above.  They were referred to EP in the outpatient setting for treatment options of atrial fibrillation.  Risks, benefits, and alternatives to Tikosyn were reviewed with the patient who wished to proceed.   ? ?Hospital Course:  ?The patient was admitted and Tikosyn was initiated.  Renal function and electrolytes were followed during the hospitalization.   On 05/12/2021 they underwent direct current cardioversion which restored sinus rhythm. She had mild QT prolongation after [place], and dose was reduced to 250 mcg BID.  They were monitored until discharge on telemetry which demonstrated NSR.  On the day of discharge, they were examined by Dr. Curt Bears  who considered them stable for discharge to home.  Follow-up has been arranged with the Atrial Fibrillation clinic in approximately 1 week and with  EP APP   in 4 weeks.  ? ?Physical Exam: ?Vitals:  ? 05/13/21 0552 05/13/21 0818 05/13/21 0958 05/13/21 1217  ?BP: 115/66 125/66 121/62 113/60  ?Pulse: (!) 51  64   ?Resp: 17 19    ?Temp: 97.6 ?F (36.4 ?C) 97.7 ?F (36.5 ?C)    ?TempSrc: Oral Oral    ?SpO2:  100% 96%    ?Weight:      ?Height:      ? ? ?GEN- The patient is well appearing, alert and oriented x 3 today.   ?HEENT: normocephalic, atraumatic; sclera clear, conjunctiva pink; hearing intact; oropharynx clear; neck supple, no JVP ?Lymph- no cervical lymphadenopathy ?Lungs- Clear to ausculation bilaterally, normal work of breathing.  No wheezes, rales, rhonchi ?Heart- Regular rate and rhythm, no murmurs, rubs or gallops, PMI not laterally displaced ?GI- soft, non-tender, non-distended, bowel sounds present, no hepatosplenomegaly ?Extremities- no clubbing, cyanosis, or edema; DP/PT/radial pulses 2+ bilaterally ?MS- no significant deformity or atrophy ?Skin- warm and dry, no rash or lesion ?Psych- euthymic mood, full affect ?Neuro- strength and sensation are intact ? ? ?Labs: ?  ?Lab Results  ?Component Value Date  ? WBC 4.8 03/28/2021  ? HGB 14.7 03/28/2021  ? HCT 45.1 03/28/2021  ? MCV 93.0 03/28/2021  ? PLT 222 03/28/2021  ?  ?Recent Labs  ?Lab 05/13/21 ?0139  ?NA 138  ?K 4.1  ?CL 104  ?CO2 26  ?BUN 17  ?CREATININE 0.87  ?CALCIUM 9.1  ?GLUCOSE 91  ? ? ? ?Discharge Medications:  ?Allergies as of 05/13/2021   ? ?   Reactions  ? Prednisone Other (See Comments)  ? Could not sleep and agitated  ? ?  ? ?  ?Medication List  ?  ? ?TAKE these medications   ? ?atorvastatin 10 MG tablet ?Commonly known as: Lipitor ?Take 1 tablet (10  mg total) by mouth daily. ?  ?cetirizine 10 MG tablet ?Commonly known as: ZYRTEC ?Take 10 mg by mouth at bedtime. ?  ?diltiazem 30 MG tablet ?Commonly known as: Cardizem ?Take 1 tablet every 4 hours AS NEEDED for heart rate >100 ?What changed:  ?how much to take ?how to take this ?when to take this ?additional instructions ?  ?docusate sodium 100 MG capsule ?Commonly known as: COLACE ?Take 100 mg by mouth at bedtime. ?  ?dofetilide 250 MCG capsule ?Commonly known as: TIKOSYN ?Take 1 capsule (250 mcg total) by mouth 2 (two) times daily. ?  ?Eliquis 5 MG Tabs tablet ?Generic drug: apixaban ?Take 1  tablet (5 mg total) by mouth 2 (two) times daily. ?  ?FIBER PO ?Take 1 tablet by mouth at bedtime. ?  ?furosemide 20 MG tablet ?Commonly known as: LASIX ?Taking one tablet by mouth as needed for swelling ?  ?levothyroxine 75 MCG tablet ?Commonly known as: SYNTHROID ?Take 75 mcg by mouth daily before breakfast. ?  ?metoprolol succinate 25 MG 24 hr tablet ?Commonly known as: TOPROL-XL ?Take 12.5 mg by mouth daily. ?  ?OVER THE COUNTER MEDICATION ?Take 2 tablets by mouth daily. BondiBoost Hair Growth Supplement ?  ?potassium gluconate 595 (99 K) MG Tabs tablet ?Take 595 mg by mouth daily. ?  ?pregabalin 200 MG capsule ?Commonly known as: LYRICA ?Take 200 mg by mouth in the morning and at bedtime. ?  ? ?  ? ? ?Disposition:  ? ? Follow-up Information   ? ? [facility] Follow up.   ?Specialty: Cardiology ?Why: on 3/16 at 130 for post hospital follow up ?Contact information: ?[address] ?[phone] mc ?[place] ?[phone] ? ?  ?  ? ?  ?  ? ?  ? ? ?Duration of Discharge Encounter: Greater than 30 minutes including physician time. ? ?Signed, ?Shirley Friar, PA-C  ?05/13/2021 ?12:53 PM ? ? ? ?I have seen and examined this patient with Oda Kilts.  Agree with above, note added to reflect my findings.  Feeling well in sinus rhythm. QTc prolonged after DCCV. Dose reduced to 250 mcg.  ? ?GEN: Well nourished, well developed, in no acute distress  ?HEENT: normal  ?Neck: no JVD, carotid bruits, or masses ?Cardiac: RRR; no murmurs, rubs, or gallops,no edema  ?Respiratory:  clear to auscultation bilaterally, normal work of breathing ?GI: soft, nontender, nondistended, + BS ?MS: no deformity or atrophy  ?Skin: warm and dry ?Neuro:  Strength and sensation are intact ?Psych: euthymic mood, full affect  ? ?Persistent atrial fibrillation: currently on tikosyn. Required DCCV now on reduced dose for QTc prolongation. Improved on ECG today. Plan for discharge.  ? ?Katlin Ciszewski  M. Deajah Erkkila MD ?05/13/2021 ?12:58 PM  ?

## 2021-05-13 NOTE — Care Management Important Message (Signed)
Important Message  Patient Details  Name: ERIELLE GAWRONSKI MRN: [id number] Date of Birth: 06/21/1945   Medicare Important Message Given:  Yes     Shelda Altes 05/13/2021, 9:43 AM

## 2021-05-13 NOTE — Progress Notes (Signed)
EKG from yesterday evening 05/12/2021 reviewed   ? ?Shows is in NSR s/p [place] at 51 bpm with prolonged QTc at ~510-520 ms. ? ?Will review appropriate dose with Dr. Curt Bears. Disposition pending morning post dose QTc.  ? ? ?Shirley Friar, PA-C  ?Pager: [phone]  ?05/13/2021 7:11 AM  ? ?

## 2021-05-13 NOTE — Anesthesia Postprocedure Evaluation (Signed)
Anesthesia Post Note ? ?Patient: DOLORES EWING ? ?Procedure(s) Performed: CARDIOVERSION ? ?  ? ?Patient location during evaluation: Endoscopy ?Anesthesia Type: General ?Level of consciousness: awake and alert ?Pain management: pain level controlled ?Vital Signs Assessment: post-procedure vital signs reviewed and stable ?Respiratory status: spontaneous breathing, nonlabored ventilation, respiratory function stable and patient connected to nasal cannula oxygen ?Cardiovascular status: blood pressure returned to baseline and stable ?Postop Assessment: no apparent nausea or vomiting ?Anesthetic complications: no ? ? ?No notable events documented. ? ?Last Vitals:  ?Vitals:  ? 05/13/21 0028 05/13/21 0552  ?BP: 112/68 115/66  ?Pulse: 68 (!) 51  ?Resp: 17 17  ?Temp: 36.4 ?C 36.4 ?C  ?SpO2: 100% 100%  ?  ?Last Pain:  ?Vitals:  ? 05/13/21 0552  ?TempSrc: Oral  ?PainSc:   ? ? ?  ?  ?  ?  ?  ?  ? ?Brixon Zhen L Bensen [place] ? ? ? ? ?

## 2021-05-13 NOTE — Progress Notes (Signed)
Pharmacy: Dofetilide (Tikosyn) - Follow Up ?Assessment and Electrolyte Replacement ? ?Pharmacy consulted to assist in monitoring and replacing electrolytes in this 76 y.o. female admitted on 05/10/2021 undergoing dofetilide initiation. First dofetilide dose: 500 mcg bid > reduced to 250 mg BID today. ? ?Labs: ?   ?Component Value Date/Time  ? K 4.1 05/13/2021 0139  ? MG 2.1 05/13/2021 0139  ?  ? ?Plan: ?Potassium: ?K >/= 4: No additional supplementation needed ? ?Magnesium: ?Mg > 2: No additional supplementation needed ? ? ?As patient has required only 1 dose of KCl 60 meq on 3/7. ? ?Consider Potassium chloride 10 mEq  daily at discharge, or 20 meq three times a week? ? ?Thank you for allowing pharmacy to participate in this patient's care  ? ?[place] Crane, Pharm D, BCPS, BCCP ?Clinical Pharmacist ? 05/13/2021 7:35 AM  ? ?[facility] pharmacy phone numbers are listed on [email or web address] ? ?

## 2021-05-18 DIAGNOSIS — M7741 Metatarsalgia, right foot: Secondary | ICD-10-CM | POA: Diagnosis not present

## 2021-05-18 DIAGNOSIS — M7742 Metatarsalgia, left foot: Secondary | ICD-10-CM | POA: Diagnosis not present

## 2021-05-18 DIAGNOSIS — G894 Chronic pain syndrome: Secondary | ICD-10-CM | POA: Diagnosis not present

## 2021-05-18 DIAGNOSIS — G603 Idiopathic progressive neuropathy: Secondary | ICD-10-CM | POA: Diagnosis not present

## 2021-05-19 ENCOUNTER — Encounter (HOSPITAL_COMMUNITY): Payer: Self-pay | Admitting: Physician Assistant

## 2021-05-19 ENCOUNTER — Other Ambulatory Visit: Payer: Self-pay

## 2021-05-19 ENCOUNTER — Ambulatory Visit (HOSPITAL_COMMUNITY)
Admit: 2021-05-19 | Discharge: 2021-05-19 | Disposition: A | Discharge status: Home / Self Care | Payer: Medicare Other | Source: Ambulatory Visit | Attending: Physician Assistant | Admitting: Physician Assistant

## 2021-05-19 VITALS — BP 146/90 | HR 61 | Ht 66.0 in | Wt 197.2 lb

## 2021-05-19 DIAGNOSIS — E669 Obesity, unspecified: Secondary | ICD-10-CM | POA: Insufficient documentation

## 2021-05-19 DIAGNOSIS — Z79899 Other long term (current) drug therapy: Secondary | ICD-10-CM | POA: Diagnosis not present

## 2021-05-19 DIAGNOSIS — I10 Essential (primary) hypertension: Secondary | ICD-10-CM | POA: Insufficient documentation

## 2021-05-19 DIAGNOSIS — E785 Hyperlipidemia, unspecified: Secondary | ICD-10-CM | POA: Insufficient documentation

## 2021-05-19 DIAGNOSIS — Z7901 Long term (current) use of anticoagulants: Secondary | ICD-10-CM | POA: Diagnosis not present

## 2021-05-19 DIAGNOSIS — D6869 Other thrombophilia: Secondary | ICD-10-CM | POA: Insufficient documentation

## 2021-05-19 DIAGNOSIS — I38 Endocarditis, valve unspecified: Secondary | ICD-10-CM | POA: Diagnosis not present

## 2021-05-19 DIAGNOSIS — I4819 Other persistent atrial fibrillation: Secondary | ICD-10-CM | POA: Diagnosis not present

## 2021-05-19 DIAGNOSIS — E039 Hypothyroidism, unspecified: Secondary | ICD-10-CM | POA: Insufficient documentation

## 2021-05-19 DIAGNOSIS — Z6831 Body mass index (BMI) 31.0-31.9, adult: Secondary | ICD-10-CM | POA: Diagnosis not present

## 2021-05-19 LAB — BASIC METABOLIC PANEL
Anion gap: 10 (ref 5–15)
BUN: 14 mg/dL (ref 8–23)
CO2: 31 mmol/L (ref 22–32)
Calcium: 9.4 mg/dL (ref 8.9–10.3)
Chloride: 100 mmol/L (ref 98–111)
Creatinine, Ser: 0.77 mg/dL (ref 0.44–1.00)
GFR, Estimated: >60 mL/min (ref >60)
Glucose, Bld: 103 mg/dL — ABNORMAL HIGH (ref 70–99)
Potassium: 4.2 mmol/L (ref 3.5–5.1)
Sodium: 141 mmol/L (ref 135–145)

## 2021-05-19 LAB — MAGNESIUM: Magnesium: 2.1 mg/dL (ref 1.7–2.4)

## 2021-05-19 MED ORDER — DOFETILIDE 250 MCG PO CAPS: 250.0000 ug | ORAL_CAPSULE | Freq: Two times a day (BID) | ORAL | 1 refills | Status: DC

## 2021-05-19 NOTE — Progress Notes (Signed)
? ? ?Primary Care Physician: Isaac Bliss, Rayford Halsted, MD ?Primary Cardiologist: Dr Phineas Inches ?Primary Electrophysiologist: Dr Curt Bears ?Referring Physician: Dr Phineas Inches ? ? ?Brandi Spence is a 76 y.o. female with a history of HTN, hypothyroid, HLD, atrial fibrillation who presents for follow up in the [facility]. The patient was initially diagnosed with atrial fibrillation 11/04/20 at a visit with her PCP although she was not told she had afib at that time. She was unaware of her arrhythmia with no symptoms. She was scheduled for total knee replacement on 01/17/21 but this was cancelled due to patient being in afib. Patient is on Eliquis for a CHADS2VASC score of 4. Seen by Dr Harl Bowie on 01/18/21 and started on BB. Patient is s/p DCCV on 02/08/21. Unfortunately, she was back in afib at follow up. She purchased a Kardia mobile device on 02/11/21 which showed afib at that time. Patient is s/p DCCV on 03/28/21. Her BB was stopped and her flecainide was decreased due to bradycardia. She did feel better in SR with less dyspnea on exertion.  ? ?On follow up today, patient is s/p dofetilide admission 3/7-3/10/23 with DCCV on 05/12/21. She denies any issues with the medication and feels well today. No bleeding issues on anticoagulation.   ? ?Today, she denies symptoms of palpitations, chest pain, shortness of breath, orthopnea, PND, lower extremity edema, dizziness, presyncope, syncope, snoring, daytime somnolence, bleeding, or neurologic sequela. The patient is tolerating medications without difficulties and is otherwise without complaint today.  ? ? ?Atrial Fibrillation Risk Factors: ? ?she does not have symptoms or diagnosis of sleep apnea. ?she does not have a history of rheumatic fever. ?she does not have a history of alcohol use. ?The patient does not have a history of early familial atrial fibrillation or other arrhythmias. ? ?she has a BMI of Body mass index is 31.83 kg/m?Marland KitchenMarland Kitchen ?Filed  Weights  ? 05/19/21 1338  ?Weight: 89.4 kg  ? ? ? ? ?Family History  ?Problem Relation Age of Onset  ? Colon cancer Father 57  ? Colon polyps Father   ? Esophageal cancer Neg Hx   ? Rectal cancer Neg Hx   ? Stomach cancer Neg Hx   ? ? ? ?Atrial Fibrillation Management history: ? ?Previous antiarrhythmic drugs: flecainide, dofetilide   ?Previous cardioversions: 02/08/21, 03/28/21, 05/12/21 ?Previous ablations: none ?CHADS2VASC score: 4 ?Anticoagulation history: Eliquis ? ? ?Past Medical History:  ?Diagnosis Date  ? Arthritis   ? History of kidney stones   ? Hyperlipidemia   ? Hypertension   ? Hypothyroidism   ? Neuropathy   ? feet  ? Pre-diabetes   ? Primary localized osteoarthritis of right knee 01/05/2021  ? ?Past Surgical History:  ?Procedure Laterality Date  ? ABDOMINAL HYSTERECTOMY  2005  ? BREAST ENHANCEMENT SURGERY Bilateral 2000  ? BUNIONECTOMY Right 2013  ? CARDIOVERSION N/A 02/08/2021  ? Procedure: CARDIOVERSION;  Surgeon: Donato Heinz, MD;  Location: [place];  Service: Cardiovascular;  Laterality: N/A;  ? CARDIOVERSION N/A 03/28/2021  ? Procedure: CARDIOVERSION;  Surgeon: Jerline Pain, MD;  Location: [facility] ENDOSCOPY;  Service: Cardiovascular;  Laterality: N/A;  ? CARDIOVERSION N/A 05/12/2021  ? Procedure: CARDIOVERSION;  Surgeon: Sanda Klein, MD;  Location: [place];  Service: Cardiovascular;  Laterality: N/A;  ? [place]  ? COLONOSCOPY  01/01/2013  ? [place] Bilateral 1980  ? right foot, 2004  ? KNEE ARTHROSCOPY Right 2006  ? 2012  ? PAROTID GLAND  TUMOR EXCISION  2009  ? POLYPECTOMY    ? SPINAL CORD STIMULATOR IMPLANT Left 2012  ? TARSAL TUNNEL RELEASE Right 2005  ? TONSILLECTOMY    ? ? ?Current Outpatient Medications  ?Medication Sig Dispense Refill  ? atorvastatin (LIPITOR) 10 MG tablet Take 1 tablet (10 mg total) by mouth daily. 30 tablet 3  ? cetirizine (ZYRTEC) 10 MG tablet Take 10 mg by mouth at bedtime.    ? diltiazem (CARDIZEM) 30 MG tablet  Take 1 tablet every 4 hours AS NEEDED for heart rate >100 60 tablet 3  ? docusate sodium (COLACE) 100 MG capsule Take 100 mg by mouth at bedtime.    ? ELIQUIS 5 MG TABS tablet Take 1 tablet (5 mg total) by mouth 2 (two) times daily. 180 tablet 1  ? FIBER PO Take 1 tablet by mouth at bedtime.    ? furosemide (LASIX) 20 MG tablet Take 1 tablet (20 mg total) by mouth daily as needed for edema. 30 tablet 0  ? HYDROcodone-acetaminophen (NORCO/VICODIN) 5-325 MG tablet Take 1 tablet by mouth every 6 (six) hours as needed.    ? levothyroxine (SYNTHROID, LEVOTHROID) 75 MCG tablet Take 75 mcg by mouth daily before breakfast.    ? metoprolol succinate (TOPROL-XL) 25 MG 24 hr tablet Take 12.5 mg by mouth daily.    ? OVER THE COUNTER MEDICATION Take 2 tablets by mouth daily. BondiBoost Hair Growth Supplement    ? potassium gluconate 595 (99 K) MG TABS tablet Take 595 mg by mouth daily.    ? pregabalin (LYRICA) 200 MG capsule Take 200 mg by mouth in the morning and at bedtime.    ? dofetilide (TIKOSYN) 250 MCG capsule Take 1 capsule (250 mcg total) by mouth 2 (two) times daily. 90 capsule 1  ? ?No current facility-administered medications for this encounter.  ? ?Facility-Administered Medications Ordered in Other Encounters  ?Medication Dose Route Frequency Provider Last Rate Last Admin  ? fentaNYL citrate (PF) (SUBLIMAZE) injection   Intravenous Anesthesia Intra-op Eulas Post, April W, CRNA   100 mcg at 01/17/21 0981  ? ? ?Allergies  ?Allergen Reactions  ? Prednisone Other (See Comments)  ?  Could not sleep and agitated  ? ? ?Social History  ? ?Socioeconomic History  ? Marital status: Divorced  ?  Spouse name: Not on file  ? Number of children: Not on file  ? Years of education: Not on file  ? Highest education level: Not on file  ?Occupational History  ? Not on file  ?Tobacco Use  ? Smoking status: Former  ?  Packs/day: 1.00  ?  Years: 5.00  ?  Pack years: 5.00  ?  Types: Cigarettes  ?  Quit date: 06/26/1980  ?  Years since quitting:  40.9  ? Smokeless tobacco: Never  ? Tobacco comments:  ?  Former smoker 03/02/21  ?Vaping Use  ? Vaping Use: Never used  ?Substance and Sexual Activity  ? Alcohol use: Yes  ?  Alcohol/week: 1.0 standard drink  ?  Types: 1 Standard drinks or equivalent per week  ?  Comment: once a month 03/02/21  ? Drug use: No  ? Sexual activity: Not on file  ?Other Topics Concern  ? Not on file  ?Social History Narrative  ? Not on file  ? ?Social Determinants of Health  ? ?Financial Resource Strain: Not on file  ?Food Insecurity: Not on file  ?Transportation Needs: Not on file  ?Physical Activity: Not on file  ?Stress: Not  on file  ?Social Connections: Not on file  ?Intimate Partner Violence: Not on file  ? ? ? ?ROS- All systems are reviewed and negative except as per the HPI above. ? ?Physical Exam: ?Vitals:  ? 05/19/21 1338  ?BP: (!) 146/90  ?Pulse: 61  ?Weight: 89.4 kg  ?Height: '5\' 6"'$  (1.676 m)  ? ? ?GEN- The patient is a well appearing obese elderly female, alert and oriented x 3 today.   ?HEENT-head normocephalic, atraumatic, sclera clear, conjunctiva pink, hearing intact, trachea midline. ?Lungs- Clear to ausculation bilaterally, normal work of breathing ?Heart- Regular rate and rhythm, no murmurs, rubs or gallops  ?GI- soft, NT, ND, + BS ?Extremities- no clubbing, cyanosis, or edema ?MS- no significant deformity or atrophy ?Skin- no rash or lesion ?Psych- euthymic mood, full affect ?Neuro- strength and sensation are intact ? ? ?Wt Readings from Last 3 Encounters:  ?05/19/21 89.4 kg  ?05/10/21 89.7 kg  ?05/10/21 89.7 kg  ? ? ?EKG today demonstrates  ?SR ?Vent. rate 61 BPM ?PR interval 180 ms ?QRS duration 68 ms ?QT/QTcB 458/461 ms ? ?Echo 02/01/21 demonstrated ? 1. Left ventricular ejection fraction, by estimation, is 55 to 60%. The  ?left ventricle has normal function. The left ventricle has no regional  ?wall motion abnormalities. Left ventricular diastolic function could not  ?be evaluated.  ? 2. Right ventricular systolic  function is low normal. The right  ?ventricular size is normal. There is moderately elevated pulmonary artery systolic pressure. The estimated right ventricular systolic pressure is 09.3 mmHg.  ? 3. L

## 2021-05-21 ENCOUNTER — Other Ambulatory Visit (HOSPITAL_COMMUNITY): Payer: Self-pay | Admitting: Physician Assistant

## 2021-05-24 ENCOUNTER — Other Ambulatory Visit (HOSPITAL_COMMUNITY): Payer: Self-pay | Admitting: Physician Assistant

## 2021-06-04 DIAGNOSIS — Z20822 Contact with and (suspected) exposure to covid-19: Secondary | ICD-10-CM | POA: Diagnosis not present

## 2021-06-20 ENCOUNTER — Encounter: Payer: Self-pay | Admitting: Cardiology

## 2021-06-20 ENCOUNTER — Ambulatory Visit (INDEPENDENT_AMBULATORY_CARE_PROVIDER_SITE_OTHER): Payer: Medicare Other | Admitting: Cardiology

## 2021-06-20 ENCOUNTER — Telehealth (HOSPITAL_COMMUNITY): Payer: Self-pay | Admitting: *Deleted

## 2021-06-20 VITALS — BP 120/74 | HR 64 | Ht 66.5 in | Wt 195.0 lb

## 2021-06-20 DIAGNOSIS — I4819 Other persistent atrial fibrillation: Secondary | ICD-10-CM

## 2021-06-20 MED ORDER — SIMVASTATIN 20 MG PO TABS: 20.0000 mg | ORAL_TABLET | Freq: Every day | ORAL | 1 refills | Status: DC

## 2021-06-20 MED ORDER — DILTIAZEM HCL 30 MG PO TABS
ORAL_TABLET | ORAL | 1 refills | Status: DC
Start: 2021-06-20 — End: 2021-07-12

## 2021-06-20 NOTE — Telephone Encounter (Signed)
Pt off of cardizem daily now can stop atorvastatin and resume zocor. Pt in agreement.  ?

## 2021-06-20 NOTE — Progress Notes (Signed)
? ?Electrophysiology Office Note ? ? ?Date:  06/20/2021  ? ?ID:  MCKINZI ERIKSEN, DOB [birth year], MRN [id number] ? ?PCP:  Isaac Bliss, Rayford Halsted, MD  ?Cardiologist:  Phineas Inches ?Primary Electrophysiologist:  Gurtaj Ruz Meredith Leeds, MD   ? ?Chief Complaint: AF ?  ?History of Present Illness: ?Brandi Spence is a 76 y.o. female who is being seen today for the evaluation of AF at the request of Isaac Bliss, [place]*. Presenting today for electrophysiology evaluation. ? ?She has a history significant for hypertension, hypothyroid, hyperlipidemia, atrial fibrillation.  She was diagnosed with atrial fibrillation 11/04/2020 at a visit with her PCP.  She was unaware of the arrhythmia without symptoms.  She had knee replacement scheduled for 01/17/2021 but this was canceled due to her atrial fibrillation.  She had a cardioversion 02/08/2021 but unfortunately went back into atrial fibrillation and follow-up.  She felt better in sinus rhythm with less dyspnea on exertion.  She was admitted to the hospital 05/10/2021 for dofetilide load.  She is remained in sinus rhythm since that time. ? ?Today, she denies symptoms of palpitations, chest pain, shortness of breath, orthopnea, PND, lower extremity edema, claudication, dizziness, presyncope, syncope, bleeding, or neurologic sequela. The patient is tolerating medications without difficulties.  ? ? ?Past Medical History:  ?Diagnosis Date  ? Arthritis   ? History of kidney stones   ? Hyperlipidemia   ? Hypertension   ? Hypothyroidism   ? Neuropathy   ? feet  ? Pre-diabetes   ? Primary localized osteoarthritis of right knee 01/05/2021  ? ?Past Surgical History:  ?Procedure Laterality Date  ? ABDOMINAL HYSTERECTOMY  2005  ? BREAST ENHANCEMENT SURGERY Bilateral 2000  ? BUNIONECTOMY Right 2013  ? CARDIOVERSION N/A 02/08/2021  ? Procedure: CARDIOVERSION;  Surgeon: Donato Heinz, MD;  Location: [place];  Service: Cardiovascular;  Laterality: N/A;  ? CARDIOVERSION N/A  03/28/2021  ? Procedure: CARDIOVERSION;  Surgeon: Jerline Pain, MD;  Location: [facility] ENDOSCOPY;  Service: Cardiovascular;  Laterality: N/A;  ? CARDIOVERSION N/A 05/12/2021  ? Procedure: CARDIOVERSION;  Surgeon: Sanda Klein, MD;  Location: [place];  Service: Cardiovascular;  Laterality: N/A;  ? [place]  ? COLONOSCOPY  01/01/2013  ? [place] Bilateral 1980  ? right foot, 2004  ? KNEE ARTHROSCOPY Right 2006  ? 2012  ? PAROTID GLAND TUMOR EXCISION  2009  ? POLYPECTOMY    ? SPINAL CORD STIMULATOR IMPLANT Left 2012  ? TARSAL TUNNEL RELEASE Right 2005  ? TONSILLECTOMY    ? ? ? ?Current Outpatient Medications  ?Medication Sig Dispense Refill  ? cetirizine (ZYRTEC) 10 MG tablet Take 10 mg by mouth at bedtime.    ? diltiazem (CARDIZEM) 30 MG tablet Take 1 tablet every 4 hours AS NEEDED for heart rate >100 30 tablet 1  ? docusate sodium (COLACE) 100 MG capsule Take 100 mg by mouth at bedtime.    ? dofetilide (TIKOSYN) 250 MCG capsule Take 1 capsule (250 mcg total) by mouth 2 (two) times daily. 90 capsule 1  ? ELIQUIS 5 MG TABS tablet Take 1 tablet (5 mg total) by mouth 2 (two) times daily. 180 tablet 1  ? FIBER PO Take 1 tablet by mouth at bedtime.    ? furosemide (LASIX) 20 MG tablet TAKE 1 TABLET (20 MG TOTAL) BY MOUTH DAILY AS NEEDED FOR EDEMA. 30 tablet 0  ? HYDROcodone-acetaminophen (NORCO/VICODIN) 5-325 MG tablet Take 1 tablet by mouth every 6 (six) hours as needed.    ?  levothyroxine (SYNTHROID, LEVOTHROID) 75 MCG tablet Take 75 mcg by mouth daily before breakfast.    ? metoprolol succinate (TOPROL-XL) 25 MG 24 hr tablet Take 12.5 mg by mouth daily.    ? OVER THE COUNTER MEDICATION Take 2 tablets by mouth daily. BondiBoost Hair Growth Supplement    ? potassium gluconate 595 (99 K) MG TABS tablet Take 595 mg by mouth daily.    ? pregabalin (LYRICA) 200 MG capsule Take 200 mg by mouth in the morning and at bedtime.    ? simvastatin (ZOCOR) 20 MG tablet Take 1 tablet (20 mg  total) by mouth at bedtime. 90 tablet 1  ? ?No current facility-administered medications for this visit.  ? ?Facility-Administered Medications Ordered in Other Visits  ?Medication Dose Route Frequency Provider Last Rate Last Admin  ? fentaNYL citrate (PF) (SUBLIMAZE) injection   Intravenous Anesthesia Intra-op Eulas Post, April W, CRNA   100 mcg at 01/17/21 8338  ? ? ?Allergies:   Prednisone  ? ?Social History:  The patient  reports that she quit smoking about 41 years ago. Her smoking use included cigarettes. She has a 5.00 pack-year smoking history. She has never used smokeless tobacco. She reports current alcohol use of about 1.0 standard drink per week. She reports that she does not use drugs.  ? ?Family History:  The patient's family history includes Colon cancer (age of onset: 23) in her father; Colon polyps in her father.  ? ? ?ROS:  Please see the history of present illness.   Otherwise, review of systems is positive for none.   All other systems are reviewed and negative.  ? ? ?PHYSICAL EXAM: ?VS:  BP 120/74   Pulse 64   Ht 5' 6.5" (1.689 m)   Wt 195 lb (88.5 kg)   SpO2 99%   BMI 31.00 kg/m?  , BMI Body mass index is 31 kg/m?. ?GEN: Well nourished, well developed, in no acute distress  ?HEENT: normal  ?Neck: no JVD, carotid bruits, or masses ?Cardiac: RRR; no murmurs, rubs, or gallops,no edema  ?Respiratory:  clear to auscultation bilaterally, normal work of breathing ?GI: soft, nontender, nondistended, + BS ?MS: no deformity or atrophy  ?Skin: warm and dry ?Neuro:  Strength and sensation are intact ?Psych: euthymic mood, full affect ? ?EKG:  EKG is ordered today. ?Personal review of the ekg ordered shows sinus rhythm ? ?Recent Labs: ?08/04/2020: TSH 0.77 ?01/05/2021: ALT 39 ?03/28/2021: Hemoglobin 14.7; Platelets 222 ?05/19/2021: BUN 14; Creatinine, Ser 0.77; Magnesium 2.1; Potassium 4.2; Sodium 141  ? ? ?Lipid Panel  ?   ?Component Value Date/Time  ? CHOL 199 06/26/2019 1058  ? TRIG 223.0 (H) 06/26/2019 1058   ? HDL 50.60 06/26/2019 1058  ? CHOLHDL 4 06/26/2019 1058  ? VLDL 44.6 (H) 06/26/2019 1058  ? LDLCALC 114 (H) 03/28/2018 1023  ? LDLDIRECT 112.0 06/26/2019 1058  ? ? ? ?Wt Readings from Last 3 Encounters:  ?06/20/21 195 lb (88.5 kg)  ?05/19/21 197 lb 3.2 oz (89.4 kg)  ?05/10/21 197 lb 12.8 oz (89.7 kg)  ?  ? ? ?Other studies Reviewed: ?Additional studies/ records that were reviewed today include: TTE 02/01/21  ?Review of the above records today demonstrates:  ? 1. Left ventricular ejection fraction, by estimation, is 55 to 60%. The  ?left ventricle has normal function. The left ventricle has no regional  ?wall motion abnormalities. Left ventricular diastolic function could not  ?be evaluated.  ? 2. Right ventricular systolic function is low normal. The right  ?  ventricular size is normal. There is moderately elevated pulmonary artery  ?systolic pressure. The estimated right ventricular systolic pressure is  ?81.4 mmHg.  ? 3. Left atrial size was moderately dilated.  ? 4. The mitral valve is grossly normal. Mild to moderate mitral valve  ?regurgitation.  ? 5. The aortic valve is tricuspid. Aortic valve regurgitation is not  ?visualized.  ? 6. The inferior vena cava is dilated in size with <50% respiratory  ?variability, suggesting right atrial pressure of 15 mmHg.  ? ? ?ASSESSMENT AND PLAN: ? ?1.  Persistent atrial fibrillation: Currently on Eliquis 5 mg twice daily, dofetilide 250 mcg twice daily.  High risk medication monitoring today for dofetilide via ECG.  CHA2DS2-VASc of 4.  She is right now happy with her control, but potentially interested in ablation to get off of some medications.  We Karl Erway give her information on atrial fibrillation ablation.  She Karlos Scadden call us back if she decides that she wishes to change her rhythm control strategy. ? ?2.  Secondary hypercoagulable state: Currently on Eliquis for atrial fibrillation as above. ? ?3.  Obesity: Body mass index is 31 kg/m?. ?Diet and exercise  encouraged ? ? ? ?Current medicines are reviewed at length with the patient today.   ?The patient does not have concerns regarding her medicines.  The following changes were made today:  none ? ?Labs/ tests ordered today include

## 2021-06-20 NOTE — Patient Instructions (Addendum)
Medication Instructions:  ?Your physician recommends that you continue on your current medications as directed. Please refer to the Current Medication list given to you today. ? ?*If you need a refill on your cardiac medications before your next appointment, please call your pharmacy* ? ? ?Lab Work: ?None ordered ? ? ?Testing/Procedures: ?None ordered ? ? ?Follow-Up: ?At [facility], you and your health needs are our priority.  As part of our continuing mission to provide you with exceptional heart care, we have created designated Provider Care Teams.  These Care Teams include your primary Cardiologist (physician) and Advanced Practice Providers (APPs -  Physician Assistants and Nurse Practitioners) who all work together to provide you with the care you need, when you need it. ? ?Your next appointment:   ?09/22/2021 ? ?The format for your next appointment:   ?In Person ? ?Provider:   ?Malka So, PA ? ? ? ?Thank you for choosing [facility]!! ? ? ?Trinidad Curet, RN ?[phone] ? ? ?Other Instructions ? ?Cardiac Ablation ?Cardiac ablation is a procedure to destroy (ablate) some heart tissue that is sending bad signals. These bad signals cause problems in heart rhythm. ?The heart has many areas that make these signals. If there are problems in these areas, they can make the heart beat in a way that is not normal. Destroying some tissues can help make the heart rhythm normal. ?Tell your doctor about: ?Any allergies you have. ?All medicines you are taking. These include vitamins, herbs, eye drops, creams, and over-the-counter medicines. ?Any problems you or family members have had with medicines that make you fall asleep (anesthetics). ?Any blood disorders you have. ?Any surgeries you have had. ?Any medical conditions you have, such as kidney failure. ?Whether you are pregnant or may be pregnant. ?What are the risks? ?This is a safe procedure. But problems may occur, including: ?Infection. ?Bruising and  bleeding. ?Bleeding into the chest. ?Stroke or blood clots. ?Damage to nearby areas of your body. ?Allergies to medicines or dyes. ?The need for a pacemaker if the normal system is damaged. ?Failure of the procedure to treat the problem. ?What happens before the procedure? ?Medicines ?Ask your doctor about: ?Changing or stopping your normal medicines. This is important. ?Taking aspirin and ibuprofen. Do not take these medicines unless your doctor tells you to take them. ?Taking other medicines, vitamins, herbs, and supplements. ?General instructions ?Follow instructions from your doctor about what you cannot eat or drink. ?Plan to have someone take you home from the hospital or clinic. ?If you will be going home right after the procedure, plan to have someone with you for 24 hours. ?Ask your doctor what steps will be taken to prevent infection. ?What happens during the procedure? ? ?An IV tube will be put into one of your veins. ?You will be given a medicine to help you relax. ?The skin on your neck or groin will be numbed. ?A cut (incision) will be made in your neck or groin. A needle will be put through your cut and into a large vein. ?A tube (catheter) will be put into the needle. The tube will be moved to your heart. ?Dye may be put through the tube. This helps your doctor see your heart. ?Small devices (electrodes) on the tube will send out signals. ?A type of energy will be used to destroy some heart tissue. ?The tube will be taken out. ?Pressure will be held on your cut. This helps stop bleeding. ?A bandage will be put over your cut. ?  The exact procedure may vary among doctors and hospitals. ?What happens after the procedure? ?You will be watched until you leave the hospital or clinic. This includes checking your heart rate, breathing rate, oxygen, and blood pressure. ?Your cut will be watched for bleeding. You will need to lie still for a few hours. ?Do not drive for 24 hours or as long as your doctor tells  you. ?Summary ?Cardiac ablation is a procedure to destroy some heart tissue. This is done to treat heart rhythm problems. ?Tell your doctor about any medical conditions you may have. Tell him or her about all medicines you are taking to treat them. ?This is a safe procedure. But problems may occur. These include infection, bruising, bleeding, and damage to nearby areas of your body. ?Follow what your doctor tells you about food and drink. You may also be told to change or stop some of your medicines. ?After the procedure, do not drive for 24 hours or as long as your doctor tells you. ?This information is not intended to replace advice given to you by your health care provider. Make sure you discuss any questions you have with your health care provider. ?Document Revised: 01/23/2019 Document Reviewed: 01/23/2019 ?Elsevier Patient Education ? [place]. ? ?  ?

## 2021-06-23 DIAGNOSIS — M1712 Unilateral primary osteoarthritis, left knee: Secondary | ICD-10-CM | POA: Diagnosis not present

## 2021-06-29 ENCOUNTER — Other Ambulatory Visit (HOSPITAL_COMMUNITY): Payer: Self-pay

## 2021-06-29 MED ORDER — FUROSEMIDE 20 MG PO TABS: 20.0000 mg | ORAL_TABLET | Freq: Every day | ORAL | 0 refills | Status: DC | PRN

## 2021-07-03 ENCOUNTER — Other Ambulatory Visit: Payer: Self-pay | Admitting: Internal Medicine

## 2021-07-04 ENCOUNTER — Ambulatory Visit
Admission: RE | Admit: 2021-07-04 | Discharge: 2021-07-04 | Disposition: A | Discharge status: Home / Self Care | Payer: Medicare Other | Source: Ambulatory Visit | Attending: Endocrinology | Admitting: Endocrinology

## 2021-07-04 DIAGNOSIS — E041 Nontoxic single thyroid nodule: Secondary | ICD-10-CM

## 2021-07-04 NOTE — Telephone Encounter (Signed)
Prescription refill request for Eliquis received. ?Indication:Afib ?Last office visit:4/23 ?Scr:0.7 ?Age: 76 ?Weight:88.5 kg ? ?Prescription refilled ? ?

## 2021-07-12 ENCOUNTER — Other Ambulatory Visit (HOSPITAL_COMMUNITY): Payer: Self-pay | Admitting: Physician Assistant

## 2021-07-14 DIAGNOSIS — Z20822 Contact with and (suspected) exposure to covid-19: Secondary | ICD-10-CM | POA: Diagnosis not present

## 2021-08-02 ENCOUNTER — Other Ambulatory Visit (HOSPITAL_COMMUNITY): Payer: Self-pay

## 2021-08-02 MED ORDER — FUROSEMIDE 20 MG PO TABS: 20.0000 mg | ORAL_TABLET | Freq: Every day | ORAL | 0 refills | Status: DC | PRN

## 2021-08-04 DIAGNOSIS — E039 Hypothyroidism, unspecified: Secondary | ICD-10-CM | POA: Diagnosis not present

## 2021-08-04 DIAGNOSIS — E559 Vitamin D deficiency, unspecified: Secondary | ICD-10-CM | POA: Diagnosis not present

## 2021-08-04 DIAGNOSIS — R7301 Impaired fasting glucose: Secondary | ICD-10-CM | POA: Diagnosis not present

## 2021-08-05 ENCOUNTER — Other Ambulatory Visit (HOSPITAL_COMMUNITY): Payer: Self-pay | Admitting: *Deleted

## 2021-08-05 MED ORDER — DOFETILIDE 250 MCG PO CAPS: 250.0000 ug | ORAL_CAPSULE | Freq: Two times a day (BID) | ORAL | 1 refills | Status: DC

## 2021-08-11 DIAGNOSIS — G609 Hereditary and idiopathic neuropathy, unspecified: Secondary | ICD-10-CM | POA: Diagnosis not present

## 2021-08-11 DIAGNOSIS — E041 Nontoxic single thyroid nodule: Secondary | ICD-10-CM | POA: Diagnosis not present

## 2021-08-11 DIAGNOSIS — I482 Chronic atrial fibrillation, unspecified: Secondary | ICD-10-CM | POA: Diagnosis not present

## 2021-08-11 DIAGNOSIS — I10 Essential (primary) hypertension: Secondary | ICD-10-CM | POA: Diagnosis not present

## 2021-08-11 DIAGNOSIS — M8580 Other specified disorders of bone density and structure, unspecified site: Secondary | ICD-10-CM | POA: Diagnosis not present

## 2021-08-11 DIAGNOSIS — E039 Hypothyroidism, unspecified: Secondary | ICD-10-CM | POA: Diagnosis not present

## 2021-08-11 DIAGNOSIS — E559 Vitamin D deficiency, unspecified: Secondary | ICD-10-CM | POA: Diagnosis not present

## 2021-08-11 DIAGNOSIS — R7301 Impaired fasting glucose: Secondary | ICD-10-CM | POA: Diagnosis not present

## 2021-08-22 DIAGNOSIS — Z780 Asymptomatic menopausal state: Secondary | ICD-10-CM | POA: Diagnosis not present

## 2021-08-22 DIAGNOSIS — M1711 Unilateral primary osteoarthritis, right knee: Secondary | ICD-10-CM | POA: Diagnosis not present

## 2021-08-22 NOTE — H&P (Incomplete)
KNEE ARTHROPLASTY ADMISSION H&P  Patient ID: Brandi Spence MRN: [id number] DOB/AGE: [birth year] 76 y.o.  Chief Complaint: right knee pain.  Planned Procedure Date: 09/12/21 Medical Clearance by Dr. Jerilee Hoh   Cardiac Clearance by *** PM&R clearance by ***   HPI: Brandi Spence is a 76 y.o. female who presents for evaluation of OA RIGHT KNEE. The patient has a history of pain and functional disability in the right knee due to arthritis and has failed non-surgical conservative treatments for greater than 12 weeks to include NSAID's and/or analgesics, corticosteriod injections, viscosupplementation injections, and activity modification.  Onset of symptoms was gradual, starting >10 years ago with gradually worsening course since that time. The patient noted prior procedures on the knee to include  arthroscopy and menisectomy on the right knee.  Patient currently rates pain at 3 out of 10 with activity. Patient has night pain, worsening of pain with activity and weight bearing, and pain that interferes with activities of daily living.  Patient has evidence of subchondral sclerosis, periarticular osteophytes, and joint space narrowing by imaging studies.  There is no active infection.  Past Medical History:  Diagnosis Date   Arthritis    History of kidney stones    Hyperlipidemia    Hypertension    Hypothyroidism    Neuropathy    feet   Pre-diabetes    Primary localized osteoarthritis of right knee 01/05/2021   Past Surgical History:  Procedure Laterality Date   ABDOMINAL HYSTERECTOMY  2005   BREAST ENHANCEMENT SURGERY Bilateral 2000   BUNIONECTOMY Right 2013   CARDIOVERSION N/A 02/08/2021   Procedure: CARDIOVERSION;  Surgeon: Donato Heinz, MD;  Location: [facility];  Service: Cardiovascular;  Laterality: N/A;   CARDIOVERSION N/A 03/28/2021   Procedure: CARDIOVERSION;  Surgeon: Jerline Pain, MD;  Location: [facility];  Service: Cardiovascular;  Laterality: N/A;    CARDIOVERSION N/A 05/12/2021   Procedure: CARDIOVERSION;  Surgeon: Sanda Klein, MD;  Location: [facility];  Service: Cardiovascular;  Laterality: N/A;   CARPAL TUNNEL RELEASE Right 1997   COLONOSCOPY  01/01/2013   EXCISION MORTON'S NEUROMA Bilateral 1980   right foot, 2004   KNEE ARTHROSCOPY Right 2006   2012   PAROTID GLAND TUMOR EXCISION  2009   POLYPECTOMY     SPINAL CORD STIMULATOR IMPLANT Left 2012   TARSAL TUNNEL RELEASE Right 2005   TONSILLECTOMY     Allergies  Allergen Reactions   Prednisone Other (See Comments)    Could not sleep and agitated   Prior to Admission medications   Medication Sig Start Date End Date Taking? Authorizing Provider  cetirizine (ZYRTEC) 10 MG tablet Take 10 mg by mouth at bedtime.    [provider]  diltiazem (CARDIZEM) 30 MG tablet TAKE 1 TABLET EVERY 4 HOURS AS NEEDED FOR HEART RATE >100 07/12/21   Fenton, Clint R, PA  docusate sodium (COLACE) 100 MG capsule Take 100 mg by mouth at bedtime.    [provider]  dofetilide (TIKOSYN) 250 MCG capsule Take 1 capsule (250 mcg total) by mouth 2 (two) times daily. 08/05/21   Fenton, Clint R, PA  ELIQUIS 5 MG TABS tablet TAKE 1 TABLET TWICE A DAY 07/04/21   Janina Mayo, MD  FIBER PO Take 1 tablet by mouth at bedtime.    [provider]  furosemide (LASIX) 20 MG tablet Take 1 tablet (20 mg total) by mouth daily as needed for edema. 08/02/21   Fenton, Clint R, PA  HYDROcodone-acetaminophen (NORCO/VICODIN) 5-325  MG tablet Take 1 tablet by mouth every 6 (six) hours as needed. 05/18/21   [provider]  levothyroxine (SYNTHROID, LEVOTHROID) 75 MCG tablet Take 75 mcg by mouth daily before breakfast.    [provider]  metoprolol succinate (TOPROL-XL) 25 MG 24 hr tablet Take 12.5 mg by mouth daily.    [provider]  OVER THE COUNTER MEDICATION Take 2 tablets by mouth daily. BondiBoost Hair Growth Supplement    [provider]  potassium gluconate 595  (99 K) MG TABS tablet Take 595 mg by mouth daily.    [provider]  pregabalin (LYRICA) 200 MG capsule Take 200 mg by mouth in the morning and at bedtime. 11/25/20   [provider]  simvastatin (ZOCOR) 20 MG tablet Take 1 tablet (20 mg total) by mouth at bedtime. 06/20/21 09/18/21  Fenton, Doloris Hall, PA   Social History   Socioeconomic History   Marital status: Divorced    Spouse name: Not on file   Number of children: Not on file   Years of education: Not on file   Highest education level: Not on file  Occupational History   Not on file  Tobacco Use   Smoking status: Former    Packs/day: 1.00    Years: 5.00    Total pack years: 5.00    Types: Cigarettes    Quit date: 06/26/1980    Years since quitting: 41.1   Smokeless tobacco: Never   Tobacco comments:    Former smoker 03/02/21  Vaping Use   Vaping Use: Never used  Substance and Sexual Activity   Alcohol use: Yes    Alcohol/week: 1.0 standard drink of alcohol    Types: 1 Standard drinks or equivalent per week    Comment: once a month 03/02/21   Drug use: No   Sexual activity: Not on file  Other Topics Concern   Not on file  Social History Narrative   Not on file   Social Determinants of Health   Financial Resource Strain: Not on file  Food Insecurity: Not on file  Transportation Needs: Not on file  Physical Activity: Not on file  Stress: Not on file  Social Connections: Not on file   Family History  Problem Relation Age of Onset   Colon cancer Father 66   Colon polyps Father    Esophageal cancer Neg Hx    Rectal cancer Neg Hx    Stomach cancer Neg Hx     ROS: Currently denies lightheadedness, dizziness, Fever, chills, CP, SOB.   No personal history of DVT, PE, MI, or CVA. No loose teeth or dentures All other systems have been reviewed and were otherwise currently negative with the exception of those mentioned in the HPI and as above.  Objective: Vitals: Ht: *** Wt: *** lbs Temp: *** BP:  *** Pulse: *** O2 ***% on room air.   Physical Exam: General: Alert, NAD.  Antalgic Gait  HEENT: EOMI, Good Neck Extension  Pulm: No increased work of breathing.  Clear B/L A/P w/o crackle or wheeze.  CV: Irregularly irregular rhythm, No m/g/r appreciated  GI: soft, NT, ND. BS x 4 quadrants Neuro: CN II-XII grossly intact without focal deficit.  Sensation intact distally Skin: No lesions in the area of chief complaint MSK/Surgical Site:   no JLT. ROM 0-115.  Decreased strength in extension and flexion.  +EHL/FHL.  NVI.  Stable varus and valgus stress.    Imaging Review Plain radiographs demonstrate moderate degenerative joint  disease of the right knee.   The overall alignment isneutral. The bone quality appears to be fair for age and reported activity level.  Preoperative templating of the joint replacement has been completed, documented, and submitted to the Operating Room personnel in order to optimize intra-operative equipment management.  Assessment: OA RIGHT KNEE Active Problems:   * No active hospital problems. *   Plan: Plan for Procedure(s): TOTAL KNEE ARTHROPLASTY  The patient history, physical exam, clinical judgement of the provider and imaging are consistent with end stage degenerative joint disease and total joint arthroplasty is deemed medically necessary. The treatment options including medical management, injection therapy, and arthroplasty were discussed at length. The risks and benefits of Procedure(s): TOTAL KNEE ARTHROPLASTY were presented and reviewed.  The risks of nonoperative treatment, versus surgical intervention including but not limited to continued pain, aseptic loosening, stiffness, dislocation/subluxation, infection, bleeding, nerve injury, blood clots, cardiopulmonary complications, morbidity, mortality, among others were discussed. The patient verbalizes understanding and wishes to proceed with the plan.  Patient is being admitted for inpatient  treatment for surgery, pain control, PT, prophylactic antibiotics, VTE prophylaxis, progressive ambulation, ADL's and discharge planning.   Dental prophylaxis discussed and recommended for 2 years postoperatively.  The patient does meet the criteria for TXA which will be used perioperatively.   Eliquis '5mg'$  will be used postoperatively for DVT prophylaxis in addition to SCDs, and early ambulation. POD 1 and 2 she will take 2.'5mg'$  and POD 3 resume normal '5mg'$  dose.  Plan for Tylenol, Mobic, oxycodone for pain.    Zofran for nausea and vomiting. Colace for constipation prevention Pharmacy- *** The patient is planning to be discharged home with HHPT ([facility]) and into the care of *** who can be reached at *** Follow up appt ***     Alisa Graff Office [phone] 08/22/2021 4:12 PM

## 2021-08-26 ENCOUNTER — Other Ambulatory Visit (HOSPITAL_COMMUNITY): Payer: Self-pay | Admitting: Physician Assistant

## 2021-08-30 ENCOUNTER — Encounter (HOSPITAL_COMMUNITY): Payer: Self-pay

## 2021-08-30 ENCOUNTER — Other Ambulatory Visit: Payer: Self-pay

## 2021-08-30 ENCOUNTER — Encounter (HOSPITAL_COMMUNITY)
Admission: RE | Admit: 2021-08-30 | Discharge: 2021-08-30 | Disposition: A | Discharge status: Home / Self Care | Payer: Medicare Other | Source: Ambulatory Visit | Attending: Orthopedic Surgery | Admitting: Orthopedic Surgery

## 2021-08-30 VITALS — BP 137/80 | HR 65 | Temp 98.7°F | Resp 16 | Ht 66.0 in | Wt 195.0 lb

## 2021-08-30 DIAGNOSIS — Z01812 Encounter for preprocedural laboratory examination: Secondary | ICD-10-CM | POA: Diagnosis not present

## 2021-08-30 DIAGNOSIS — E119 Type 2 diabetes mellitus without complications: Secondary | ICD-10-CM | POA: Diagnosis not present

## 2021-08-30 DIAGNOSIS — Z01818 Encounter for other preprocedural examination: Secondary | ICD-10-CM

## 2021-08-30 HISTORY — DX: Cardiac arrhythmia, unspecified: I49.9

## 2021-08-30 HISTORY — DX: Family history of other specified conditions: Z84.89

## 2021-08-30 LAB — BASIC METABOLIC PANEL
Anion gap: 7 (ref 5–15)
BUN: 11 mg/dL (ref 8–23)
CO2: 29 mmol/L (ref 22–32)
Calcium: 9.3 mg/dL (ref 8.9–10.3)
Chloride: 107 mmol/L (ref 98–111)
Creatinine, Ser: 0.76 mg/dL (ref 0.44–1.00)
GFR, Estimated: >60 mL/min (ref >60)
Glucose, Bld: 104 mg/dL — ABNORMAL HIGH (ref 70–99)
Potassium: 4.9 mmol/L (ref 3.5–5.1)
Sodium: 143 mmol/L (ref 135–145)

## 2021-08-30 LAB — GLUCOSE, CAPILLARY: Glucose-Capillary: 98 mg/dL (ref 70–99)

## 2021-08-30 LAB — CBC
HCT: 41.9 % (ref 36.0–46.0)
Hemoglobin: 14.1 g/dL (ref 12.0–15.0)
MCH: 30.9 pg (ref 26.0–34.0)
MCHC: 33.7 g/dL (ref 30.0–36.0)
MCV: 91.7 fL (ref 80.0–100.0)
Platelets: 234 10*3/uL (ref 150–400)
RBC: 4.57 MIL/uL (ref 3.87–5.11)
RDW: 12.7 % (ref 11.5–15.5)
WBC: 4 10*3/uL (ref 4.0–10.5)
nRBC: 0 % (ref 0.0–0.2)

## 2021-08-30 LAB — SURGICAL PCR SCREEN
MRSA, PCR: NEGATIVE
Staphylococcus aureus: NEGATIVE

## 2021-08-31 LAB — HEMOGLOBIN A1C
Hgb A1c MFr Bld: 5.6 % (ref 4.8–5.6)
Mean Plasma Glucose: 114 mg/dL

## 2021-09-02 NOTE — Progress Notes (Signed)
Anesthesia Chart Review   Case: [id number] Date/Time: 09/12/21 1020   Procedure: TOTAL KNEE ARTHROPLASTY (Right: Knee)   Anesthesia type: Choice   Pre-op diagnosis: OA RIGHT KNEE   Location: [facility] ROOM 08 / [facility]   Surgeons: Willaim Sheng, MD       DISCUSSION:76 y.o. former smoker with h/o HTN, a-fib, mild to moderate mitral regurgitation, right knee OA scheduled for above procedure 09/12/2021 with Dr. Charlies Constable.   Surgery previously cancelled as pt found to be in a-fib.  She has since followed up with cardiology.  Admitted 05/10/2021 for dofetilide load, has remained in sinus rhythm since then.  Advised to hold Eliquis 3 days prior to surgery.   Anticipate pt can proceed with planned procedure barring acute status change.   VS: BP 137/80   Pulse 65   Temp 37.1 C (Oral)   Resp 16   Ht '5\' 6"'$  (1.676 m)   Wt 88.5 kg   SpO2 99%   BMI 31.47 kg/m   PROVIDERS: Isaac Bliss, Rayford Halsted, MD is PCP  Cardiologist - Phineas Inches, MD Electrophysiology-  Allegra Lai, MD  LABS: Labs reviewed: Acceptable for surgery. (all labs ordered are listed, but only abnormal results are displayed)  Labs Reviewed  BASIC METABOLIC PANEL - Abnormal; Notable for the following components:      Result Value   Glucose, Bld 104 (*)    All other components within normal limits  SURGICAL PCR SCREEN  HEMOGLOBIN A1C  CBC  GLUCOSE, CAPILLARY     IMAGES:   EKG: 06/20/2021 Rate 64 bpm   CV: Echo 02/01/2021  1. Left ventricular ejection fraction, by estimation, is 55 to 60%. The  left ventricle has normal function. The left ventricle has no regional  wall motion abnormalities. Left ventricular diastolic function could not  be evaluated.   2. Right ventricular systolic function is low normal. The right  ventricular size is normal. There is moderately elevated pulmonary artery  systolic pressure. The estimated right ventricular systolic pressure is  12.2 mmHg.   3. Left atrial size was  moderately dilated.   4. The mitral valve is grossly normal. Mild to moderate mitral valve  regurgitation.   5. The aortic valve is tricuspid. Aortic valve regurgitation is not  visualized.   6. The inferior vena cava is dilated in size with <50% respiratory  variability, suggesting right atrial pressure of 15 mmHg. Past Medical History:  Diagnosis Date   Arthritis    Dysrhythmia    A.fib   Family history of adverse reaction to anesthesia    Mother and sister have PONV   History of kidney stones    Hyperlipidemia    Hypertension    Hypothyroidism    Neuropathy    feet   Pre-diabetes    Primary localized osteoarthritis of right knee 01/05/2021    Past Surgical History:  Procedure Laterality Date   ABDOMINAL HYSTERECTOMY  2005   BREAST ENHANCEMENT SURGERY Bilateral 2000   BUNIONECTOMY Right 2013   CARDIOVERSION N/A 02/08/2021   Procedure: CARDIOVERSION;  Surgeon: Donato Heinz, MD;  Location: [facility];  Service: Cardiovascular;  Laterality: N/A;   CARDIOVERSION N/A 03/28/2021   Procedure: CARDIOVERSION;  Surgeon: Jerline Pain, MD;  Location: [facility];  Service: Cardiovascular;  Laterality: N/A;   CARDIOVERSION N/A 05/12/2021   Procedure: CARDIOVERSION;  Surgeon: Sanda Klein, MD;  Location: [facility];  Service: Cardiovascular;  Laterality: N/A;   [place] [facility]  01/01/2013   EXCISION MORTON'S NEUROMA Bilateral 1980   right foot, 2004   KNEE ARTHROSCOPY Right 2006   2012   PAROTID GLAND TUMOR EXCISION  2009   POLYPECTOMY     SPINAL CORD STIMULATOR IMPLANT Left 2012   TARSAL TUNNEL RELEASE Right 2005   TONSILLECTOMY      MEDICATIONS:  cetirizine (ZYRTEC) 10 MG tablet   diltiazem (CARDIZEM) 30 MG tablet   docusate sodium (COLACE) 100 MG capsule   dofetilide (TIKOSYN) 250 MCG capsule   ELIQUIS 5 MG TABS tablet   FIBER PO   furosemide (LASIX) 20 MG tablet   HYDROcodone-acetaminophen (NORCO/VICODIN) 5-325 MG tablet    levothyroxine (SYNTHROID, LEVOTHROID) 75 MCG tablet   MELATONIN PO   metoprolol succinate (TOPROL-XL) 25 MG 24 hr tablet   OVER THE COUNTER MEDICATION   potassium gluconate 595 (99 K) MG TABS tablet   pregabalin (LYRICA) 200 MG capsule   simvastatin (ZOCOR) 20 MG tablet   No current facility-administered medications for this encounter.    fentaNYL citrate (PF) (SUBLIMAZE) injection   Konrad Felix Ward, PA-C [facility] Testing [phone]

## 2021-09-02 NOTE — Anesthesia Preprocedure Evaluation (Addendum)
Anesthesia Evaluation  Patient identified by MRN, date of birth, ID band Patient awake    Reviewed: Allergy & Precautions, NPO status , Patient's Chart, lab work & pertinent test results  Airway Mallampati: II  TM Distance: >3 FB Neck ROM: Full    Dental no notable dental hx.    Pulmonary former smoker,    Pulmonary exam normal breath sounds clear to auscultation       Cardiovascular Exercise Tolerance: Good hypertension, Pt. on medications Normal cardiovascular exam+ dysrhythmias (on Eliquis; last dose of eliquis 09/09/21) Atrial Fibrillation  Rhythm:Regular Rate:Normal     Neuro/Psych  Neuromuscular disease (peripheral neuropathy) negative psych ROS   GI/Hepatic GERD  ,  Endo/Other  Hypothyroidism Pre-diabetes  Renal/GU   negative genitourinary   Musculoskeletal  (+) Arthritis  (chronic pain), Osteoarthritis,    Abdominal   Peds negative pediatric ROS (+)  Hematology   Anesthesia Other Findings   Reproductive/Obstetrics negative OB ROS                           Anesthesia Physical Anesthesia Plan  ASA: 3  Anesthesia Plan: Spinal and MAC   Post-op Pain Management: Tylenol PO (pre-op)*   Induction:   PONV Risk Score and Plan: 2 and Treatment may vary due to age or medical condition  Airway Management Planned: Natural Airway and Simple Face Mask  Additional Equipment: None  Intra-op Plan:   Post-operative Plan:   Informed Consent: I have reviewed the patients History and Physical, chart, labs and discussed the procedure including the risks, benefits and alternatives for the proposed anesthesia with the patient or authorized representative who has indicated his/her understanding and acceptance.       Plan Discussed with: CRNA and Anesthesiologist  Anesthesia Plan Comments: (Adductor canal block. Spinal. GETA as backup. Norton Blizzard, MD  )       Anesthesia Quick  Evaluation

## 2021-09-09 ENCOUNTER — Telehealth (HOSPITAL_COMMUNITY): Payer: Self-pay | Admitting: Physician Assistant

## 2021-09-09 NOTE — Telephone Encounter (Signed)
F/u reschedule

## 2021-09-09 NOTE — Telephone Encounter (Signed)
F/u

## 2021-09-12 ENCOUNTER — Encounter (HOSPITAL_COMMUNITY)
Admission: RE | Disposition: A | Discharge status: Home Health | Payer: Self-pay | Source: Ambulatory Visit | Attending: Orthopedic Surgery

## 2021-09-12 ENCOUNTER — Other Ambulatory Visit: Payer: Self-pay

## 2021-09-12 ENCOUNTER — Encounter (HOSPITAL_COMMUNITY): Payer: Self-pay | Admitting: Orthopedic Surgery

## 2021-09-12 ENCOUNTER — Observation Stay (HOSPITAL_COMMUNITY)
Admission: RE | Admit: 2021-09-12 | Discharge: 2021-09-13 | Disposition: A | Discharge status: Home Health | Payer: Medicare Other | Source: Ambulatory Visit | Attending: Orthopedic Surgery | Admitting: Orthopedic Surgery

## 2021-09-12 ENCOUNTER — Observation Stay (HOSPITAL_COMMUNITY): Payer: Medicare Other

## 2021-09-12 ENCOUNTER — Ambulatory Visit (HOSPITAL_COMMUNITY): Payer: Medicare Other | Admitting: Physician Assistant

## 2021-09-12 ENCOUNTER — Ambulatory Visit (HOSPITAL_BASED_OUTPATIENT_CLINIC_OR_DEPARTMENT_OTHER): Payer: Medicare Other | Admitting: Anesthesiology

## 2021-09-12 DIAGNOSIS — Z471 Aftercare following joint replacement surgery: Secondary | ICD-10-CM | POA: Diagnosis not present

## 2021-09-12 DIAGNOSIS — I10 Essential (primary) hypertension: Secondary | ICD-10-CM | POA: Insufficient documentation

## 2021-09-12 DIAGNOSIS — Z7901 Long term (current) use of anticoagulants: Secondary | ICD-10-CM | POA: Insufficient documentation

## 2021-09-12 DIAGNOSIS — E039 Hypothyroidism, unspecified: Secondary | ICD-10-CM | POA: Diagnosis not present

## 2021-09-12 DIAGNOSIS — Z87891 Personal history of nicotine dependence: Secondary | ICD-10-CM

## 2021-09-12 DIAGNOSIS — Z79890 Hormone replacement therapy (postmenopausal): Secondary | ICD-10-CM | POA: Diagnosis not present

## 2021-09-12 DIAGNOSIS — Z79899 Other long term (current) drug therapy: Secondary | ICD-10-CM | POA: Insufficient documentation

## 2021-09-12 DIAGNOSIS — M1711 Unilateral primary osteoarthritis, right knee: Secondary | ICD-10-CM

## 2021-09-12 DIAGNOSIS — E119 Type 2 diabetes mellitus without complications: Secondary | ICD-10-CM

## 2021-09-12 DIAGNOSIS — R600 Localized edema: Secondary | ICD-10-CM | POA: Diagnosis not present

## 2021-09-12 DIAGNOSIS — Z96651 Presence of right artificial knee joint: Secondary | ICD-10-CM | POA: Diagnosis not present

## 2021-09-12 HISTORY — PX: TOTAL KNEE ARTHROPLASTY: SHX125

## 2021-09-12 SURGERY — ARTHROPLASTY, KNEE, TOTAL
Anesthesia: Monitor Anesthesia Care | Site: Knee | Laterality: Right

## 2021-09-12 MED ORDER — LIDOCAINE HCL (PF) 2 % IJ SOLN
INTRAMUSCULAR | Status: AC
  Filled 2021-09-12: qty 5

## 2021-09-12 MED ORDER — ACETAMINOPHEN 500 MG PO TABS
1000.0000 mg | ORAL_TABLET | Freq: Four times a day (QID) | ORAL | Status: AC
  Administered 2021-09-12 – 2021-09-13 (×4): 1000 mg via ORAL
  Filled 2021-09-12 (×4): qty 2

## 2021-09-12 MED ORDER — DOCUSATE SODIUM 100 MG PO CAPS
100.0000 mg | ORAL_CAPSULE | Freq: Two times a day (BID) | ORAL | Status: DC
  Administered 2021-09-12 – 2021-09-13 (×2): 100 mg via ORAL
  Filled 2021-09-12 (×2): qty 1

## 2021-09-12 MED ORDER — METHOCARBAMOL 1000 MG/10ML IJ SOLN: 500.0000 mg | Freq: Four times a day (QID) | INTRAVENOUS | Status: DC | PRN

## 2021-09-12 MED ORDER — BUPIVACAINE LIPOSOME 1.3 % IJ SUSP
INTRAMUSCULAR | Status: AC
  Filled 2021-09-12: qty 20

## 2021-09-12 MED ORDER — APIXABAN 2.5 MG PO TABS
2.5000 mg | ORAL_TABLET | Freq: Two times a day (BID) | ORAL | Status: DC
  Administered 2021-09-13: 2.5 mg via ORAL
  Filled 2021-09-12: qty 1

## 2021-09-12 MED ORDER — EPHEDRINE 5 MG/ML INJ
INTRAVENOUS | Status: AC
Start: 2021-09-12 — End: ?
  Filled 2021-09-12: qty 5

## 2021-09-12 MED ORDER — METOPROLOL SUCCINATE ER 25 MG PO TB24
12.5000 mg | ORAL_TABLET | Freq: Every day | ORAL | Status: DC
  Administered 2021-09-13: 12.5 mg via ORAL
  Filled 2021-09-12: qty 1

## 2021-09-12 MED ORDER — ONDANSETRON HCL 4 MG PO TABS: 4.0000 mg | ORAL_TABLET | Freq: Four times a day (QID) | ORAL | Status: DC | PRN

## 2021-09-12 MED ORDER — MENTHOL 3 MG MT LOZG
1.0000 | LOZENGE | OROMUCOSAL | Status: DC | PRN
Start: 2021-09-12 — End: 2021-09-13
  Administered 2021-09-12: 3 mg via ORAL
  Filled 2021-09-12: qty 9

## 2021-09-12 MED ORDER — FENTANYL CITRATE PF 50 MCG/ML IJ SOSY: 25.0000 ug | PREFILLED_SYRINGE | INTRAMUSCULAR | Status: DC | PRN

## 2021-09-12 MED ORDER — LACTATED RINGERS IV SOLN: INTRAVENOUS | Status: DC

## 2021-09-12 MED ORDER — KETOROLAC TROMETHAMINE 15 MG/ML IJ SOLN
7.5000 mg | Freq: Four times a day (QID) | INTRAMUSCULAR | Status: AC
  Administered 2021-09-12 – 2021-09-13 (×4): 7.5 mg via INTRAVENOUS
  Filled 2021-09-12 (×3): qty 1

## 2021-09-12 MED ORDER — POVIDONE-IODINE 10 % EX SWAB
2.0000 | Freq: Once | CUTANEOUS | Status: AC
  Administered 2021-09-12: 2 via TOPICAL

## 2021-09-12 MED ORDER — BUPIVACAINE LIPOSOME 1.3 % IJ SUSP: 20.0000 mL | Freq: Once | INTRAMUSCULAR | Status: DC

## 2021-09-12 MED ORDER — ONDANSETRON HCL 4 MG/2ML IJ SOLN
INTRAMUSCULAR | Status: DC | PRN
  Administered 2021-09-12: 4 mg via INTRAVENOUS

## 2021-09-12 MED ORDER — ISOPROPYL ALCOHOL 70 % SOLN
Status: DC | PRN
  Administered 2021-09-12: 1 via TOPICAL

## 2021-09-12 MED ORDER — DIPHENHYDRAMINE HCL 12.5 MG/5ML PO ELIX
12.5000 mg | ORAL_SOLUTION | ORAL | Status: DC | PRN
  Administered 2021-09-12: 25 mg via ORAL
  Filled 2021-09-12: qty 10

## 2021-09-12 MED ORDER — GLYCOPYRROLATE 0.2 MG/ML IJ SOLN
INTRAMUSCULAR | Status: AC
  Filled 2021-09-12: qty 1

## 2021-09-12 MED ORDER — SIMVASTATIN 20 MG PO TABS
20.0000 mg | ORAL_TABLET | Freq: Every day | ORAL | Status: DC
  Administered 2021-09-12: 20 mg via ORAL
  Filled 2021-09-12: qty 1

## 2021-09-12 MED ORDER — CHLORHEXIDINE GLUCONATE 0.12 % MT SOLN
15.0000 mL | Freq: Once | OROMUCOSAL | Status: AC
  Administered 2021-09-12: 15 mL via OROMUCOSAL

## 2021-09-12 MED ORDER — CEFAZOLIN SODIUM-DEXTROSE 2-4 GM/100ML-% IV SOLN
2.0000 g | Freq: Four times a day (QID) | INTRAVENOUS | Status: AC
  Administered 2021-09-12 (×2): 2 g via INTRAVENOUS
  Filled 2021-09-12 (×2): qty 100

## 2021-09-12 MED ORDER — 0.9 % SODIUM CHLORIDE (POUR BTL) OPTIME
TOPICAL | Status: DC | PRN
  Administered 2021-09-12: 1000 mL

## 2021-09-12 MED ORDER — CEFAZOLIN SODIUM-DEXTROSE 2-4 GM/100ML-% IV SOLN
2.0000 g | INTRAVENOUS | Status: AC
  Administered 2021-09-12: 2 g via INTRAVENOUS
  Filled 2021-09-12: qty 100

## 2021-09-12 MED ORDER — FENTANYL CITRATE PF 50 MCG/ML IJ SOSY
50.0000 ug | PREFILLED_SYRINGE | Freq: Once | INTRAMUSCULAR | Status: AC
  Administered 2021-09-12: 50 ug via INTRAVENOUS
  Filled 2021-09-12: qty 2

## 2021-09-12 MED ORDER — PHENOL 1.4 % MT LIQD: 1.0000 | OROMUCOSAL | Status: DC | PRN

## 2021-09-12 MED ORDER — BUPIVACAINE IN DEXTROSE 0.75-8.25 % IT SOLN
INTRATHECAL | Status: DC | PRN
  Administered 2021-09-12: 1.8 mL via INTRATHECAL

## 2021-09-12 MED ORDER — METHOCARBAMOL 500 MG PO TABS: 500.0000 mg | ORAL_TABLET | Freq: Four times a day (QID) | ORAL | Status: DC | PRN

## 2021-09-12 MED ORDER — POLYETHYLENE GLYCOL 3350 17 G PO PACK: 17.0000 g | PACK | Freq: Every day | ORAL | Status: DC | PRN

## 2021-09-12 MED ORDER — PHENYLEPHRINE 80 MCG/ML (10ML) SYRINGE FOR IV PUSH (FOR BLOOD PRESSURE SUPPORT)
PREFILLED_SYRINGE | INTRAVENOUS | Status: AC
  Filled 2021-09-12: qty 10

## 2021-09-12 MED ORDER — SODIUM CHLORIDE (PF) 0.9 % IJ SOLN
INTRAMUSCULAR | Status: AC
  Filled 2021-09-12: qty 50

## 2021-09-12 MED ORDER — ONDANSETRON HCL 4 MG/2ML IJ SOLN
INTRAMUSCULAR | Status: AC
Start: 2021-09-12 — End: ?
  Filled 2021-09-12: qty 2

## 2021-09-12 MED ORDER — OXYCODONE HCL 5 MG PO TABS
5.0000 mg | ORAL_TABLET | ORAL | Status: DC | PRN
  Administered 2021-09-12 (×2): 5 mg via ORAL
  Administered 2021-09-12 – 2021-09-13 (×4): 10 mg via ORAL
  Filled 2021-09-12: qty 2
  Filled 2021-09-12: qty 1
  Filled 2021-09-12 (×3): qty 2
  Filled 2021-09-12: qty 1

## 2021-09-12 MED ORDER — DOFETILIDE 250 MCG PO CAPS
250.0000 ug | ORAL_CAPSULE | Freq: Two times a day (BID) | ORAL | Status: DC
  Administered 2021-09-12 – 2021-09-13 (×2): 250 ug via ORAL
  Filled 2021-09-12 (×2): qty 1

## 2021-09-12 MED ORDER — TRANEXAMIC ACID-NACL 1000-0.7 MG/100ML-% IV SOLN
1000.0000 mg | INTRAVENOUS | Status: AC
  Administered 2021-09-12: 1000 mg via INTRAVENOUS
  Filled 2021-09-12: qty 100

## 2021-09-12 MED ORDER — ZOLPIDEM TARTRATE 5 MG PO TABS: 5.0000 mg | ORAL_TABLET | Freq: Every evening | ORAL | Status: DC | PRN

## 2021-09-12 MED ORDER — OXYCODONE HCL 5 MG/5ML PO SOLN: 5.0000 mg | Freq: Once | ORAL | Status: DC | PRN

## 2021-09-12 MED ORDER — SODIUM CHLORIDE (PF) 0.9 % IJ SOLN
INTRAMUSCULAR | Status: AC
  Filled 2021-09-12: qty 10

## 2021-09-12 MED ORDER — ONDANSETRON HCL 4 MG/2ML IJ SOLN
4.0000 mg | Freq: Once | INTRAMUSCULAR | Status: DC | PRN
Start: 2021-09-12 — End: 2021-09-12

## 2021-09-12 MED ORDER — CELECOXIB 200 MG PO CAPS
400.0000 mg | ORAL_CAPSULE | Freq: Once | ORAL | Status: AC
  Administered 2021-09-12: 400 mg via ORAL
  Filled 2021-09-12: qty 2

## 2021-09-12 MED ORDER — PROPOFOL 10 MG/ML IV BOLUS
INTRAVENOUS | Status: AC
Start: 2021-09-12 — End: ?
  Filled 2021-09-12: qty 20

## 2021-09-12 MED ORDER — ACETAMINOPHEN 325 MG PO TABS: 325.0000 mg | ORAL_TABLET | Freq: Four times a day (QID) | ORAL | Status: DC | PRN

## 2021-09-12 MED ORDER — BUPIVACAINE LIPOSOME 1.3 % IJ SUSP
INTRAMUSCULAR | Status: DC | PRN
  Administered 2021-09-12: 20 mL

## 2021-09-12 MED ORDER — WATER FOR IRRIGATION, STERILE IR SOLN
Status: DC | PRN
  Administered 2021-09-12: 2000 mL

## 2021-09-12 MED ORDER — ONDANSETRON HCL 4 MG/2ML IJ SOLN: 4.0000 mg | Freq: Four times a day (QID) | INTRAMUSCULAR | Status: DC | PRN

## 2021-09-12 MED ORDER — PREGABALIN 100 MG PO CAPS
200.0000 mg | ORAL_CAPSULE | Freq: Two times a day (BID) | ORAL | Status: DC
  Administered 2021-09-12 – 2021-09-13 (×2): 200 mg via ORAL
  Filled 2021-09-12 (×2): qty 2

## 2021-09-12 MED ORDER — POVIDONE-IODINE 10 % EX SWAB: 2.0000 | Freq: Once | CUTANEOUS | Status: DC

## 2021-09-12 MED ORDER — MIDAZOLAM HCL 2 MG/2ML IJ SOLN
1.0000 mg | Freq: Once | INTRAMUSCULAR | Status: DC
  Filled 2021-09-12: qty 2

## 2021-09-12 MED ORDER — ACETAMINOPHEN 500 MG PO TABS
1000.0000 mg | ORAL_TABLET | Freq: Once | ORAL | Status: AC
  Administered 2021-09-12: 1000 mg via ORAL
  Filled 2021-09-12: qty 2

## 2021-09-12 MED ORDER — SODIUM CHLORIDE 0.9% FLUSH
INTRAVENOUS | Status: DC | PRN
  Administered 2021-09-12: 60 mL

## 2021-09-12 MED ORDER — PHENYLEPHRINE 80 MCG/ML (10ML) SYRINGE FOR IV PUSH (FOR BLOOD PRESSURE SUPPORT)
PREFILLED_SYRINGE | INTRAVENOUS | Status: DC | PRN
  Administered 2021-09-12: 40 ug via INTRAVENOUS
  Administered 2021-09-12: 80 ug via INTRAVENOUS
  Administered 2021-09-12: 160 ug via INTRAVENOUS

## 2021-09-12 MED ORDER — PROPOFOL 500 MG/50ML IV EMUL
INTRAVENOUS | Status: DC | PRN
  Administered 2021-09-12: 75 ug/kg/min via INTRAVENOUS
  Administered 2021-09-12: 30 mg via INTRAVENOUS

## 2021-09-12 MED ORDER — PROPOFOL 1000 MG/100ML IV EMUL
INTRAVENOUS | Status: AC
  Filled 2021-09-12: qty 100

## 2021-09-12 MED ORDER — FENTANYL CITRATE (PF) 100 MCG/2ML IJ SOLN
INTRAMUSCULAR | Status: AC
  Filled 2021-09-12: qty 2

## 2021-09-12 MED ORDER — PANTOPRAZOLE SODIUM 40 MG PO TBEC
40.0000 mg | DELAYED_RELEASE_TABLET | Freq: Every day | ORAL | Status: DC
  Administered 2021-09-12 – 2021-09-13 (×2): 40 mg via ORAL
  Filled 2021-09-12 (×2): qty 1

## 2021-09-12 MED ORDER — FUROSEMIDE 20 MG PO TABS
20.0000 mg | ORAL_TABLET | Freq: Every day | ORAL | Status: DC | PRN
Start: 2021-09-13 — End: 2021-09-13

## 2021-09-12 MED ORDER — SODIUM CHLORIDE 0.9 % IR SOLN
Status: DC | PRN
  Administered 2021-09-12: 3000 mL

## 2021-09-12 MED ORDER — LEVOTHYROXINE SODIUM 75 MCG PO TABS
75.0000 ug | ORAL_TABLET | Freq: Every day | ORAL | Status: DC
  Administered 2021-09-13: 75 ug via ORAL
  Filled 2021-09-12: qty 1

## 2021-09-12 MED ORDER — OXYCODONE HCL 5 MG PO TABS: 5.0000 mg | ORAL_TABLET | Freq: Once | ORAL | Status: DC | PRN

## 2021-09-12 MED ORDER — ORAL CARE MOUTH RINSE: 15.0000 mL | Freq: Once | OROMUCOSAL | Status: AC

## 2021-09-12 MED ORDER — AMISULPRIDE (ANTIEMETIC) 5 MG/2ML IV SOLN
10.0000 mg | Freq: Once | INTRAVENOUS | Status: DC | PRN
Start: 2021-09-12 — End: 2021-09-12

## 2021-09-12 MED ORDER — DILTIAZEM HCL 30 MG PO TABS
30.0000 mg | ORAL_TABLET | ORAL | Status: DC | PRN
  Filled 2021-09-12: qty 1

## 2021-09-12 MED ORDER — APIXABAN 5 MG PO TABS: 5.0000 mg | ORAL_TABLET | Freq: Two times a day (BID) | ORAL | Status: DC

## 2021-09-12 MED ORDER — MIDAZOLAM HCL 2 MG/2ML IJ SOLN
INTRAMUSCULAR | Status: AC
  Filled 2021-09-12: qty 2

## 2021-09-12 MED ORDER — PHENYLEPHRINE HCL-NACL 20-0.9 MG/250ML-% IV SOLN
INTRAVENOUS | Status: DC | PRN
  Administered 2021-09-12: 20 ug/min via INTRAVENOUS

## 2021-09-12 MED ORDER — HYDROMORPHONE HCL 1 MG/ML IJ SOLN
0.5000 mg | INTRAMUSCULAR | Status: DC | PRN
  Administered 2021-09-12: 1 mg via INTRAVENOUS
  Filled 2021-09-12: qty 1

## 2021-09-12 MED ORDER — EPHEDRINE SULFATE-NACL 50-0.9 MG/10ML-% IV SOSY
PREFILLED_SYRINGE | INTRAVENOUS | Status: DC | PRN
  Administered 2021-09-12 (×2): 5 mg via INTRAVENOUS

## 2021-09-12 MED ORDER — GLYCOPYRROLATE 0.2 MG/ML IJ SOLN
INTRAMUSCULAR | Status: DC | PRN
  Administered 2021-09-12 (×2): .1 mg via INTRAVENOUS

## 2021-09-12 SURGICAL SUPPLY — 64 items
ADH SKN CLS APL DERMABOND .7 (GAUZE/BANDAGES/DRESSINGS) ×1
APL PRP STRL LF DISP 70% ISPRP (MISCELLANEOUS) ×2
BAG COUNTER SPONGE SURGICOUNT (BAG) IMPLANT
BAG SPNG CNTER NS LX DISP (BAG)
BLADE SAG 18X100X1.27 (BLADE) ×2 IMPLANT
BLADE SAW SAG 35X64 .89 (BLADE) ×2 IMPLANT
BNDG CMPR 5X3 CHSV STRCH STRL (GAUZE/BANDAGES/DRESSINGS) ×1
BNDG CMPR MED 10X6 ELC LF (GAUZE/BANDAGES/DRESSINGS) ×1
BNDG COHESIVE 3X5 TAN ST LF (GAUZE/BANDAGES/DRESSINGS) ×2 IMPLANT
BNDG ELASTIC 6X10 VLCR STRL LF (GAUZE/BANDAGES/DRESSINGS) ×2 IMPLANT
BOWL SMART MIX CTS (DISPOSABLE) ×2 IMPLANT
BSPLAT TIB 5D F CMNT STM RT (Knees) ×1 IMPLANT
CEMENT BONE R 1X40 (Cement) ×2 IMPLANT
CHLORAPREP W/TINT 26 (MISCELLANEOUS) ×4 IMPLANT
COMP FEM KNEE NRW SZ9 RT (Joint) ×2 IMPLANT
COMPONENT FEM KNEE NRW SZ9 RT (Joint) IMPLANT
COVER SURGICAL LIGHT HANDLE (MISCELLANEOUS) ×2 IMPLANT
CUFF TOURN SGL QUICK 34 (TOURNIQUET CUFF) ×2
CUFF TRNQT CYL 34X4.125X (TOURNIQUET CUFF) ×1 IMPLANT
DERMABOND ADVANCED (GAUZE/BANDAGES/DRESSINGS) ×1
DERMABOND ADVANCED .7 DNX12 (GAUZE/BANDAGES/DRESSINGS) ×1 IMPLANT
DRAPE INCISE IOBAN 85X60 (DRAPES) ×2 IMPLANT
DRAPE SHEET LG 3/4 BI-LAMINATE (DRAPES) ×2 IMPLANT
DRAPE U-SHAPE 47X51 STRL (DRAPES) ×2 IMPLANT
DRESSING AQUACEL AG SP 3.5X10 (GAUZE/BANDAGES/DRESSINGS) ×1 IMPLANT
DRSG AQUACEL AG SP 3.5X10 (GAUZE/BANDAGES/DRESSINGS) ×2
ELECT REM PT RETURN 15FT ADLT (MISCELLANEOUS) ×2 IMPLANT
GAUZE SPONGE 4X4 12PLY STRL (GAUZE/BANDAGES/DRESSINGS) ×2 IMPLANT
GLOVE BIOGEL PI IND STRL 8 (GLOVE) ×1 IMPLANT
GLOVE BIOGEL PI INDICATOR 8 (GLOVE) ×1
GLOVE SURG ORTHO 8.0 STRL STRW (GLOVE) ×4 IMPLANT
GOWN STRL REUS W/ TWL XL LVL3 (GOWN DISPOSABLE) ×1 IMPLANT
GOWN STRL REUS W/TWL XL LVL3 (GOWN DISPOSABLE) ×2
HANDPIECE INTERPULSE COAX TIP (DISPOSABLE) ×2
HDLS TROCR DRIL PIN KNEE 75 (PIN) ×2
HOLDER FOLEY CATH W/STRAP (MISCELLANEOUS) ×2 IMPLANT
HOOD PEEL AWAY FLYTE STAYCOOL (MISCELLANEOUS) ×6 IMPLANT
INSERT TIB ARTISURF SZ8-11X12 (Insert) ×1 IMPLANT
MANIFOLD NEPTUNE II (INSTRUMENTS) ×2 IMPLANT
MARKER SKIN DUAL TIP RULER LAB (MISCELLANEOUS) ×2 IMPLANT
NS IRRIG 1000ML POUR BTL (IV SOLUTION) ×2 IMPLANT
PACK TOTAL KNEE CUSTOM (KITS) ×2 IMPLANT
PIN DRILL HDLS TROCAR 75 4PK (PIN) IMPLANT
PROTECTOR NERVE ULNAR (MISCELLANEOUS) ×2 IMPLANT
SCREW HEADED 33MM KNEE (MISCELLANEOUS) ×2 IMPLANT
SET HNDPC FAN SPRY TIP SCT (DISPOSABLE) ×1 IMPLANT
SOLUTION IRRIG SURGIPHOR (IV SOLUTION) IMPLANT
SPIKE FLUID TRANSFER (MISCELLANEOUS) ×2 IMPLANT
STEM POLY PAT PLY 32M KNEE (Knees) ×1 IMPLANT
STEM TIBIA 5 DEG SZ F R KNEE (Knees) IMPLANT
STRIP CLOSURE SKIN 1/2X4 (GAUZE/BANDAGES/DRESSINGS) ×2 IMPLANT
SUT MNCRL AB 3-0 PS2 18 (SUTURE) ×2 IMPLANT
SUT STRATAFIX 0 PDS 27 VIOLET (SUTURE) ×2
SUT STRATAFIX PDO 1 14 VIOLET (SUTURE) ×2
SUT STRATFX PDO 1 14 VIOLET (SUTURE) ×1
SUT VIC AB 2-0 CT2 27 (SUTURE) ×4 IMPLANT
SUTURE STRATFX 0 PDS 27 VIOLET (SUTURE) ×1 IMPLANT
SUTURE STRATFX PDO 1 14 VIOLET (SUTURE) ×1 IMPLANT
SYR 50ML LL SCALE MARK (SYRINGE) ×2 IMPLANT
TIBIA STEM 5 DEG SZ F R KNEE (Knees) ×2 IMPLANT
TRAY FOLEY MTR SLVR 14FR STAT (SET/KITS/TRAYS/PACK) IMPLANT
TUBE SUCTION HIGH CAP CLEAR NV (SUCTIONS) ×2 IMPLANT
UNDERPAD 30X36 HEAVY ABSORB (UNDERPADS AND DIAPERS) ×2 IMPLANT
WRAP KNEE MAXI GEL POST OP (GAUZE/BANDAGES/DRESSINGS) ×1 IMPLANT

## 2021-09-12 NOTE — Discharge Instructions (Signed)
INSTRUCTIONS AFTER JOINT REPLACEMENT   Remove items at home which could result in a fall. This includes throw rugs or furniture in walking pathways ICE to the affected joint every three hours while awake for 30 minutes at a time, for at least the first 3-5 days, and then as needed for pain and swelling.  Continue to use ice for pain and swelling. You may notice swelling that will progress down to the foot and ankle.  This is normal after surgery.  Elevate your leg when you are not up walking on it.   Continue to use the breathing machine you got in the hospital (incentive spirometer) which will help keep your temperature down.  It is common for your temperature to cycle up and down following surgery, especially at night when you are not up moving around and exerting yourself.  The breathing machine keeps your lungs expanded and your temperature down.   DIET:  As you were doing prior to hospitalization, we recommend a well-balanced diet.  DRESSING / WOUND CARE / SHOWERING  Keep the surgical dressing until follow up.  The dressing is water proof, so you can shower without any extra covering.  IF THE DRESSING FALLS OFF or the wound gets wet inside, change the dressing with sterile gauze.  Please use good hand washing techniques before changing the dressing.  Do not use any lotions or creams on the incision until instructed by your surgeon.    ACTIVITY  Increase activity slowly as tolerated, but follow the weight bearing instructions below.   No driving for 6 weeks or until further direction given by your physician.  You cannot drive while taking narcotics.  No lifting or carrying greater than 10 lbs. until further directed by your surgeon. Avoid periods of inactivity such as sitting longer than an hour when not asleep. This helps prevent blood clots.  You may return to work once you are authorized by your doctor.     WEIGHT BEARING   Weight bearing as tolerated with assist device (walker, cane,  etc) as directed, use it as long as suggested by your surgeon or therapist, typically at least 4-6 weeks.   EXERCISES  Results after joint replacement surgery are often greatly improved when you follow the exercise, range of motion and muscle strengthening exercises prescribed by your doctor. Safety measures are also important to protect the joint from further injury. Any time any of these exercises cause you to have increased pain or swelling, decrease what you are doing until you are comfortable again and then slowly increase them. If you have problems or questions, call your caregiver or physical therapist for advice.   Rehabilitation is important following a joint replacement. After just a few days of immobilization, the muscles of the leg can become weakened and shrink (atrophy).  These exercises are designed to build up the tone and strength of the thigh and leg muscles and to improve motion. Often times heat used for twenty to thirty minutes before working out will loosen up your tissues and help with improving the range of motion but do not use heat for the first two weeks following surgery (sometimes heat can increase post-operative swelling).   These exercises can be done on a training (exercise) mat, on the floor, on a table or on a bed. Use whatever works the best and is most comfortable for you.    Use music or television while you are exercising so that the exercises are a pleasant break in your   day. This will make your life better with the exercises acting as a break in your routine that you can look forward to.   Perform all exercises about fifteen times, three times per day or as directed.  You should exercise both the operative leg and the other leg as well.  Exercises include:   Quad Sets - Tighten up the muscle on the front of the thigh (Quad) and hold for 5-10 seconds.   Straight Leg Raises - With your knee straight (if you were given a brace, keep it on), lift the leg to 60  degrees, hold for 3 seconds, and slowly lower the leg.  Perform this exercise against resistance later as your leg gets stronger.  Leg Slides: Lying on your back, slowly slide your foot toward your buttocks, bending your knee up off the floor (only go as far as is comfortable). Then slowly slide your foot back down until your leg is flat on the floor again.  Angel Wings: Lying on your back spread your legs to the side as far apart as you can without causing discomfort.  Hamstring Strength:  Lying on your back, push your heel against the floor with your leg straight by tightening up the muscles of your buttocks.  Repeat, but this time bend your knee to a comfortable angle, and push your heel against the floor.  You may put a pillow under the heel to make it more comfortable if necessary.   A rehabilitation program following joint replacement surgery can speed recovery and prevent re-injury in the future due to weakened muscles. Contact your doctor or a physical therapist for more information on knee rehabilitation.    CONSTIPATION  Constipation is defined medically as fewer than three stools per week and severe constipation as less than one stool per week.  Even if you have a regular bowel pattern at home, your normal regimen is likely to be disrupted due to multiple reasons following surgery.  Combination of anesthesia, postoperative narcotics, change in appetite and fluid intake all can affect your bowels.   YOU MUST use at least one of the following options; they are listed in order of increasing strength to get the job done.  They are all available over the counter, and you may need to use some, POSSIBLY even all of these options:    Drink plenty of fluids (prune juice may be helpful) and high fiber foods Colace 100 mg by mouth twice a day  Senokot for constipation as directed and as needed Dulcolax (bisacodyl), take with full glass of water  Miralax (polyethylene glycol) once or twice a day as  needed.  If you have tried all these things and are unable to have a bowel movement in the first 3-4 days after surgery call either your surgeon or your primary doctor.    If you experience loose stools or diarrhea, hold the medications until you stool forms back up.  If your symptoms do not get better within 1 week or if they get worse, check with your doctor.  If you experience "the worst abdominal pain ever" or develop nausea or vomiting, please contact the office immediately for further recommendations for treatment.   ITCHING:  If you experience itching with your medications, try taking only a single pain pill, or even half a pain pill at a time.  You can also use Benadryl over the counter for itching or also to help with sleep.   TED HOSE STOCKINGS:  Use stockings on both   legs until for at least 2 weeks or as directed by physician office. They may be removed at night for sleeping.  MEDICATIONS:  See your medication summary on the "After Visit Summary" that nursing will review with you.  You may have some home medications which will be placed on hold until you complete the course of blood thinner medication.  It is important for you to complete the blood thinner medication as prescribed.   Blood clot prevention (DVT Prophylaxis): After surgery you are at an increased risk for a blood clot. you will resume eliquis 2.'5mg'$  twice daily for day 1 and day 2 after surgery. Then resume your full dose eliquis on the th third day after surgery. This will help reduce your risk of getting a blood clot. This will help prevent a blood clot. Signs of a pulmonary embolus (blood clot in the lungs) include sudden short of breath, feeling lightheaded or dizzy, chest pain with a deep breath, rapid pulse rapid breathing. Signs of a blood clot in your arms or legs include new unexplained swelling and cramping, warm, red or darkened skin around the painful area. Please call the office or 911 right away if these signs or  symptoms develop.  PRECAUTIONS:  If you experience chest pain or shortness of breath - call 911 immediately for transfer to the hospital emergency department.   If you develop a fever greater that 101 F, purulent drainage from wound, increased redness or drainage from wound, foul odor from the wound/dressing, or calf pain - CONTACT YOUR SURGEON.                                                   FOLLOW-UP APPOINTMENTS:  If you do not already have a post-op appointment, please call the office for an appointment to be seen by your surgeon.  Guidelines for how soon to be seen are listed in your "After Visit Summary", but are typically between 2-3 weeks after surgery.    POST-OPERATIVE OPIOID TAPER INSTRUCTIONS: It is important to wean off of your opioid medication as soon as possible. If you do not need pain medication after your surgery it is ok to stop day one. Opioids include: Codeine, Hydrocodone(Norco, Vicodin), Oxycodone(Percocet, oxycontin) and hydromorphone amongst others.  Long term and even short term use of opiods can cause: Increased pain response Dependence Constipation Depression Respiratory depression And more.  Withdrawal symptoms can include Flu like symptoms Nausea, vomiting And more Techniques to manage these symptoms Hydrate well Eat regular healthy meals Stay active Use relaxation techniques(deep breathing, meditating, yoga) Do Not substitute Alcohol to help with tapering If you have been on opioids for less than two weeks and do not have pain than it is ok to stop all together.  Plan to wean off of opioids This plan should start within one week post op of your joint replacement. Maintain the same interval or time between taking each dose and first decrease the dose.  Cut the total daily intake of opioids by one tablet each day Next start to increase the time between doses. The last dose that should be eliminated is the evening dose.   MAKE SURE YOU:   Understand these instructions.  Get help right away if you are not doing well or get worse.    Thank you for letting us be a part of  your medical care team.  It is a privilege we respect greatly.  We hope these instructions will help you stay on track for a fast and full recovery!

## 2021-09-12 NOTE — Progress Notes (Signed)
AssistedDr. Elgie Congo with right, adductor canal, ultrasound guided block. Side rails up, monitors on throughout procedure. See vital signs in flow sheet. Tolerated Procedure well.

## 2021-09-12 NOTE — Anesthesia Postprocedure Evaluation (Signed)
Anesthesia Post Note  Patient: Brandi Spence  Procedure(s) Performed: TOTAL KNEE ARTHROPLASTY (Right: Knee)     Patient location during evaluation: PACU Anesthesia Type: MAC and Spinal Level of consciousness: awake and alert Pain management: pain level controlled Vital Signs Assessment: post-procedure vital signs reviewed and stable Respiratory status: spontaneous breathing and respiratory function stable Cardiovascular status: stable Postop Assessment: no apparent nausea or vomiting Anesthetic complications: no   No notable events documented.  Last Vitals:  Vitals:   09/12/21 1345 09/12/21 1400  BP: (!) 106/58 107/60  Pulse: (!) 46 (!) 47  Resp: 12 13  Temp:  (!) 36 C  SpO2: 95% 96%    Last Pain:  Vitals:   09/12/21 1400  PainSc: 0-No pain                 Candra R Yarelie Hams

## 2021-09-12 NOTE — Anesthesia Procedure Notes (Signed)
Spinal  Patient location during procedure: OR Start time: 09/12/2021 10:15 AM End time: 09/12/2021 10:22 AM Reason for block: surgical anesthesia Staffing Performed: anesthesiologist  Anesthesiologist: Merlinda Frederick, MD Performed by: Merlinda Frederick, MD Authorized by: Merlinda Frederick, MD   Preanesthetic Checklist Completed: patient identified, IV checked, risks and benefits discussed, surgical consent, monitors and equipment checked, pre-op evaluation and timeout performed Spinal Block Patient position: sitting Prep: DuraPrep Patient monitoring: cardiac monitor, continuous pulse ox and blood pressure Approach: midline Location: L2-3 Injection technique: single-shot Needle Needle type: Pencan  Needle gauge: 24 G Needle length: 9 cm Assessment Sensory level: T4 Events: CSF return Additional Notes Functioning IV was confirmed and monitors were applied. Sterile prep and drape, including hand hygiene and sterile gloves were used. The patient was positioned and the spine was prepped. The skin was anesthetized with lidocaine.  Free flow of clear CSF was obtained prior to injecting local anesthetic into the CSF.  The spinal needle aspirated freely following injection.  The needle was carefully withdrawn.  The patient tolerated the procedure well.

## 2021-09-12 NOTE — Transfer of Care (Signed)
Immediate Anesthesia Transfer of Care Note  Patient: Brandi Spence  Procedure(s) Performed: TOTAL KNEE ARTHROPLASTY (Right: Knee)  Patient Location: PACU  Anesthesia Type:Regional and Spinal  Level of Consciousness: drowsy  Airway & Oxygen Therapy: Patient Spontanous Breathing  Post-op Assessment: Report given to RN and Post -op Vital signs reviewed and stable  Post vital signs: Reviewed and stable  Last Vitals:  Vitals Value Taken Time  BP 102/68 09/12/21 1300  Temp    Pulse 49 09/12/21 1305  Resp 11 09/12/21 1305  SpO2 96 % 09/12/21 1305  Vitals shown include unvalidated device data.  Last Pain:  Vitals:   09/12/21 0921  PainSc: 0-No pain         Complications: No notable events documented.

## 2021-09-12 NOTE — Care Plan (Signed)
Ortho Bundle Case Management Note  Patient Details  Name: Brandi Spence MRN: [id number] Date of Birth: 05/15/1945  Met with patient in the office prior to surgery. She will discharge to home with family to assist. Has RW at home. HHPT referral to [facility] and [facility] set up with [facility]. Patient and MD in agreement with plan. Choice offered                     DME Arranged:    DME Agency:     HH Arranged:  PT HH Agency:  [facility]  Additional Comments: Please contact me with any questions of if this plan should need to change.  Ladell Heads,  [facility]  [phone] 09/12/2021, 9:28 AM

## 2021-09-12 NOTE — Plan of Care (Signed)
  Problem: Nutrition: Goal: Adequate nutrition will be maintained Outcome: Progressing   Problem: Elimination: Goal: Will not experience complications related to urinary retention Outcome: Progressing   

## 2021-09-12 NOTE — Op Note (Signed)
DATE OF SURGERY:  09/12/2021 TIME: 12:07 PM  PATIENT NAME:  Brandi Spence   AGE: 76 y.o.    PRE-OPERATIVE DIAGNOSIS: End-stage right knee osteoarthritis  POST-OPERATIVE DIAGNOSIS:  Same  PROCEDURE: Right total Knee Arthroplasty  SURGEON:  Quinn Bartling A Maxen Rowland, MD   ASSISTANT: Izola Price, RNFA, present and scrubbed throughout the case, critical for assistance with exposure, retraction, instrumentation, and closure.   OPERATIVE IMPLANTS:  Cemented Zimmer persona 9 right femur narrow, F right tibia, 32 mm patella, 12 mm MC poly Implant Name Type Inv. Item Serial No. Manufacturer Lot No. LRB No. Used Action  CEMENT BONE R 1X40 - [id number] Cement CEMENT BONE R 1X40  [employer](ORTH,TRAU,BIO,SG) [id number] Right 2 Implanted  STEM POLY PAT PLY 65M KNEE - [id number] Knees STEM POLY PAT PLY 65M KNEE  [employer](ORTH,TRAU,BIO,SG) [id number] Right 1 Implanted  TIBIA STEM 5 DEG SZ F R KNEE - [id number] Knees TIBIA STEM 5 DEG SZ F R KNEE  [employer]MER [employer]ON(ORTH,TRAU,BIO,SG) [id number] Right 1 Implanted  COMP FEM KNEE NRW SZ9 RT - [id number] Joint COMP FEM KNEE NRW SZ9 RT  [employer](ORTH,TRAU,BIO,SG) [id number] Right 1 Implanted  INSERT TIB ARTISURF [id number] - [id number] Insert INSERT TIB ARTISURF [id number]  Z[employer]MER [employer]ON(ORTH,TRAU,BIO,SG) [id number] Right 1 Implanted      PREOPERATIVE INDICATIONS:  Brandi Spence is a 76 y.o. year old female with end stage bone on bone degenerative arthritis of the knee who failed conservative treatment, including injections, antiinflammatories, activity modification, and assistive devices, and had significant impairment of their activities of daily living, and elected for Total Knee Arthroplasty.   The risks, benefits, and alternatives were discussed at length including but not limited to the risks of infection, bleeding, nerve injury, stiffness, blood clots, the need for revision surgery, cardiopulmonary complications, among others, and they were willing to  proceed.   ESTIMATED BLOOD LOSS: 25cc  OPERATIVE DESCRIPTION:   Once adequate anesthesia was induced, preoperative antibiotics, 2 gm of ancef,1 gm of Tranexamic Acid, and 8 mg of Decadron administered, the patient was positioned supine with a right thigh tourniquet placed.  The right lower extremity was prepped and draped in sterile fashion.  A time-  out was performed identifying the patient, planned procedure, and the appropriate extremity.     The leg was  exsanguinated, tourniquet elevated to 250 mmHg.  A midline incision was  made followed by median parapatellar arthrotomy. Anterior horn of the medial meniscus was released and resected. A medial release was performed, the infrapatellar fat pad was resected with care taken to protect the patellar tendon. The suprapatellar fat was removed to exposed the distal anterior femur. The anterior horn of the lateral meniscus and ACL were released.    Following initial  exposure, attention was first to the femur.  The femoral  canal was opened with a drill, canal was suctioned to try to prevent fat emboli.  An  intramedullary rod was passed set at 5 degrees valgus, 10 mm. The distal femur was resected.  Following this resection, the tibia was  subluxated anteriorly.  Using the extramedullary guide, 10 mm of bone was resected off   the proximal medial tibia.  We confirmed the gap would be  stable medially and laterally with a size 15m spacer block as well as confirmed that the tibial cut was perpendicular in the coronal plane, checking with an alignment rod.    Once this was done, the posterior femoral referencing femoral sizer was placed under to the posterior condyles with 3  degrees of external rotational which was parallel to the transepicondylar axis and perpendicular to [employer]. The femur was sized to be a size 9 in the anterior-  posterior dimension. The  anterior, posterior, and  chamfer cuts were made without difficulty nor   notching making  certain that I was along the anterior cortex to help  with flexion gap stability. Next a laminar spreader was placed with the knee in flexion and the medial lateral menisci were resected.  5 cc of the Exparel mixture was injected in the medial side of the back of the knee and 3 cc in the lateral side.  1/2 inch curved osteotome was used to resect posterior osteophyte that was then removed with a pituitary rongeur.       At this point, the tibia was sized to be a size F.  The size F tray was  then pinned in position. Trial reduction was now carried with a 9 femur, F tibia, a 10 mm MC insert.  In extension the knee was relatively tight laterally.  A 15 blade was used to partially release the IT band laterally at the level of the joint line.  We then inserted a 12 mm insert.  We had full extension and was stable to varus valgus stress in extension.  The knee was slightly tight in flexion and the PCL was partially released.   Attention was next directed to the patella.  Precut  measurement was noted to be 24 mm.  I resected down to 14 mm and used a  53m patellar button to restore patellar height as well as cover the cut surface.     The patella lug holes were drilled and a 32 mm patella poly trial was placed.    The knee was brought to full extension with good flexion stability with the patella tracking through the trochlea without application of pressure.     Next the femoral component was again assessed and determined to be seated and appropriately lateralized.  The femoral lug holes were drilled.  The femoral component was then removed. Tibial component was again assessed and felt to be seated and appropriately rotated with the medial third of the tubercle. The tibia was then drilled, and keel punched.     Final components were  opened and regular cement was mixed.      Final implants were then  cemented onto cleaned and dried cut surfaces of bone with the knee brought to extension with a 12 mm MC  poly.  The knee was irrigated with sterile Betadine diluted in saline as well as pulse lavage normal saline.  The synovial lining was  then injected a dilute Exparel.      Once the cement had fully cured, excess cement was removed throughout the knee.  I confirmed that I was satisfied with the range of motion and stability, and the final 12 mm MC poly insert was chosen.  It was placed into the knee.         The tourniquet had been let down.  No significant hemostasis was required.  The medial parapatellar arthrotomy was then reapproximated using #1 Vicryl and #1 Stratafix sutures with the knee  in flexion.  The remaining wound was closed with 0 stratafix, 2-0 Vicryl, and running 3-0 Monocryl. The knee was cleaned, dried, dressed sterilely using Dermabond and   Aquacel dressing.  The patient was then brought to recovery room in stable condition, tolerating the procedure  well. There were no  complications.   Post op recs: WB: WBAT Abx: ancef x23 hours post op Imaging: PACU xrays DVT prophylaxis: Eliquis 2.5 mg twice daily postop day 1-2.  Eliquis 5 mg starting postop day 3 Follow up: 2 weeks after surgery for a wound check with Dr. Zachery Dakins at [facility].  Address: [address], [place], [place] [address]  Office Phone: [phone]  Charlies Constable, MD Orthopaedic Surgery

## 2021-09-12 NOTE — Evaluation (Signed)
Physical Therapy Evaluation Patient Details Name: Brandi Spence MRN: [id number] DOB: [birth year] Today's Date: 09/12/2021  History of Present Illness  Pt is a 76yo female presenting s/p R-TKa on 09/12/21 PMH: PONV, HLD, HTN, hypothyroidism, peripheral neuropathy, R-CTR, R tarsal tunnel release, Spinal cord stimulator L 2012.   Clinical Impression  Brandi Spence is a 76 y.o. female POD 0 s/p R-TKA. Patient reports independence with mobility at baseline. Patient is now limited by functional impairments (see PT problem list below) and requires min assist for bed mobility and min guard for transfers. Patient was able to ambulate 15 feet with RW and min guard level of assist. Patient instructed in exercise to facilitate ROM and circulation to manage edema. Provided incentive spirometer and with Vcs pt able to achieve 1268m. Patient will benefit from continued skilled PT interventions to address impairments and progress towards PLOF. Acute PT will follow to progress mobility and stair training in preparation for safe discharge home.       Recommendations for follow up therapy are one component of a multi-disciplinary discharge planning process, led by the attending physician.  Recommendations may be updated based on patient status, additional functional criteria and insurance authorization.  Follow Up Recommendations Follow physician's recommendations for discharge plan and follow up therapies      Assistance Recommended at Discharge Set up Supervision/Assistance  Patient can return home with the following  A little help with walking and/or transfers;A little help with bathing/dressing/bathroom;Assistance with cooking/housework;Assist for transportation;Help with stairs or ramp for entrance    Equipment Recommendations Rolling walker (2 wheels)  Recommendations for Other Services       Functional Status Assessment Patient has had a recent decline in their functional status and demonstrates  the ability to make significant improvements in function in a reasonable and predictable amount of time.     Precautions / Restrictions Precautions Precautions: Fall Restrictions Weight Bearing Restrictions: No Other Position/Activity Restrictions: WBAT      Mobility  Bed Mobility Overal bed mobility: Needs Assistance Bed Mobility: Supine to Sit     Supine to sit: Min assist     General bed mobility comments: Min assist to bring RLE off bed, otherwise min guard    Transfers Overall transfer level: Needs assistance Equipment used: Rolling walker (2 wheels) Transfers: Sit to/from Stand Sit to Stand: Min guard, From elevated surface           General transfer comment: Pt min guard from elevated surface, no physical assist required. Upon standing, pt required encouragement to place R foot on floor, educated pt on WBAT status and safety of using RLE, pt gingerly able to place foot on floor and weightbear.    Ambulation/Gait Ambulation/Gait assistance: Min guard, +2 safety/equipment Gait Distance (Feet): 15 Feet Assistive device: Rolling walker (2 wheels) Gait Pattern/deviations: Step-to pattern, Decreased step length - right, Decreased stance time - right, Decreased weight shift to right Gait velocity: decreased     General Gait Details: Pt ambulated with RW and min guard, no physical assist required or overt LOB noted, +2 for recliner follow for safety.  Stairs            Wheelchair Mobility    Modified Rankin (Stroke Patients Only)       Balance Overall balance assessment: Needs assistance Sitting-balance support: Feet supported, No upper extremity supported Sitting balance-Leahy Scale: Fair     Standing balance support: During functional activity, Reliant on assistive device for balance, Bilateral upper extremity supported Standing balance-Leahy  Scale: Poor Standing balance comment: Pt with weight shifted to L, bearing heavy weight on BUE.                              Pertinent Vitals/Pain Pain Assessment Pain Assessment: 0-10 Pain Score: 6  Pain Location: R knee Pain Descriptors / Indicators: Operative site guarding Pain Intervention(s): Limited activity within patient's tolerance, Monitored during session, Repositioned, Ice applied    Home Living Family/patient expects to be discharged to:: Private residence Living Arrangements: Alone Available Help at Discharge: Available 24 hours/day;Family;Friend(s) Type of Home: House Home Access: Stairs to enter Entrance Stairs-Rails: Psychiatric nurse of Steps: 4 Alternate Level Stairs-Number of Steps: 13 Home Layout: Two level;Able to live on main level with bedroom/bathroom (Pt has made up sofa downstairs and has access to a toilet but not a shower, plans to sponge bathe.) Home Equipment: Rollator (4 wheels);Grab bars - tub/shower;Shower seat Additional Comments: Rollator originally belonged to pt's mother.    Prior Function Prior Level of Function : Independent/Modified Independent             Mobility Comments: ind ADLs Comments: ind     Hand Dominance        Extremity/Trunk Assessment   Upper Extremity Assessment Upper Extremity Assessment: Overall WFL for tasks assessed    Lower Extremity Assessment Lower Extremity Assessment: RLE deficits/detail;LLE deficits/detail RLE Deficits / Details: MMT ank DF/PF 4/5, pt unable to perform SLR >1" off bed surface without use of hands RLE Sensation: history of peripheral neuropathy LLE Deficits / Details: MMT ank DF/PF 4/5 LLE Sensation: history of peripheral neuropathy    Cervical / Trunk Assessment Cervical / Trunk Assessment: Normal  Communication   Communication: No difficulties  Cognition Arousal/Alertness: Awake/alert Behavior During Therapy: WFL for tasks assessed/performed Overall Cognitive Status: Within Functional Limits for tasks assessed                                           General Comments      Exercises Total Joint Exercises Ankle Circles/Pumps: AROM, Both, 5 reps, 10 reps   Assessment/Plan    PT Assessment Patient needs continued PT services  PT Problem List Decreased strength;Decreased range of motion;Decreased activity tolerance;Decreased balance;Decreased mobility;Decreased coordination;Pain       PT Treatment Interventions DME instruction;Gait training;Stair training;Functional mobility training;Therapeutic activities;Therapeutic exercise;Balance training;Neuromuscular re-education;Patient/family education    PT Goals (Current goals can be found in the Care Plan section)  Acute Rehab PT Goals Patient Stated Goal: Walk without pain PT Goal Formulation: With patient Time For Goal Achievement: 09/19/21 Potential to Achieve Goals: Good    Frequency 7X/week     Co-evaluation               AM-PAC PT "6 Clicks" Mobility  Outcome Measure Help needed turning from your back to your side while in a flat bed without using bedrails?: None Help needed moving from lying on your back to sitting on the side of a flat bed without using bedrails?: A Little Help needed moving to and from a bed to a chair (including a wheelchair)?: A Little Help needed standing up from a chair using your arms (e.g., wheelchair or bedside chair)?: A Little Help needed to walk in hospital room?: A Little Help needed climbing 3-5 steps with a railing? : A Little 6 Click  Score: 19    End of Session Equipment Utilized During Treatment: Gait belt Activity Tolerance: Patient tolerated treatment well;No increased pain Patient left: in chair;with call bell/phone within reach;with chair alarm set;with SCD's reapplied Nurse Communication: Mobility status PT Visit Diagnosis: Pain;Difficulty in walking, not elsewhere classified (R26.2) Pain - Right/Left: Right Pain - part of body: Knee    Time: 1650-1709 PT Time Calculation (min) (ACUTE ONLY): 19  min   Charges:   PT Evaluation $PT Eval Low Complexity: Cameron, PT, DPT [place] [facility] Office: [phone] Pager: [phone]  Coolidge Breeze 09/12/2021, 6:19 PM

## 2021-09-12 NOTE — Interval H&P Note (Signed)
The patient has been re-examined, and the chart reviewed, and there have been no interval changes to the documented history and physical.    Plan for Right TKA for right knee OA  The operative side was examined and the patient was confirmed to have sensation to DPN, SPN, TN intact, Motor EHL, ext, flex 5/5, and DP 2+, PT 2+, No significant edema.   The risks, benefits, and alternatives have been discussed at length with patient, and the patient is willing to proceed.  Right knee marked. Consent has been signed.

## 2021-09-13 ENCOUNTER — Encounter (HOSPITAL_COMMUNITY): Payer: Self-pay | Admitting: Orthopedic Surgery

## 2021-09-13 DIAGNOSIS — Z79890 Hormone replacement therapy (postmenopausal): Secondary | ICD-10-CM | POA: Diagnosis not present

## 2021-09-13 DIAGNOSIS — M1711 Unilateral primary osteoarthritis, right knee: Secondary | ICD-10-CM | POA: Diagnosis not present

## 2021-09-13 DIAGNOSIS — I10 Essential (primary) hypertension: Secondary | ICD-10-CM | POA: Diagnosis not present

## 2021-09-13 DIAGNOSIS — Z79899 Other long term (current) drug therapy: Secondary | ICD-10-CM | POA: Diagnosis not present

## 2021-09-13 DIAGNOSIS — E039 Hypothyroidism, unspecified: Secondary | ICD-10-CM | POA: Diagnosis not present

## 2021-09-13 DIAGNOSIS — Z7901 Long term (current) use of anticoagulants: Secondary | ICD-10-CM | POA: Diagnosis not present

## 2021-09-13 LAB — BASIC METABOLIC PANEL
Anion gap: 6 (ref 5–15)
BUN: 16 mg/dL (ref 8–23)
CO2: 31 mmol/L (ref 22–32)
Calcium: 8.4 mg/dL — ABNORMAL LOW (ref 8.9–10.3)
Chloride: 103 mmol/L (ref 98–111)
Creatinine, Ser: 0.83 mg/dL (ref 0.44–1.00)
GFR, Estimated: >60 mL/min (ref >60)
Glucose, Bld: 100 mg/dL — ABNORMAL HIGH (ref 70–99)
Potassium: 4.6 mmol/L (ref 3.5–5.1)
Sodium: 140 mmol/L (ref 135–145)

## 2021-09-13 LAB — CBC
HCT: 34.6 % — ABNORMAL LOW (ref 36.0–46.0)
Hemoglobin: 11.3 g/dL — ABNORMAL LOW (ref 12.0–15.0)
MCH: 30.9 pg (ref 26.0–34.0)
MCHC: 32.7 g/dL (ref 30.0–36.0)
MCV: 94.5 fL (ref 80.0–100.0)
Platelets: 171 10*3/uL (ref 150–400)
RBC: 3.66 MIL/uL — ABNORMAL LOW (ref 3.87–5.11)
RDW: 12.9 % (ref 11.5–15.5)
WBC: 5.6 10*3/uL (ref 4.0–10.5)
nRBC: 0 % (ref 0.0–0.2)

## 2021-09-13 MED ORDER — ACETAMINOPHEN 500 MG PO TABS: 1000.0000 mg | ORAL_TABLET | Freq: Three times a day (TID) | ORAL | 0 refills | Status: AC | PRN

## 2021-09-13 MED ORDER — ONDANSETRON HCL 4 MG PO TABS: 4.0000 mg | ORAL_TABLET | Freq: Three times a day (TID) | ORAL | 0 refills | Status: AC | PRN

## 2021-09-13 MED ORDER — OXYCODONE HCL 5 MG PO TABS: 5.0000 mg | ORAL_TABLET | ORAL | 0 refills | Status: AC | PRN

## 2021-09-13 MED ORDER — OXYCODONE HCL 5 MG PO TABS: 5.0000 mg | ORAL_TABLET | ORAL | 0 refills | Status: DC | PRN

## 2021-09-13 MED ORDER — METHOCARBAMOL 500 MG PO TABS: 500.0000 mg | ORAL_TABLET | Freq: Three times a day (TID) | ORAL | 0 refills | Status: AC | PRN

## 2021-09-13 MED ORDER — ELIQUIS 5 MG PO TABS: ORAL_TABLET | ORAL | 1 refills | Status: DC

## 2021-09-13 NOTE — Progress Notes (Signed)
Orthopedic Tech Progress Note Patient Details:  Brandi Spence 1945/11/04 [id number]  Ortho Devices Type of Ortho Device: Bone foam zero knee Ortho Device/Splint Location: RLE Ortho Device/Splint Interventions: Ordered, Application   Post Interventions Patient Tolerated: Fair Instructions Provided: Care of device  [name] 09/13/2021, 9:34 AM

## 2021-09-13 NOTE — Progress Notes (Signed)
Physical Therapy Treatment Patient Details Name: Brandi Spence MRN: [id number] DOB: Nov 29, 1945 Today's Date: 09/13/2021   History of Present Illness Pt is a 76yo female presenting s/p R-TKa on 09/12/21 PMH: PONV, HLD, HTN, hypothyroidism, peripheral neuropathy, R-CTR, R tarsal tunnel release, Spinal cord stimulator L 2012.    PT Comments    Pt received in bed, agreed to out of bed. Pt premedicated prior to session. 5/10 Right knee pain with wt bearing. Pt with increased tolerance for transfers and gait training this session. Able to negotiate 10 steps with B hand rails and Supervision/Min Guard. Pt feels comfortable returning home this pm with assistance from friends and family. Continue PT this afternoon prior to d/c.    Recommendations for follow up therapy are one component of a multi-disciplinary discharge planning process, led by the attending physician.  Recommendations may be updated based on patient status, additional functional criteria and insurance authorization.  Follow Up Recommendations  Follow physician's recommendations for discharge plan and follow up therapies     Assistance Recommended at Discharge Set up Supervision/Assistance  Patient can return home with the following A little help with walking and/or transfers;A little help with bathing/dressing/bathroom;Assistance with cooking/housework;Assist for transportation;Help with stairs or ramp for entrance   Equipment Recommendations  Rolling walker (2 wheels)    Recommendations for Other Services       Precautions / Restrictions Precautions Precautions: Fall Restrictions Weight Bearing Restrictions: Yes RLE Weight Bearing: Weight bearing as tolerated     Mobility  Bed Mobility Overal bed mobility: Needs Assistance Bed Mobility: Supine to Sit     Supine to sit: Supervision     General bed mobility comments: Pt able to maneuver R LE independently    Transfers Overall transfer level: Needs  assistance Equipment used: Rolling walker (2 wheels) Transfers: Sit to/from Stand Sit to Stand: Min guard, From elevated surface           General transfer comment: MinA to raise from low toilet seat    Ambulation/Gait Ambulation/Gait assistance: Min guard Gait Distance (Feet): 80 Feet Assistive device: Rolling walker (2 wheels) Gait Pattern/deviations: Step-to pattern, Decreased weight shift to right Gait velocity: decreased     General Gait Details: Good demonstration of safety awareness, did not need to follow with recliner   Stairs Stairs: Yes Stairs assistance: Supervision Stair Management: Two rails, Forwards, Step to pattern Number of Stairs: 10 General stair comments: Pt demonstrated good sequencing while negotiating stairs. She plans to stay on first floor until comfortable negotiating full flight   Wheelchair Mobility    Modified Rankin (Stroke Patients Only)       Balance Overall balance assessment: Needs assistance Sitting-balance support: Feet supported, No upper extremity supported Sitting balance-Leahy Scale: Good     Standing balance support: During functional activity, Reliant on assistive device for balance, Bilateral upper extremity supported Standing balance-Leahy Scale: Fair Standing balance comment: Increased safety awareness and balance this session                            Cognition Arousal/Alertness: Awake/alert Behavior During Therapy: WFL for tasks assessed/performed Overall Cognitive Status: Within Functional Limits for tasks assessed                                          Exercises Total Joint Exercises Ankle Circles/Pumps: AROM, Both, 10  reps Quad Sets: AROM, Right, 5 reps Heel Slides: AROM, Right, Seated, Other (comment) (on towel) Long Arc Quad: AROM, Right, 10 reps, Seated Knee Flexion: AROM, Right, Seated Goniometric ROM: 0-100    General Comments        Pertinent Vitals/Pain Pain  Assessment Pain Assessment: 0-10 Pain Score: 5  Pain Location: R knee Pain Descriptors / Indicators: Operative site guarding Pain Intervention(s): Monitored during session, Premedicated before session, Ice applied    Home Living                          Prior Function            PT Goals (current goals can now be found in the care plan section) Acute Rehab PT Goals Patient Stated Goal: Walk without pain Progress towards PT goals: Progressing toward goals    Frequency    7X/week      PT Plan Current plan remains appropriate    Co-evaluation              AM-PAC PT "6 Clicks" Mobility   Outcome Measure  Help needed turning from your back to your side while in a flat bed without using bedrails?: None Help needed moving from lying on your back to sitting on the side of a flat bed without using bedrails?: A Little Help needed moving to and from a bed to a chair (including a wheelchair)?: A Little Help needed standing up from a chair using your arms (e.g., wheelchair or bedside chair)?: A Little Help needed to walk in hospital room?: A Little Help needed climbing 3-5 steps with a railing? : A Little 6 Click Score: 19    End of Session Equipment Utilized During Treatment: Gait belt Activity Tolerance: Patient tolerated treatment well;No increased pain Patient left: in chair;with call bell/phone within reach;with chair alarm set;with SCD's reapplied Nurse Communication: Mobility status PT Visit Diagnosis: Pain;Difficulty in walking, not elsewhere classified (R26.2) Pain - Right/Left: Right Pain - part of body: Knee     Time: 0160-1093 PT Time Calculation (min) (ACUTE ONLY): 40 min  Charges:  $Gait Training: 8-22 mins $Therapeutic Exercise: 8-22 mins $Therapeutic Activity: 8-22 mins                    Mikel Cella, PTA   Josie Dixon 09/13/2021, 3:09 PM

## 2021-09-13 NOTE — Progress Notes (Signed)
Physical Therapy Treatment Patient Details Name: Brandi Spence MRN: [id number] DOB: [birth year] Today's Date: 09/13/2021   History of Present Illness Pt is a 76yo female presenting s/p R-TKa on 09/12/21 PMH: PONV, HLD, HTN, hypothyroidism, peripheral neuropathy, R-CTR, R tarsal tunnel release, Spinal cord stimulator L 2012.    PT Comments    Pt seen for final session this pm with focus on transfers and increasing gait distance to 271f with RW, Supervision, and step through pattern. Pt educated on proper technique for car transfer. All questions/concerns addressed. Pt is cleared for d/c home.   Recommendations for follow up therapy are one component of a multi-disciplinary discharge planning process, led by the attending physician.  Recommendations may be updated based on patient status, additional functional criteria and insurance authorization.  Follow Up Recommendations  Follow physician's recommendations for discharge plan and follow up therapies     Assistance Recommended at Discharge Set up Supervision/Assistance  Patient can return home with the following A little help with walking and/or transfers;A little help with bathing/dressing/bathroom;Assistance with cooking/housework;Assist for transportation;Help with stairs or ramp for entrance   Equipment Recommendations  Rolling walker (2 wheels)    Recommendations for Other Services       Precautions / Restrictions Precautions Precautions: Fall Restrictions Weight Bearing Restrictions: Yes RLE Weight Bearing: Weight bearing as tolerated     Mobility  Bed Mobility Overal bed mobility: Needs Assistance Bed Mobility: Supine to Sit     Supine to sit: Supervision     General bed mobility comments: Pt able to maneuver R LE independently    Transfers Overall transfer level: Needs assistance Equipment used: Rolling walker (2 wheels) Transfers: Sit to/from Stand Sit to Stand: Supervision           General  transfer comment: MinA to raise from low toilet seat    Ambulation/Gait Ambulation/Gait assistance: Supervision Gait Distance (Feet): 175 Feet Assistive device: Rolling walker (2 wheels) Gait Pattern/deviations: Step-through pattern Gait velocity: decreased     General Gait Details: Good demonstration of safety awareness, did not need to follow with recliner   Stairs Stairs: Yes Stairs assistance: Supervision Stair Management: Two rails, Forwards, Step to pattern Number of Stairs: 10 General stair comments: Pt demonstrated good sequencing while negotiating stairs. She plans to stay on first floor until comfortable negotiating full flight   Wheelchair Mobility    Modified Rankin (Stroke Patients Only)       Balance Overall balance assessment: Needs assistance Sitting-balance support: Feet supported, No upper extremity supported Sitting balance-Leahy Scale: Good     Standing balance support: During functional activity, Reliant on assistive device for balance, Bilateral upper extremity supported Standing balance-Leahy Scale: Fair Standing balance comment: Increased safety awareness and balance this session                            Cognition Arousal/Alertness: Awake/alert Behavior During Therapy: WFL for tasks assessed/performed Overall Cognitive Status: Within Functional Limits for tasks assessed                                          Exercises Total Joint Exercises Ankle Circles/Pumps: AROM, Both, 10 reps Quad Sets: AROM, Right, 5 reps Heel Slides: AROM, Right, Seated, Other (comment) (on towel) Long Arc Quad: AROM, Right, 10 reps, Seated Knee Flexion: AROM, Right, Seated Goniometric ROM: 0-100  General Comments General comments (skin integrity, edema, etc.): Pt educated on proper car transfer technique with good understanding      Pertinent Vitals/Pain Pain Assessment Pain Assessment: 0-10 Pain Score: 5  Pain Location: R  knee Pain Descriptors / Indicators: Operative site guarding Pain Intervention(s): Monitored during session    Home Living                          Prior Function            PT Goals (current goals can now be found in the care plan section) Acute Rehab PT Goals Patient Stated Goal: Walk without pain Progress towards PT goals: Progressing toward goals    Frequency    7X/week      PT Plan Current plan remains appropriate    Co-evaluation              AM-PAC PT "6 Clicks" Mobility   Outcome Measure  Help needed turning from your back to your side while in a flat bed without using bedrails?: None Help needed moving from lying on your back to sitting on the side of a flat bed without using bedrails?: A Little Help needed moving to and from a bed to a chair (including a wheelchair)?: A Little Help needed standing up from a chair using your arms (e.g., wheelchair or bedside chair)?: A Little Help needed to walk in hospital room?: A Little Help needed climbing 3-5 steps with a railing? : A Little 6 Click Score: 19    End of Session Equipment Utilized During Treatment: Gait belt Activity Tolerance: Patient tolerated treatment well;No increased pain Patient left: in chair;with call bell/phone within reach;with chair alarm set;with SCD's reapplied Nurse Communication: Mobility status PT Visit Diagnosis: Pain;Difficulty in walking, not elsewhere classified (R26.2) Pain - Right/Left: Right Pain - part of body: Knee     Time: 8280-0349 PT Time Calculation (min) (ACUTE ONLY): 14 min  Charges:  $Gait Training: 8-22 mins $Therapeutic Exercise: 8-22 mins $Therapeutic Activity: 8-22 mins                    Mikel Cella, PTA    Josie Dixon 09/13/2021, 4:47 PM

## 2021-09-13 NOTE — TOC Transition Note (Signed)
Transition of Care [facility]) - CM/SW Discharge Note   Patient Details  Name: Brandi Spence MRN: [id number] Date of Birth: Jan 23, 1946  [facility] CM/SW Contact:  Ross Ludwig, LCSW Phone Number: 09/13/2021, 12:28 PM   Clinical Narrative:     CSW was informed that patient will need a rolling walker.  CSW contacted Danielle at [facility] and she will deliver the walker to patient's room before she leaves.  CSW was also informed that patient's home health was prearranged with [facility].  CSW signing off, please reconsult if other social work needs arise.   Final next level of care: [place] Barriers to Discharge: Barriers Resolved   Patient Goals and CMS Choice Patient states their goals for this hospitalization and ongoing recovery are:: To return back home with home health. CMS Medicare.gov Compare Post Acute Care list provided to:: Patient Choice offered to / list presented to : Patient  Discharge Placement                       Discharge Plan and Services                DME Arranged: Walker rolling DME Agency: [employer] Date DME Agency Contacted: 09/13/21 Time DME Agency Contacted: 1228 Representative spoke with at DME Agency: [place]: PT [place]: [place]        Social Determinants of Health ([place]) Interventions     Readmission Risk Interventions     No data to display

## 2021-09-13 NOTE — Progress Notes (Signed)
     Subjective:  Patient reports pain as well controlled this morning.  Slept well overnight.  Worked well with physical therapy on postop day 0 ambulating 15 feet.  Plan for additional therapy this morning and then discharge home later.  Denies distal numbness and tingling.  Objective:   VITALS:   Vitals:   09/12/21 1700 09/12/21 1742 09/12/21 2147 09/13/21 0217  BP:  (!) 120/54 (!) 109/56 108/61  Pulse:  (!) 48 (!) 49 (!) 56  Resp:  $Remo'15 17 17  'eFhTh$ Temp:  (!) 97.4 F (36.3 C) 97.7 F (36.5 C) (!) 97.5 F (36.4 C)  TempSrc:  Oral Oral Oral  SpO2:  97% 97% 100%  Weight: 88.5 kg 88.5 kg    Height: $Remove'5\' 6"'kTzswxO$  (1.676 m) $RemoveB'5\' 6"'wRwvsJxJ$  (1.676 m)      Sensation intact distally Intact pulses distally Dorsiflexion/Plantar flexion intact Incision: dressing C/D/I Compartment soft   Lab Results  Component Value Date   WBC 5.6 09/13/2021   HGB 11.3 (L) 09/13/2021   HCT 34.6 (L) 09/13/2021   MCV 94.5 09/13/2021   PLT 171 09/13/2021   BMET    Component Value Date/Time   NA 140 09/13/2021 0251   NA 142 02/02/2021 1639   K 4.6 09/13/2021 0251   CL 103 09/13/2021 0251   CO2 31 09/13/2021 0251   GLUCOSE 100 (H) 09/13/2021 0251   BUN 16 09/13/2021 0251   BUN 15 02/02/2021 1639   CREATININE 0.83 09/13/2021 0251   CALCIUM 8.4 (L) 09/13/2021 0251   EGFR 78 02/02/2021 1639   GFRNONAA >60 09/13/2021 0251      Xray: X-rays right knee demonstrate on arthroplasty components good position no adverse features.  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  Eliquis 5 mg starting postop day 3 Follow up: 2 weeks after surgery for a wound check with Dr. Zachery Dakins at [facility]  Address: [address] [place], [place], [place] [address]  Office  Phone: [phone]    Willaim Sheng 09/13/2021, 6:24 AM   Charlies Constable, MD  Contact information:   [phone] 7am-5pm epic message Dr. Zachery Dakins, or call office for patient follow up: [phone] After hours and holidays please check [email or web address] for group call information for [facility]

## 2021-09-13 NOTE — Progress Notes (Signed)
Discharge instructions and medications reviewed with patient. All questions and concerns addressed. "where will my medications be sent". All belongings given to patient. IV removed per protocol, tolerated well, intact. Currently stable with no s/sx of pain/discomfort.

## 2021-09-14 DIAGNOSIS — G629 Polyneuropathy, unspecified: Secondary | ICD-10-CM | POA: Diagnosis not present

## 2021-09-14 DIAGNOSIS — Z7901 Long term (current) use of anticoagulants: Secondary | ICD-10-CM | POA: Diagnosis not present

## 2021-09-14 DIAGNOSIS — Z96651 Presence of right artificial knee joint: Secondary | ICD-10-CM | POA: Diagnosis not present

## 2021-09-14 DIAGNOSIS — Z9682 Presence of neurostimulator: Secondary | ICD-10-CM | POA: Diagnosis not present

## 2021-09-14 DIAGNOSIS — Z471 Aftercare following joint replacement surgery: Secondary | ICD-10-CM | POA: Diagnosis not present

## 2021-09-14 DIAGNOSIS — I4891 Unspecified atrial fibrillation: Secondary | ICD-10-CM | POA: Diagnosis not present

## 2021-09-14 DIAGNOSIS — E785 Hyperlipidemia, unspecified: Secondary | ICD-10-CM | POA: Diagnosis not present

## 2021-09-14 DIAGNOSIS — Z87891 Personal history of nicotine dependence: Secondary | ICD-10-CM | POA: Diagnosis not present

## 2021-09-14 DIAGNOSIS — Z85828 Personal history of other malignant neoplasm of skin: Secondary | ICD-10-CM | POA: Diagnosis not present

## 2021-09-14 DIAGNOSIS — Z90710 Acquired absence of both cervix and uterus: Secondary | ICD-10-CM | POA: Diagnosis not present

## 2021-09-14 DIAGNOSIS — Z9049 Acquired absence of other specified parts of digestive tract: Secondary | ICD-10-CM | POA: Diagnosis not present

## 2021-09-14 DIAGNOSIS — R7303 Prediabetes: Secondary | ICD-10-CM | POA: Diagnosis not present

## 2021-09-14 DIAGNOSIS — I10 Essential (primary) hypertension: Secondary | ICD-10-CM | POA: Diagnosis not present

## 2021-09-14 DIAGNOSIS — E039 Hypothyroidism, unspecified: Secondary | ICD-10-CM | POA: Diagnosis not present

## 2021-09-14 NOTE — Discharge Summary (Signed)
Physician Discharge Summary  Patient ID: Brandi Spence MRN: [id number] DOB/AGE: 1945/11/25 76 y.o.  Admit date: 09/12/2021 Discharge date:  09/13/2021  Admission Diagnoses:  Localized osteoarthritis of right knee  Discharge Diagnoses:  Principal Problem:   Localized osteoarthritis of right knee   Past Medical History:  Diagnosis Date   Arthritis    Dysrhythmia    A.fib   Family history of adverse reaction to anesthesia    Mother and sister have PONV   History of kidney stones    Hyperlipidemia    Hypertension    Hypothyroidism    Neuropathy    feet   Pre-diabetes    Primary localized osteoarthritis of right knee 01/05/2021    Surgeries: Procedure(s): TOTAL KNEE ARTHROPLASTY on 09/12/2021   Consultants (if any):   Discharged Condition: Improved  Hospital Course: Brandi Spence is an 76 y.o. female who was admitted 09/12/2021 with a diagnosis of Localized osteoarthritis of right knee and went to the operating room on 09/12/2021 and underwent the above named procedures.    She was given perioperative antibiotics:  Anti-infectives (From admission, onward)    Start     Dose/Rate Route Frequency Ordered Stop   09/12/21 1645  ceFAZolin (ANCEF) IVPB 2g/100 mL premix        2 g 200 mL/hr over 30 Minutes Intravenous Every 6 hours 09/12/21 1547 09/12/21 2304   09/12/21 0815  ceFAZolin (ANCEF) IVPB 2g/100 mL premix        2 g 200 mL/hr over 30 Minutes Intravenous On call to O.R. 09/12/21 0802 09/12/21 1030     .  She was given sequential compression devices, early ambulation, and eliquis for DVT prophylaxis.  She benefited maximally from the hospital stay and there were no complications.    Recent vital signs:  Vitals:   09/13/21 0910 09/13/21 1343  BP: (!) 91/53 (!) 97/59  Pulse: (!) 51 (!) 56  Resp: 17 17  Temp: 97.8 F (36.6 C) 97.6 F (36.4 C)  SpO2: 96% 95%    Recent laboratory studies:  Lab Results  Component Value Date   HGB 11.3 (L) 09/13/2021    HGB 14.1 08/30/2021   HGB 14.7 03/28/2021   Lab Results  Component Value Date   WBC 5.6 09/13/2021   PLT 171 09/13/2021   Lab Results  Component Value Date   INR 1.2 05/12/2021   Lab Results  Component Value Date   NA 140 09/13/2021   K 4.6 09/13/2021   CL 103 09/13/2021   CO2 31 09/13/2021   BUN 16 09/13/2021   CREATININE 0.83 09/13/2021   GLUCOSE 100 (H) 09/13/2021    Discharge Medications:   Allergies as of 09/13/2021       Reactions   Prednisone Other (See Comments)   Could not sleep and agitated        Medication List     STOP taking these medications    HYDROcodone-acetaminophen 5-325 MG tablet Commonly known as: NORCO/VICODIN       TAKE these medications    acetaminophen 500 MG tablet Commonly known as: TYLENOL Take 2 tablets (1,000 mg total) by mouth every 8 (eight) hours as needed.   cetirizine 10 MG tablet Commonly known as: ZYRTEC Take 10 mg by mouth at bedtime.   diltiazem 30 MG tablet Commonly known as: CARDIZEM TAKE 1 TABLET EVERY 4 HOURS AS NEEDED FOR HEART RATE >100   docusate sodium 100 MG capsule Commonly known as: COLACE Take 100 mg by mouth  at bedtime.   dofetilide 250 MCG capsule Commonly known as: TIKOSYN Take 1 capsule (250 mcg total) by mouth 2 (two) times daily.   Eliquis 5 MG Tabs tablet Generic drug: apixaban Take 2.'5mg'$  twice daily on 7/11 and 7/12. Resume '5mg'$  twice daily on 7/13. What changed:  how much to take how to take this when to take this additional instructions   FIBER PO Take 1 tablet by mouth at bedtime.   furosemide 20 MG tablet Commonly known as: LASIX TAKE 1 TABLET (20 MG TOTAL) BY MOUTH DAILY AS NEEDED FOR EDEMA.   levothyroxine 75 MCG tablet Commonly known as: SYNTHROID Take 75 mcg by mouth daily before breakfast.   MELATONIN PO Take 1 tablet by mouth at bedtime.   methocarbamol 500 MG tablet Commonly known as: ROBAXIN Take 1 tablet (500 mg total) by mouth every 8 (eight) hours as  needed for up to 10 days for muscle spasms.   metoprolol succinate 25 MG 24 hr tablet Commonly known as: TOPROL-XL Take 12.5 mg by mouth daily.   ondansetron 4 MG tablet Commonly known as: Zofran Take 1 tablet (4 mg total) by mouth every 8 (eight) hours as needed for up to 14 days for nausea or vomiting.   OVER THE COUNTER MEDICATION Take 2 tablets by mouth daily. BondiBoost Hair Growth Supplement   oxyCODONE 5 MG immediate release tablet Commonly known as: Roxicodone Take 1 tablet (5 mg total) by mouth every 4 (four) hours as needed for up to 7 days for severe pain or moderate pain.   potassium gluconate 595 (99 K) MG Tabs tablet Take 595 mg by mouth daily.   pregabalin 200 MG capsule Commonly known as: LYRICA Take 200 mg by mouth in the morning and at bedtime.   simvastatin 20 MG tablet Commonly known as: ZOCOR Take 1 tablet (20 mg total) by mouth at bedtime.        Diagnostic Studies: DG Knee Right Port  Result Date: 09/12/2021 CLINICAL DATA:  Status post right knee arthroplasty EXAM: PORTABLE RIGHT KNEE - 1-2 VIEW COMPARISON:  10/09/2019 from [facility]. FINDINGS: Right knee arthroplasty. Expected anterior subcutaneous and intra-articular gas and edema/fluid. No periprosthetic fracture or acute hardware complication identified. IMPRESSION: Expected appearance after right knee arthroplasty. Electronically Signed   By: Abigail Miyamoto M.D.   On: 09/12/2021 13:52    Disposition: Discharge disposition: 01-Home or Self Care       Discharge Instructions     Call MD / Call 911   Complete by: As directed    If you experience chest pain or shortness of breath, CALL 911 and be transported to the hospital emergency room.  If you develope a fever above 101 F, pus (white drainage) or increased drainage or redness at the wound, or calf pain, call your surgeon's office.   Constipation Prevention   Complete by: As directed    Drink plenty of fluids.  Prune  juice may be helpful.  You may use a stool softener, such as Colace (over the counter) 100 mg twice a day.  Use MiraLax (over the counter) for constipation as needed.   Diet - low sodium heart healthy   Complete by: As directed    Do not put a pillow under the knee. Place it under the heel.   Complete by: As directed    Increase activity slowly as tolerated   Complete by: As directed    Post-operative opioid taper instructions:   Complete by: As directed  POST-OPERATIVE OPIOID TAPER INSTRUCTIONS: It is important to wean off of your opioid medication as soon as possible. If you do not need pain medication after your surgery it is ok to stop day one. Opioids include: Codeine, Hydrocodone(Norco, Vicodin), Oxycodone(Percocet, oxycontin) and hydromorphone amongst others.  Long term and even short term use of opiods can cause: Increased pain response Dependence Constipation Depression Respiratory depression And more.  Withdrawal symptoms can include Flu like symptoms Nausea, vomiting And more Techniques to manage these symptoms Hydrate well Eat regular healthy meals Stay active Use relaxation techniques(deep breathing, meditating, yoga) Do Not substitute Alcohol to help with tapering If you have been on opioids for less than two weeks and do not have pain than it is ok to stop all together.  Plan to wean off of opioids This plan should start within one week post op of your joint replacement. Maintain the same interval or time between taking each dose and first decrease the dose.  Cut the total daily intake of opioids by one tablet each day Next start to increase the time between doses. The last dose that should be eliminated is the evening dose.           Follow-up Information     Willaim Sheng, MD. Go on 09/22/2021.   Specialty: Orthopedic Surgery Why: Your appointment is at 4:15. Contact information: [address]  [address] [phone]         [facility] [place] Follow up.   Specialty: [place] Why: HHPT will provide 6 home visits prior to starting outpatient physical therapy Contact information: [address] [place] [address] [phone]         [facility], [place]. Go on 09/29/2021.   Why: Your outpatient physical therapy appointment is at 3:00. Please arrive at 2:45 to complete your paperwork Contact information: [facility] [place] [address] [phone]                    Discharge Instructions      INSTRUCTIONS AFTER JOINT REPLACEMENT   Remove items at home which could result in a fall. This includes throw rugs or furniture in walking pathways ICE to the affected joint every three hours while awake for 30 minutes at a time, for at least the first 3-5 days, and then as needed for pain and swelling.  Continue to use ice for pain and swelling. You may notice swelling that will progress down to the foot and ankle.  This is normal after surgery.  Elevate your leg when you are not up walking on it.   Continue to use the breathing machine you got in the hospital (incentive spirometer) which will help keep your temperature down.  It is common for your temperature to cycle up and down following surgery, especially at night when you are not up moving around and exerting yourself.  The breathing machine keeps your lungs expanded and your temperature down.   DIET:  As you were doing prior to hospitalization, we recommend a well-balanced diet.  DRESSING / WOUND CARE / SHOWERING  Keep the surgical dressing until follow up.  The dressing is water proof, so you can shower without any extra covering.  IF THE DRESSING FALLS OFF or the wound gets wet inside, change the dressing with sterile gauze.  Please use good hand washing techniques before changing the dressing.  Do not use any lotions or  creams  on the incision until instructed by your surgeon.    ACTIVITY  Increase activity slowly as tolerated, but follow the weight bearing instructions below.   No driving for 6 weeks or until further direction given by your physician.  You cannot drive while taking narcotics.  No lifting or carrying greater than 10 lbs. until further directed by your surgeon. Avoid periods of inactivity such as sitting longer than an hour when not asleep. This helps prevent blood clots.  You may return to work once you are authorized by your doctor.     WEIGHT BEARING   Weight bearing as tolerated with assist device (walker, cane, etc) as directed, use it as long as suggested by your surgeon or therapist, typically at least 4-6 weeks.   EXERCISES  Results after joint replacement surgery are often greatly improved when you follow the exercise, range of motion and muscle strengthening exercises prescribed by your doctor. Safety measures are also important to protect the joint from further injury. Any time any of these exercises cause you to have increased pain or swelling, decrease what you are doing until you are comfortable again and then slowly increase them. If you have problems or questions, call your caregiver or physical therapist for advice.   Rehabilitation is important following a joint replacement. After just a few days of immobilization, the muscles of the leg can become weakened and shrink (atrophy).  These exercises are designed to build up the tone and strength of the thigh and leg muscles and to improve motion. Often times heat used for twenty to thirty minutes before working out will loosen up your tissues and help with improving the range of motion but do not use heat for the first two weeks following surgery (sometimes heat can increase post-operative swelling).   These exercises can be done on a training (exercise) mat, on the floor, on a table or on a bed. Use whatever works the best and is most  comfortable for you.    Use music or television while you are exercising so that the exercises are a pleasant break in your day. This will make your life better with the exercises acting as a break in your routine that you can look forward to.   Perform all exercises about fifteen times, three times per day or as directed.  You should exercise both the operative leg and the other leg as well.  Exercises include:   Quad Sets - Tighten up the muscle on the front of the thigh (Quad) and hold for 5-10 seconds.   Straight Leg Raises - With your knee straight (if you were given a brace, keep it on), lift the leg to 60 degrees, hold for 3 seconds, and slowly lower the leg.  Perform this exercise against resistance later as your leg gets stronger.  Leg Slides: Lying on your back, slowly slide your foot toward your buttocks, bending your knee up off the floor (only go as far as is comfortable). Then slowly slide your foot back down until your leg is flat on the floor again.  Angel Wings: Lying on your back spread your legs to the side as far apart as you can without causing discomfort.  Hamstring Strength:  Lying on your back, push your heel against the floor with your leg straight by tightening up the muscles of your buttocks.  Repeat, but this time bend your knee to a comfortable angle, and push your heel against the floor.  You may put  a pillow under the heel to make it more comfortable if necessary.   A rehabilitation program following joint replacement surgery can speed recovery and prevent re-injury in the future due to weakened muscles. Contact your doctor or a physical therapist for more information on knee rehabilitation.    CONSTIPATION  Constipation is defined medically as fewer than three stools per week and severe constipation as less than one stool per week.  Even if you have a regular bowel pattern at home, your normal regimen is likely to be disrupted due to multiple reasons following surgery.   Combination of anesthesia, postoperative narcotics, change in appetite and fluid intake all can affect your bowels.   YOU MUST use at least one of the following options; they are listed in order of increasing strength to get the job done.  They are all available over the counter, and you may need to use some, POSSIBLY even all of these options:    Drink plenty of fluids (prune juice may be helpful) and high fiber foods Colace 100 mg by mouth twice a day  Senokot for constipation as directed and as needed Dulcolax (bisacodyl), take with full glass of water  Miralax (polyethylene glycol) once or twice a day as needed.  If you have tried all these things and are unable to have a bowel movement in the first 3-4 days after surgery call either your surgeon or your primary doctor.    If you experience loose stools or diarrhea, hold the medications until you stool forms back up.  If your symptoms do not get better within 1 week or if they get worse, check with your doctor.  If you experience "the worst abdominal pain ever" or develop nausea or vomiting, please contact the office immediately for further recommendations for treatment.   ITCHING:  If you experience itching with your medications, try taking only a single pain pill, or even half a pain pill at a time.  You can also use Benadryl over the counter for itching or also to help with sleep.   TED HOSE STOCKINGS:  Use stockings on both legs until for at least 2 weeks or as directed by physician office. They may be removed at night for sleeping.  MEDICATIONS:  See your medication summary on the "After Visit Summary" that nursing will review with you.  You may have some home medications which will be placed on hold until you complete the course of blood thinner medication.  It is important for you to complete the blood thinner medication as prescribed.   Blood clot prevention (DVT Prophylaxis): After surgery you are at an increased risk for a blood  clot. you will resume eliquis 2.'5mg'$  twice daily for day 1 and day 2 after surgery. Then resume your full dose eliquis on the th third day after surgery. This will help reduce your risk of getting a blood clot. This will help prevent a blood clot. Signs of a pulmonary embolus (blood clot in the lungs) include sudden short of breath, feeling lightheaded or dizzy, chest pain with a deep breath, rapid pulse rapid breathing. Signs of a blood clot in your arms or legs include new unexplained swelling and cramping, warm, red or darkened skin around the painful area. Please call the office or 911 right away if these signs or symptoms develop.  PRECAUTIONS:  If you experience chest pain or shortness of breath - call 911 immediately for transfer to the hospital emergency department.   If you develop a  fever greater that 101 F, purulent drainage from wound, increased redness or drainage from wound, foul odor from the wound/dressing, or calf pain - CONTACT YOUR SURGEON.                                                   FOLLOW-UP APPOINTMENTS:  If you do not already have a post-op appointment, please call the office for an appointment to be seen by your surgeon.  Guidelines for how soon to be seen are listed in your "After Visit Summary", but are typically between 2-3 weeks after surgery.    POST-OPERATIVE OPIOID TAPER INSTRUCTIONS: It is important to wean off of your opioid medication as soon as possible. If you do not need pain medication after your surgery it is ok to stop day one. Opioids include: Codeine, Hydrocodone(Norco, Vicodin), Oxycodone(Percocet, oxycontin) and hydromorphone amongst others.  Long term and even short term use of opiods can cause: Increased pain response Dependence Constipation Depression Respiratory depression And more.  Withdrawal symptoms can include Flu like symptoms Nausea, vomiting And more Techniques to manage these symptoms Hydrate well Eat regular healthy meals Stay  active Use relaxation techniques(deep breathing, meditating, yoga) Do Not substitute Alcohol to help with tapering If you have been on opioids for less than two weeks and do not have pain than it is ok to stop all together.  Plan to wean off of opioids This plan should start within one week post op of your joint replacement. Maintain the same interval or time between taking each dose and first decrease the dose.  Cut the total daily intake of opioids by one tablet each day Next start to increase the time between doses. The last dose that should be eliminated is the evening dose.   MAKE SURE YOU:  Understand these instructions.  Get help right away if you are not doing well or get worse.    Thank you for letting us be a part of your medical care team.  It is a privilege we respect greatly.  We hope these instructions will help you stay on track for a fast and full recovery!          Signed: Kennan Detter A Shianna Bally 09/14/2021, 8:22 AM

## 2021-09-16 DIAGNOSIS — E039 Hypothyroidism, unspecified: Secondary | ICD-10-CM | POA: Diagnosis not present

## 2021-09-16 DIAGNOSIS — G629 Polyneuropathy, unspecified: Secondary | ICD-10-CM | POA: Diagnosis not present

## 2021-09-16 DIAGNOSIS — E785 Hyperlipidemia, unspecified: Secondary | ICD-10-CM | POA: Diagnosis not present

## 2021-09-16 DIAGNOSIS — I4891 Unspecified atrial fibrillation: Secondary | ICD-10-CM | POA: Diagnosis not present

## 2021-09-16 DIAGNOSIS — I10 Essential (primary) hypertension: Secondary | ICD-10-CM | POA: Diagnosis not present

## 2021-09-16 DIAGNOSIS — Z471 Aftercare following joint replacement surgery: Secondary | ICD-10-CM | POA: Diagnosis not present

## 2021-09-17 DIAGNOSIS — G629 Polyneuropathy, unspecified: Secondary | ICD-10-CM | POA: Diagnosis not present

## 2021-09-17 DIAGNOSIS — E785 Hyperlipidemia, unspecified: Secondary | ICD-10-CM | POA: Diagnosis not present

## 2021-09-17 DIAGNOSIS — I10 Essential (primary) hypertension: Secondary | ICD-10-CM | POA: Diagnosis not present

## 2021-09-17 DIAGNOSIS — I4891 Unspecified atrial fibrillation: Secondary | ICD-10-CM | POA: Diagnosis not present

## 2021-09-17 DIAGNOSIS — E039 Hypothyroidism, unspecified: Secondary | ICD-10-CM | POA: Diagnosis not present

## 2021-09-17 DIAGNOSIS — Z471 Aftercare following joint replacement surgery: Secondary | ICD-10-CM | POA: Diagnosis not present

## 2021-09-19 DIAGNOSIS — I4891 Unspecified atrial fibrillation: Secondary | ICD-10-CM | POA: Diagnosis not present

## 2021-09-19 DIAGNOSIS — I10 Essential (primary) hypertension: Secondary | ICD-10-CM | POA: Diagnosis not present

## 2021-09-19 DIAGNOSIS — G629 Polyneuropathy, unspecified: Secondary | ICD-10-CM | POA: Diagnosis not present

## 2021-09-19 DIAGNOSIS — Z471 Aftercare following joint replacement surgery: Secondary | ICD-10-CM | POA: Diagnosis not present

## 2021-09-19 DIAGNOSIS — E785 Hyperlipidemia, unspecified: Secondary | ICD-10-CM | POA: Diagnosis not present

## 2021-09-19 DIAGNOSIS — E039 Hypothyroidism, unspecified: Secondary | ICD-10-CM | POA: Diagnosis not present

## 2021-09-21 DIAGNOSIS — I10 Essential (primary) hypertension: Secondary | ICD-10-CM | POA: Diagnosis not present

## 2021-09-21 DIAGNOSIS — E039 Hypothyroidism, unspecified: Secondary | ICD-10-CM | POA: Diagnosis not present

## 2021-09-21 DIAGNOSIS — Z471 Aftercare following joint replacement surgery: Secondary | ICD-10-CM | POA: Diagnosis not present

## 2021-09-21 DIAGNOSIS — I4891 Unspecified atrial fibrillation: Secondary | ICD-10-CM | POA: Diagnosis not present

## 2021-09-21 DIAGNOSIS — G629 Polyneuropathy, unspecified: Secondary | ICD-10-CM | POA: Diagnosis not present

## 2021-09-21 DIAGNOSIS — E785 Hyperlipidemia, unspecified: Secondary | ICD-10-CM | POA: Diagnosis not present

## 2021-09-22 ENCOUNTER — Ambulatory Visit (HOSPITAL_COMMUNITY): Payer: Medicare Other | Admitting: Physician Assistant

## 2021-09-26 DIAGNOSIS — G629 Polyneuropathy, unspecified: Secondary | ICD-10-CM | POA: Diagnosis not present

## 2021-09-26 DIAGNOSIS — E785 Hyperlipidemia, unspecified: Secondary | ICD-10-CM | POA: Diagnosis not present

## 2021-09-26 DIAGNOSIS — Z471 Aftercare following joint replacement surgery: Secondary | ICD-10-CM | POA: Diagnosis not present

## 2021-09-26 DIAGNOSIS — E039 Hypothyroidism, unspecified: Secondary | ICD-10-CM | POA: Diagnosis not present

## 2021-09-26 DIAGNOSIS — I4891 Unspecified atrial fibrillation: Secondary | ICD-10-CM | POA: Diagnosis not present

## 2021-09-26 DIAGNOSIS — I10 Essential (primary) hypertension: Secondary | ICD-10-CM | POA: Diagnosis not present

## 2021-09-27 ENCOUNTER — Encounter (HOSPITAL_COMMUNITY): Payer: Self-pay

## 2021-09-27 ENCOUNTER — Ambulatory Visit (HOSPITAL_COMMUNITY): Payer: Medicare Other | Admitting: Physician Assistant

## 2021-09-29 DIAGNOSIS — M25661 Stiffness of right knee, not elsewhere classified: Secondary | ICD-10-CM | POA: Diagnosis not present

## 2021-09-29 DIAGNOSIS — R262 Difficulty in walking, not elsewhere classified: Secondary | ICD-10-CM | POA: Diagnosis not present

## 2021-09-29 DIAGNOSIS — M6281 Muscle weakness (generalized): Secondary | ICD-10-CM | POA: Diagnosis not present

## 2021-09-29 DIAGNOSIS — Z96652 Presence of left artificial knee joint: Secondary | ICD-10-CM | POA: Diagnosis not present

## 2021-09-29 DIAGNOSIS — M1711 Unilateral primary osteoarthritis, right knee: Secondary | ICD-10-CM | POA: Diagnosis not present

## 2021-10-05 DIAGNOSIS — M6281 Muscle weakness (generalized): Secondary | ICD-10-CM | POA: Diagnosis not present

## 2021-10-05 DIAGNOSIS — Z96652 Presence of left artificial knee joint: Secondary | ICD-10-CM | POA: Diagnosis not present

## 2021-10-05 DIAGNOSIS — M1711 Unilateral primary osteoarthritis, right knee: Secondary | ICD-10-CM | POA: Diagnosis not present

## 2021-10-05 DIAGNOSIS — M25661 Stiffness of right knee, not elsewhere classified: Secondary | ICD-10-CM | POA: Diagnosis not present

## 2021-10-05 DIAGNOSIS — R262 Difficulty in walking, not elsewhere classified: Secondary | ICD-10-CM | POA: Diagnosis not present

## 2021-10-07 DIAGNOSIS — M6281 Muscle weakness (generalized): Secondary | ICD-10-CM | POA: Diagnosis not present

## 2021-10-07 DIAGNOSIS — M25661 Stiffness of right knee, not elsewhere classified: Secondary | ICD-10-CM | POA: Diagnosis not present

## 2021-10-07 DIAGNOSIS — Z96652 Presence of left artificial knee joint: Secondary | ICD-10-CM | POA: Diagnosis not present

## 2021-10-07 DIAGNOSIS — M1711 Unilateral primary osteoarthritis, right knee: Secondary | ICD-10-CM | POA: Diagnosis not present

## 2021-10-07 DIAGNOSIS — R262 Difficulty in walking, not elsewhere classified: Secondary | ICD-10-CM | POA: Diagnosis not present

## 2021-10-10 ENCOUNTER — Ambulatory Visit (HOSPITAL_COMMUNITY)
Admission: RE | Admit: 2021-10-10 | Discharge: 2021-10-10 | Disposition: A | Discharge status: Home / Self Care | Payer: Medicare Other | Source: Ambulatory Visit | Attending: Physician Assistant | Admitting: Physician Assistant

## 2021-10-10 ENCOUNTER — Encounter (HOSPITAL_COMMUNITY): Payer: Self-pay | Admitting: Physician Assistant

## 2021-10-10 VITALS — BP 132/74 | HR 68 | Ht 66.0 in | Wt 193.6 lb

## 2021-10-10 DIAGNOSIS — C44712 Basal cell carcinoma of skin of right lower limb, including hip: Secondary | ICD-10-CM | POA: Diagnosis not present

## 2021-10-10 DIAGNOSIS — I38 Endocarditis, valve unspecified: Secondary | ICD-10-CM | POA: Insufficient documentation

## 2021-10-10 DIAGNOSIS — I10 Essential (primary) hypertension: Secondary | ICD-10-CM | POA: Insufficient documentation

## 2021-10-10 DIAGNOSIS — E669 Obesity, unspecified: Secondary | ICD-10-CM | POA: Insufficient documentation

## 2021-10-10 DIAGNOSIS — K130 Diseases of lips: Secondary | ICD-10-CM | POA: Diagnosis not present

## 2021-10-10 DIAGNOSIS — I4819 Other persistent atrial fibrillation: Secondary | ICD-10-CM | POA: Insufficient documentation

## 2021-10-10 DIAGNOSIS — D485 Neoplasm of uncertain behavior of skin: Secondary | ICD-10-CM | POA: Diagnosis not present

## 2021-10-10 DIAGNOSIS — D6869 Other thrombophilia: Secondary | ICD-10-CM | POA: Diagnosis not present

## 2021-10-10 DIAGNOSIS — E785 Hyperlipidemia, unspecified: Secondary | ICD-10-CM | POA: Diagnosis not present

## 2021-10-10 DIAGNOSIS — Z6831 Body mass index (BMI) 31.0-31.9, adult: Secondary | ICD-10-CM | POA: Diagnosis not present

## 2021-10-10 DIAGNOSIS — D1801 Hemangioma of skin and subcutaneous tissue: Secondary | ICD-10-CM | POA: Diagnosis not present

## 2021-10-10 DIAGNOSIS — L570 Actinic keratosis: Secondary | ICD-10-CM | POA: Diagnosis not present

## 2021-10-10 DIAGNOSIS — Z7901 Long term (current) use of anticoagulants: Secondary | ICD-10-CM | POA: Diagnosis not present

## 2021-10-10 LAB — MAGNESIUM: Magnesium: 2.1 mg/dL (ref 1.7–2.4)

## 2021-10-10 MED ORDER — DOFETILIDE 250 MCG PO CAPS: 250.0000 ug | ORAL_CAPSULE | Freq: Two times a day (BID) | ORAL | 3 refills | Status: DC

## 2021-10-10 NOTE — Progress Notes (Signed)
Primary Care Physician: Isaac Bliss, Rayford Halsted, MD Primary Cardiologist: Dr Phineas Inches Primary Electrophysiologist: Dr Curt Bears Referring Physician: Dr Phineas Inches   Brandi Spence is a 76 y.o. female with a history of HTN, hypothyroid, HLD, atrial fibrillation who presents for follow up in the [facility]. The patient was initially diagnosed with atrial fibrillation 11/04/20 at a visit with her PCP although she was not told she had afib at that time. She was unaware of her arrhythmia with no symptoms. She was scheduled for total knee replacement on 01/17/21 but this was cancelled due to patient being in afib. Patient is on Eliquis for a CHADS2VASC score of 4. Seen by Dr Harl Bowie on 01/18/21 and started on BB. Patient is s/p DCCV on 02/08/21. Unfortunately, she was back in afib at follow up. She purchased a Kardia mobile device on 02/11/21 which showed afib at that time. Patient is s/p DCCV on 03/28/21. Her BB was stopped and her flecainide was decreased due to bradycardia. She did feel better in SR with less dyspnea on exertion.   Patient is s/p dofetilide admission 3/7-3/10/23 with DCCV on 05/12/21.   On follow up today, patient reports that she has done well since her last visit. She remains in SR. She was able to finally have her knee replacement surgery. No bleeding issues on anticoagulation.   Today, she denies symptoms of palpitations, chest pain, shortness of breath, orthopnea, PND, lower extremity edema, dizziness, presyncope, syncope, snoring, daytime somnolence, bleeding, or neurologic sequela. The patient is tolerating medications without difficulties and is otherwise without complaint today.    Atrial Fibrillation Risk Factors:  she does not have symptoms or diagnosis of sleep apnea. she does not have a history of rheumatic fever. she does not have a history of alcohol use. The patient does not have a history of early familial atrial fibrillation or  other arrhythmias.  she has a BMI of Body mass index is 31.25 kg/m.Marland Kitchen Filed Weights   10/10/21 1421  Weight: 87.8 kg    Family History  Problem Relation Age of Onset   Colon cancer Father 45   Colon polyps Father    Esophageal cancer Neg Hx    Rectal cancer Neg Hx    Stomach cancer Neg Hx      Atrial Fibrillation Management history:  Previous antiarrhythmic drugs: flecainide, dofetilide   Previous cardioversions: 02/08/21, 03/28/21, 05/12/21 Previous ablations: none CHADS2VASC score: 4 Anticoagulation history: Eliquis   Past Medical History:  Diagnosis Date   Arthritis    Dysrhythmia    A.fib   Family history of adverse reaction to anesthesia    Mother and sister have PONV   History of kidney stones    Hyperlipidemia    Hypertension    Hypothyroidism    Neuropathy    feet   Pre-diabetes    Primary localized osteoarthritis of right knee 01/05/2021   Past Surgical History:  Procedure Laterality Date   ABDOMINAL HYSTERECTOMY  2005   BREAST ENHANCEMENT SURGERY Bilateral 2000   BUNIONECTOMY Right 2013   CARDIOVERSION N/A 02/08/2021   Procedure: CARDIOVERSION;  Surgeon: Donato Heinz, MD;  Location: [place];  Service: Cardiovascular;  Laterality: N/A;   CARDIOVERSION N/A 03/28/2021   Procedure: CARDIOVERSION;  Surgeon: Jerline Pain, MD;  Location: [place];  Service: Cardiovascular;  Laterality: N/A;   CARDIOVERSION N/A 05/12/2021   Procedure: CARDIOVERSION;  Surgeon: Sanda Klein, MD;  [facility];  Service: Cardiovascular;  Laterality: N/A;  CARPAL TUNNEL RELEASE Right 1997   COLONOSCOPY  01/01/2013   EXCISION MORTON'S NEUROMA Bilateral 1980   right foot, 2004   KNEE ARTHROSCOPY Right 2006   2012   PAROTID GLAND TUMOR EXCISION  2009   POLYPECTOMY     SPINAL CORD STIMULATOR IMPLANT Left 2012   TARSAL TUNNEL RELEASE Right 2005   TONSILLECTOMY     TOTAL KNEE ARTHROPLASTY Right 09/12/2021   Procedure: TOTAL KNEE ARTHROPLASTY;   Surgeon: Willaim Sheng, MD;  Location: [facility];  Service: Orthopedics;  Laterality: Right;    Current Outpatient Medications  Medication Sig Dispense Refill   acetaminophen (TYLENOL) 500 MG tablet Take 2 tablets (1,000 mg total) by mouth every 8 (eight) hours as needed. 90 tablet 0   cetirizine (ZYRTEC) 10 MG tablet Take 10 mg by mouth at bedtime.     diltiazem (CARDIZEM) 30 MG tablet TAKE 1 TABLET EVERY 4 HOURS AS NEEDED FOR HEART RATE >100 30 tablet 1   docusate sodium (COLACE) 100 MG capsule Take 100 mg by mouth at bedtime.     dofetilide (TIKOSYN) 250 MCG capsule Take 1 capsule (250 mcg total) by mouth 2 (two) times daily. 180 capsule 1   ELIQUIS 5 MG TABS tablet Take 2.'5mg'$  twice daily on 7/11 and 7/12. Resume '5mg'$  twice daily on 7/13. 180 tablet 1   FIBER PO Take 1 tablet by mouth at bedtime.     furosemide (LASIX) 20 MG tablet TAKE 1 TABLET (20 MG TOTAL) BY MOUTH DAILY AS NEEDED FOR EDEMA. 30 tablet 4   levothyroxine (SYNTHROID, LEVOTHROID) 75 MCG tablet Take 75 mcg by mouth daily before breakfast.     MELATONIN PO Take 1 tablet by mouth at bedtime.     metoprolol succinate (TOPROL-XL) 25 MG 24 hr tablet Take 12.5 mg by mouth daily.     OVER THE COUNTER MEDICATION Take 2 tablets by mouth daily. BondiBoost Hair Growth Supplement     oxyCODONE (OXY IR/ROXICODONE) 5 MG immediate release tablet Take 5 mg by mouth every 8 (eight) hours as needed.     potassium gluconate 595 (99 K) MG TABS tablet Take 595 mg by mouth daily.     pregabalin (LYRICA) 200 MG capsule Take 200 mg by mouth in the morning and at bedtime.     simvastatin (ZOCOR) 20 MG tablet Take 1 tablet (20 mg total) by mouth at bedtime. 90 tablet 1   No current facility-administered medications for this encounter.    Allergies  Allergen Reactions   Prednisone Other (See Comments)    Could not sleep and agitated    Social History   Socioeconomic History   Marital status: Divorced    Spouse name: Not on file   Number  of children: Not on file   Years of education: Not on file   Highest education level: Not on file  Occupational History   Not on file  Tobacco Use   Smoking status: Former    Packs/day: 1.00    Years: 5.00    Total pack years: 5.00    Types: Cigarettes    Quit date: 06/26/1980    Years since quitting: 41.3   Smokeless tobacco: Never   Tobacco comments:    Former smoker 03/02/21  Vaping Use   Vaping Use: Never used  Substance and Sexual Activity   Alcohol use: Yes    Alcohol/week: 1.0 standard drink of alcohol    Types: 1 Standard drinks or equivalent per week    Comment:  once a month 03/02/21   Drug use: No   Sexual activity: Not on file  Other Topics Concern   Not on file  Social History Narrative   Not on file   Social Determinants of Health   Financial Resource Strain: Not on file  Food Insecurity: Not on file  Transportation Needs: Not on file  Physical Activity: Not on file  Stress: Not on file  Social Connections: Not on file  Intimate Partner Violence: Not on file     ROS- All systems are reviewed and negative except as per the HPI above.  Physical Exam: Vitals:   10/10/21 1421  BP: 132/74  Pulse: 68  Weight: 87.8 kg  Height: '5\' 6"'$  (1.676 m)     GEN- The patient is a well appearing elderly obese female, alert and oriented x 3 today.   HEENT-head normocephalic, atraumatic, sclera clear, conjunctiva pink, hearing intact, trachea midline. Lungs- Clear to ausculation bilaterally, normal work of breathing Heart- Regular rate and rhythm, no murmurs, rubs or gallops  GI- soft, NT, ND, + BS Extremities- no clubbing, cyanosis, or edema MS- no significant deformity or atrophy Skin- no rash or lesion Psych- euthymic mood, full affect Neuro- strength and sensation are intact   Wt Readings from Last 3 Encounters:  10/10/21 87.8 kg  09/12/21 88.5 kg  08/30/21 88.5 kg    EKG today demonstrates  SR Vent. rate 68 BPM PR interval 158 ms QRS duration 70  ms QT/QTcB 414/440 ms  Echo 02/01/21 demonstrated  1. Left ventricular ejection fraction, by estimation, is 55 to 60%. The  left ventricle has normal function. The left ventricle has no regional  wall motion abnormalities. Left ventricular diastolic function could not  be evaluated.   2. Right ventricular systolic function is low normal. The right  ventricular size is normal. There is moderately elevated pulmonary artery systolic pressure. The estimated right ventricular systolic pressure is 57.3 mmHg.   3. Left atrial size was moderately dilated.   4. The mitral valve is grossly normal. Mild to moderate mitral valve  regurgitation.   5. The aortic valve is tricuspid. Aortic valve regurgitation is not  visualized.   6. The inferior vena cava is dilated in size with <50% respiratory  variability, suggesting right atrial pressure of 15 mmHg.   Comparison(s): No prior Echocardiogram.   Epic records are reviewed at length today  CHA2DS2-VASc Score = 4  The patient's score is based upon: CHF History: 0 HTN History: 1 Diabetes History: 0 Stroke History: 0 Vascular Disease History: 0 Age Score: 2 Gender Score: 1       ASSESSMENT AND PLAN: 1. Persistent Atrial Fibrillation (ICD10:  I48.19) The patient's CHA2DS2-VASc score is 4, indicating a 4.8% annual risk of stroke.   S/p dofetilide admission 3/7-3/10/23 with DCCV on 05/12/21 Patient appears to be maintaining SR. Continue dofetilide 250 mcg BID. QT stable. Check bmet/mag today. Continue Eliquis 5 mg BID Continue metoprolol 12.5 mg daily Change diltiazem 30 mg to PRN q 4 hours for heart racing.  Kardia for home monitoring.  If she fails dofetilide, she is agreeable to discussing ablation.   2. Secondary Hypercoagulable State (ICD10:  D68.69) The patient is at significant risk for stroke/thromboembolism based upon her CHA2DS2-VASc Score of 4.  Continue Apixaban (Eliquis).   3. Obesity Body mass index is 31.25  kg/m. Lifestyle modification was discussed and encouraged including regular physical activity and weight reduction.  4. HTN Stable, no changes today.  5. Valvular heart  disease Mild to moderate MR   Follow up in the AF clinic in 3 months. Will also have her follow up with her primary cardiologist Dr Harl Bowie.   [facility] [address] [place], [place] [address] [phone] 10/10/2021 2:32 PM

## 2021-10-11 DIAGNOSIS — M6281 Muscle weakness (generalized): Secondary | ICD-10-CM | POA: Diagnosis not present

## 2021-10-11 DIAGNOSIS — R262 Difficulty in walking, not elsewhere classified: Secondary | ICD-10-CM | POA: Diagnosis not present

## 2021-10-11 DIAGNOSIS — M1711 Unilateral primary osteoarthritis, right knee: Secondary | ICD-10-CM | POA: Diagnosis not present

## 2021-10-11 DIAGNOSIS — M25661 Stiffness of right knee, not elsewhere classified: Secondary | ICD-10-CM | POA: Diagnosis not present

## 2021-10-11 DIAGNOSIS — Z96652 Presence of left artificial knee joint: Secondary | ICD-10-CM | POA: Diagnosis not present

## 2021-10-13 DIAGNOSIS — M6281 Muscle weakness (generalized): Secondary | ICD-10-CM | POA: Diagnosis not present

## 2021-10-13 DIAGNOSIS — M25661 Stiffness of right knee, not elsewhere classified: Secondary | ICD-10-CM | POA: Diagnosis not present

## 2021-10-13 DIAGNOSIS — R262 Difficulty in walking, not elsewhere classified: Secondary | ICD-10-CM | POA: Diagnosis not present

## 2021-10-13 DIAGNOSIS — Z96652 Presence of left artificial knee joint: Secondary | ICD-10-CM | POA: Diagnosis not present

## 2021-10-13 DIAGNOSIS — M1711 Unilateral primary osteoarthritis, right knee: Secondary | ICD-10-CM | POA: Diagnosis not present

## 2021-10-18 DIAGNOSIS — Z96652 Presence of left artificial knee joint: Secondary | ICD-10-CM | POA: Diagnosis not present

## 2021-10-18 DIAGNOSIS — R262 Difficulty in walking, not elsewhere classified: Secondary | ICD-10-CM | POA: Diagnosis not present

## 2021-10-18 DIAGNOSIS — M25661 Stiffness of right knee, not elsewhere classified: Secondary | ICD-10-CM | POA: Diagnosis not present

## 2021-10-18 DIAGNOSIS — M6281 Muscle weakness (generalized): Secondary | ICD-10-CM | POA: Diagnosis not present

## 2021-10-18 DIAGNOSIS — M1711 Unilateral primary osteoarthritis, right knee: Secondary | ICD-10-CM | POA: Diagnosis not present

## 2021-10-26 ENCOUNTER — Other Ambulatory Visit: Payer: Self-pay

## 2021-10-26 DIAGNOSIS — M25661 Stiffness of right knee, not elsewhere classified: Secondary | ICD-10-CM | POA: Diagnosis not present

## 2021-10-26 DIAGNOSIS — R262 Difficulty in walking, not elsewhere classified: Secondary | ICD-10-CM | POA: Diagnosis not present

## 2021-10-26 DIAGNOSIS — M6281 Muscle weakness (generalized): Secondary | ICD-10-CM | POA: Diagnosis not present

## 2021-10-26 DIAGNOSIS — M1711 Unilateral primary osteoarthritis, right knee: Secondary | ICD-10-CM | POA: Diagnosis not present

## 2021-10-26 DIAGNOSIS — Z96652 Presence of left artificial knee joint: Secondary | ICD-10-CM | POA: Diagnosis not present

## 2021-10-26 NOTE — Patient Outreach (Signed)
  Care Coordination   10/26/2021 Name: Brandi Spence MRN: [id number] DOB: 11-06-1945   Care Coordination Outreach Attempts:  An unsuccessful telephone outreach was attempted today to offer the patient information about available care coordination services as a benefit of their health plan.   Follow Up Plan:  Additional outreach attempts will be made to offer the patient care coordination information and services.   Encounter Outcome:  No Answer  Care Coordination Interventions Activated:  No   Care Coordination Interventions:  No, not indicated    Peter Garter RN, BSN,CCM, [facility] [phone]

## 2021-10-27 ENCOUNTER — Other Ambulatory Visit: Payer: Self-pay

## 2021-10-27 DIAGNOSIS — M1711 Unilateral primary osteoarthritis, right knee: Secondary | ICD-10-CM | POA: Diagnosis not present

## 2021-10-27 NOTE — Patient Outreach (Signed)
  Care Coordination   Initial Visit Note   10/27/2021 Name: JALAINA SALYERS MRN: [id number] DOB: 05-05-1945  MERRICK FEUTZ is a 76 y.o. year old female who sees Isaac Bliss, Rayford Halsted, MD for primary care. I spoke with  Cheral Marker by phone today  What matters to the patients health and wellness today?  I am doing well and I have completed PT from my knee surgery    Goals Addressed             This Visit's Progress    COMPLETED: Care Coordination Activities - no follow up required       Care Coordination Interventions: Provided education to patient re: Annual Wellness Visit, care coordination services, PREP program Reviewed medications with patient and discussed adherence Assessed social determinant of health barriers Reviewed to discuss PREP program with A.Fib clinic provider, Discussed getting Annual Wellness Visit but declines to schedule at this time          SDOH assessments and interventions completed:  Yes  SDOH Interventions Today    Flowsheet Row Most Recent Value  SDOH Interventions   Food Insecurity Interventions Intervention Not Indicated  Financial Strain Interventions Intervention Not Indicated  Housing Interventions Intervention Not Indicated  Stress Interventions Intervention Not Indicated  Transportation Interventions Intervention Not Indicated        Care Coordination Interventions Activated:  Yes  Care Coordination Interventions:  Yes, provided   Follow up plan: No further intervention required.   Encounter Outcome:  Pt. Visit Completed  Peter Garter RN, BSN,CCM, CDE Care Management Coordinator [facility] [phone]

## 2021-10-27 NOTE — Patient Instructions (Signed)
Visit Information  Thank you for taking time to visit with me today. Please don't hesitate to contact me if I can be of assistance to you.   Following are the goals we discussed today:   Goals Addressed             This Visit's Progress    COMPLETED: Care Coordination Activities - no follow up required       Care Coordination Interventions: Provided education to patient re: Annual Wellness Visit, care coordination services, PREP program Reviewed medications with patient and discussed adherence Assessed social determinant of health barriers Reviewed to discuss PREP program with A.Fib clinic provider, Discussed getting Annual Wellness Visit but declines to schedule at this time          If you are experiencing a Mental Health or [place] or need someone to talk to, please call the Suicide and Crisis Lifeline: 988 call the [place] National Suicide Prevention Lifeline: 984-548-7924 or TTY: 413-460-3398 TTY 332 377 0730) to talk to a trained counselor call 1-800-273-TALK (toll free, 24 hour hotline) go to [facility] [address], [place] [phone]) call 911   Patient verbalizes understanding of instructions and care plan provided today and agrees to view in [facility]. Active MyChart status and patient understanding of how to access instructions and care plan via MyChart confirmed with patient.     No further follow up required:   Peter Garter RN, Jackquline Denmark, [facility] [phone]

## 2021-11-10 DIAGNOSIS — H903 Sensorineural hearing loss, bilateral: Secondary | ICD-10-CM | POA: Diagnosis not present

## 2021-11-10 DIAGNOSIS — H8102 Meniere's disease, left ear: Secondary | ICD-10-CM | POA: Diagnosis not present

## 2021-11-10 DIAGNOSIS — H9313 Tinnitus, bilateral: Secondary | ICD-10-CM | POA: Diagnosis not present

## 2021-11-10 DIAGNOSIS — G629 Polyneuropathy, unspecified: Secondary | ICD-10-CM | POA: Diagnosis not present

## 2021-11-10 DIAGNOSIS — G5791 Unspecified mononeuropathy of right lower limb: Secondary | ICD-10-CM | POA: Diagnosis not present

## 2021-11-11 DIAGNOSIS — Z1231 Encounter for screening mammogram for malignant neoplasm of breast: Secondary | ICD-10-CM | POA: Diagnosis not present

## 2021-11-11 LAB — HM MAMMOGRAPHY

## 2021-11-16 ENCOUNTER — Encounter: Payer: Self-pay | Admitting: Internal Medicine

## 2021-11-16 DIAGNOSIS — M7742 Metatarsalgia, left foot: Secondary | ICD-10-CM | POA: Diagnosis not present

## 2021-11-16 DIAGNOSIS — G603 Idiopathic progressive neuropathy: Secondary | ICD-10-CM | POA: Diagnosis not present

## 2021-11-16 DIAGNOSIS — G894 Chronic pain syndrome: Secondary | ICD-10-CM | POA: Diagnosis not present

## 2021-11-16 DIAGNOSIS — M7741 Metatarsalgia, right foot: Secondary | ICD-10-CM | POA: Diagnosis not present

## 2021-11-17 DIAGNOSIS — C44712 Basal cell carcinoma of skin of right lower limb, including hip: Secondary | ICD-10-CM | POA: Diagnosis not present

## 2021-11-29 ENCOUNTER — Ambulatory Visit (INDEPENDENT_AMBULATORY_CARE_PROVIDER_SITE_OTHER): Payer: Medicare Other | Admitting: Internal Medicine

## 2021-11-29 ENCOUNTER — Encounter: Payer: Self-pay | Admitting: Internal Medicine

## 2021-11-29 VITALS — BP 118/80 | HR 65 | Temp 98.3°F | Ht 67.0 in | Wt 191.2 lb

## 2021-11-29 DIAGNOSIS — I10 Essential (primary) hypertension: Secondary | ICD-10-CM | POA: Diagnosis not present

## 2021-11-29 DIAGNOSIS — Z0000 Encounter for general adult medical examination without abnormal findings: Secondary | ICD-10-CM

## 2021-11-29 DIAGNOSIS — E039 Hypothyroidism, unspecified: Secondary | ICD-10-CM

## 2021-11-29 DIAGNOSIS — E559 Vitamin D deficiency, unspecified: Secondary | ICD-10-CM

## 2021-11-29 DIAGNOSIS — G894 Chronic pain syndrome: Secondary | ICD-10-CM

## 2021-11-29 DIAGNOSIS — I4819 Other persistent atrial fibrillation: Secondary | ICD-10-CM

## 2021-11-29 DIAGNOSIS — D6869 Other thrombophilia: Secondary | ICD-10-CM

## 2021-11-29 DIAGNOSIS — R7302 Impaired glucose tolerance (oral): Secondary | ICD-10-CM | POA: Diagnosis not present

## 2021-11-29 DIAGNOSIS — Z23 Encounter for immunization: Secondary | ICD-10-CM | POA: Diagnosis not present

## 2021-11-29 DIAGNOSIS — E7800 Pure hypercholesterolemia, unspecified: Secondary | ICD-10-CM

## 2021-11-29 LAB — CBC WITH DIFFERENTIAL/PLATELET
Basophils Absolute: 0 10*3/uL (ref 0.0–0.1)
Basophils Relative: 0.5 % (ref 0.0–3.0)
Eosinophils Absolute: 0.1 10*3/uL (ref 0.0–0.7)
Eosinophils Relative: 2.1 % (ref 0.0–5.0)
HCT: 39 % (ref 36.0–46.0)
Hemoglobin: 13.6 g/dL (ref 12.0–15.0)
Lymphocytes Relative: 34.3 % (ref 12.0–46.0)
Lymphs Abs: 1.7 10*3/uL (ref 0.7–4.0)
MCHC: 34.8 g/dL (ref 30.0–36.0)
MCV: 87.2 fl (ref 78.0–100.0)
Monocytes Absolute: 0.5 10*3/uL (ref 0.1–1.0)
Monocytes Relative: 9.2 % (ref 3.0–12.0)
Neutro Abs: 2.7 10*3/uL (ref 1.4–7.7)
Neutrophils Relative %: 53.9 % (ref 43.0–77.0)
Platelets: 227 10*3/uL (ref 150.0–400.0)
RBC: 4.47 Mil/uL (ref 3.87–5.11)
RDW: 13.6 % (ref 11.5–15.5)
WBC: 5 10*3/uL (ref 4.0–10.5)

## 2021-11-29 LAB — LIPID PANEL
Cholesterol: 173 mg/dL (ref 0–200)
HDL: 52 mg/dL (ref >39.00)
LDL Cholesterol: 85 mg/dL (ref 0–99)
NonHDL: 120.77
Total CHOL/HDL Ratio: 3
Triglycerides: 179 mg/dL — ABNORMAL HIGH (ref 0.0–149.0)
VLDL: 35.8 mg/dL (ref 0.0–40.0)

## 2021-11-29 LAB — MICROALBUMIN / CREATININE URINE RATIO
Creatinine,U: 107.1 mg/dL
Microalb Creat Ratio: 1.2 mg/g (ref 0.0–30.0)
Microalb, Ur: 1.2 mg/dL (ref 0.0–1.9)

## 2021-11-29 LAB — COMPREHENSIVE METABOLIC PANEL
ALT: 27 U/L (ref 0–35)
AST: 23 U/L (ref 0–37)
Albumin: 4.2 g/dL (ref 3.5–5.2)
Alkaline Phosphatase: 72 U/L (ref 39–117)
BUN: 13 mg/dL (ref 6–23)
CO2: 33 mEq/L — ABNORMAL HIGH (ref 19–32)
Calcium: 9.4 mg/dL (ref 8.4–10.5)
Chloride: 103 mEq/L (ref 96–112)
Creatinine, Ser: 0.82 mg/dL (ref 0.40–1.20)
GFR: 69.29 mL/min (ref >60.00)
Glucose, Bld: 104 mg/dL — ABNORMAL HIGH (ref 70–99)
Potassium: 4.5 mEq/L (ref 3.5–5.1)
Sodium: 140 mEq/L (ref 135–145)
Total Bilirubin: 0.5 mg/dL (ref 0.2–1.2)
Total Protein: 6.8 g/dL (ref 6.0–8.3)

## 2021-11-29 LAB — HEMOGLOBIN A1C: Hgb A1c MFr Bld: 5.7 % (ref 4.6–6.5)

## 2021-11-29 LAB — VITAMIN D 25 HYDROXY (VIT D DEFICIENCY, FRACTURES): VITD: 46.23 ng/mL (ref 30.00–100.00)

## 2021-11-29 NOTE — Progress Notes (Signed)
Established Patient Office Visit     CC/Reason for Visit: Subsequent Medicare wellness visit  HPI: Brandi Spence is a 76 y.o. female who is coming in today for the above mentioned reasons. Past Medical History is significant for: Hypertension, hyperlipidemia, hypothyroidism, impaired glucose tolerance.  In the last year she was diagnosed with A-fib and had 3 cardioversions and is now on Tikosyn and Eliquis.  She also had her right knee replaced in July.  She is doing well.  She has routine eye and dental care.  She is due for COVID, flu vaccines.  She states she has completed shingles vaccination.  All cancer screening is up-to-date.   Past Medical/Surgical History: Past Medical History:  Diagnosis Date   Arthritis    Dysrhythmia    A.fib   Family history of adverse reaction to anesthesia    Mother and sister have PONV   History of kidney stones    Hyperlipidemia    Hypertension    Hypothyroidism    Neuropathy    feet   Pre-diabetes    Primary localized osteoarthritis of right knee 01/05/2021    Past Surgical History:  Procedure Laterality Date   ABDOMINAL HYSTERECTOMY  2005   BREAST ENHANCEMENT SURGERY Bilateral 2000   BUNIONECTOMY Right 2013   CARDIOVERSION N/A 02/08/2021   Procedure: CARDIOVERSION;  Surgeon: Donato Heinz, MD;  Location: [facility];  Service: Cardiovascular;  Laterality: N/A;   CARDIOVERSION N/A 03/28/2021   Procedure: CARDIOVERSION;  Surgeon: Jerline Pain, MD;  Location: [facility];  Service: Cardiovascular;  Laterality: N/A;   CARDIOVERSION N/A 05/12/2021   Procedure: CARDIOVERSION;  Surgeon: Sanda Klein, MD;  Location: [facility];  Service: Cardiovascular;  Laterality: N/A;   CARPAL TUNNEL RELEASE Right 1997   COLONOSCOPY  01/01/2013   EXCISION MORTON'S NEUROMA Bilateral 1980   right foot, 2004   KNEE ARTHROSCOPY Right 2006   2012   PAROTID GLAND TUMOR EXCISION  2009   POLYPECTOMY     SPINAL CORD STIMULATOR IMPLANT  Left 2012   TARSAL TUNNEL RELEASE Right 2005   TONSILLECTOMY     TOTAL KNEE ARTHROPLASTY Right 09/12/2021   Procedure: TOTAL KNEE ARTHROPLASTY;  Surgeon: Willaim Sheng, MD;  Location: [facility];  Service: Orthopedics;  Laterality: Right;    Social History:  reports that she quit smoking about 41 years ago. Her smoking use included cigarettes. She has a 5.00 pack-year smoking history. She has never used smokeless tobacco. She reports current alcohol use of about 1.0 standard drink of alcohol per week. She reports that she does not use drugs.  Allergies: Allergies  Allergen Reactions   Prednisone Other (See Comments)    Could not sleep and agitated    Family History:  Family History  Problem Relation Age of Onset   Colon cancer Father 83   Colon polyps Father    Esophageal cancer Neg Hx    Rectal cancer Neg Hx    Stomach cancer Neg Hx      Current Outpatient Medications:    cetirizine (ZYRTEC) 10 MG tablet, Take 10 mg by mouth at bedtime., Disp: , Rfl:    diltiazem (CARDIZEM) 30 MG tablet, TAKE 1 TABLET EVERY 4 HOURS AS NEEDED FOR HEART RATE >100, Disp: 30 tablet, Rfl: 1   docusate sodium (COLACE) 100 MG capsule, Take 100 mg by mouth at bedtime., Disp: , Rfl:    dofetilide (TIKOSYN) 250 MCG capsule, Take 1 capsule (250 mcg total) by mouth 2 (two)  times daily., Disp: 180 capsule, Rfl: 3   ELIQUIS 5 MG TABS tablet, Take 2.'5mg'$  twice daily on 7/11 and 7/12. Resume '5mg'$  twice daily on 7/13., Disp: 180 tablet, Rfl: 1   FIBER PO, Take 1 tablet by mouth at bedtime., Disp: , Rfl:    furosemide (LASIX) 20 MG tablet, TAKE 1 TABLET (20 MG TOTAL) BY MOUTH DAILY AS NEEDED FOR EDEMA., Disp: 30 tablet, Rfl: 4   levothyroxine (SYNTHROID, LEVOTHROID) 75 MCG tablet, Take 75 mcg by mouth daily before breakfast., Disp: , Rfl:    MELATONIN PO, Take 1 tablet by mouth at bedtime., Disp: , Rfl:    metoprolol succinate (TOPROL-XL) 25 MG 24 hr tablet, Take 12.5 mg by mouth daily., Disp: , Rfl:    OVER  THE COUNTER MEDICATION, Take 2 tablets by mouth daily. BondiBoost Hair Growth Supplement, Disp: , Rfl:    oxyCODONE (OXY IR/ROXICODONE) 5 MG immediate release tablet, Take 5 mg by mouth every 8 (eight) hours as needed., Disp: , Rfl:    potassium gluconate 595 (99 K) MG TABS tablet, Take 595 mg by mouth daily., Disp: , Rfl:    pregabalin (LYRICA) 200 MG capsule, Take 200 mg by mouth in the morning and at bedtime., Disp: , Rfl:    simvastatin (ZOCOR) 20 MG tablet, Take 1 tablet (20 mg total) by mouth at bedtime., Disp: 90 tablet, Rfl: 1  Review of Systems:  Constitutional: Denies fever, chills, diaphoresis, appetite change and fatigue.  HEENT: Denies photophobia, eye pain, redness, hearing loss, ear pain, congestion, sore throat, rhinorrhea, sneezing, mouth sores, trouble swallowing, neck pain, neck stiffness and tinnitus.   Respiratory: Denies SOB, DOE, cough, chest tightness,  and wheezing.   Cardiovascular: Denies chest pain, palpitations and leg swelling.  Gastrointestinal: Denies nausea, vomiting, abdominal pain, diarrhea, constipation, blood in stool and abdominal distention.  Genitourinary: Denies dysuria, urgency, frequency, hematuria, flank pain and difficulty urinating.  Endocrine: Denies: hot or cold intolerance, sweats, changes in hair or nails, polyuria, polydipsia. Musculoskeletal: Denies myalgias, back pain, joint swelling, arthralgias and gait problem.  Skin: Denies pallor, rash and wound.  Neurological: Denies dizziness, seizures, syncope, weakness, light-headedness, numbness and headaches.  Hematological: Denies adenopathy. Easy bruising, personal or family bleeding history  Psychiatric/Behavioral: Denies suicidal ideation, mood changes, confusion, nervousness, sleep disturbance and agitation    Physical Exam: Vitals:   11/29/21 1356  BP: 118/80  Pulse: 65  Temp: 98.3 F (36.8 C)  TempSrc: Oral  SpO2: 95%  Weight: 191 lb 3.2 oz (86.7 kg)  Height: '5\' 7"'$  (1.702 m)     Body mass index is 29.95 kg/m.   Constitutional: NAD, calm, comfortable Eyes: PERRL, lids and conjunctivae normal ENMT: Mucous membranes are moist. Posterior pharynx clear of any exudate or lesions. Normal dentition. Tympanic membrane is pearly white, no erythema or bulging. Neck: normal, supple, no masses, no thyromegaly Respiratory: clear to auscultation bilaterally, no wheezing, no crackles. Normal respiratory effort. No accessory muscle use.  Cardiovascular: Regular rate and rhythm, no murmurs / rubs / gallops. No extremity edema. 2+ pedal pulses. No carotid bruits.  Abdomen: no tenderness, no masses palpated. No hepatosplenomegaly. Bowel sounds positive.  Musculoskeletal: no clubbing / cyanosis. No joint deformity upper and lower extremities. Good ROM, no contractures. Normal muscle tone.  Skin: no rashes, lesions, ulcers. No induration Neurologic: CN 2-12 grossly intact. Sensation intact, DTR normal. Strength 5/5 in all 4.  Psychiatric: Normal judgment and insight. Alert and oriented x 3. Normal mood.    Subsequent Medicare wellness  visit   1. Risk factors, based on past  M,S,F -cardiovascular disease risk factors include age, history of hypertension and hyperlipidemia   2.  Physical activities: Starting to increase physical activity after knee replacement   3.  Depression/mood: Stable, not depressed   4.  Hearing: Chronic hearing difficulty followed by ENT   5.  ADL's: Independent in all ADLs   6.  Fall risk: Low fall risk   7.  Home safety: No problems identified   8.  Height weight, and visual acuity: height and weight as above, vision:  Vision Screening   Right eye Left eye Both eyes  Without correction     With correction '20/30 20/30 20/30 '$     9.  Counseling: To update vaccination status   10. Lab orders based on risk factors: Laboratory update will be reviewed   11. Referral : None today   12. Care plan: Follow-up with me in 6 months   13. Cognitive  assessment: No cognitive impairment   14. Screening: Patient provided with a written and personalized 5-10 year screening schedule in the AVS. yes   15. Provider List Update: PCP, cardiologist, orthopedist  14. Advance Directives: Full code   17. Opioids: She is on chronic opioids followed by pain management specialist.  Has not displayed any signs of an opioid-use disorder.   [place] Office Visit from 11/29/2021 in [facility]  PHQ-9 Total Score 1          09/12/2021    8:43 AM 09/12/2021    4:34 PM 09/12/2021    7:25 PM 09/13/2021    8:00 AM 11/29/2021    1:52 PM  Fall Risk  Falls in the past year?     0  Was there an injury with Fall?     0  Fall Risk Category Calculator     0  Fall Risk Category     Low  Patient Fall Risk Level Moderate fall risk High fall risk High fall risk High fall risk Low fall risk  Fall risk Follow up     Falls evaluation completed     Impression and Plan:  Medicare annual wellness visit, subsequent  IGT (impaired glucose tolerance) - Plan: Microalbumin/Creatinine Ratio, Urine, Hemoglobin A1c, Hemoglobin A1c  Needs flu shot - Plan: Flu Vaccine QUAD High Dose(Fluad)  Persistent atrial fibrillation (HCC)  Secondary hypercoagulable state ([place])  Hypothyroidism, unspecified type  Vitamin D deficiency - Plan: VITAMIN D 25 Hydroxy (Vit-D Deficiency, Fractures), VITAMIN D 25 Hydroxy (Vit-D Deficiency, Fractures)  Chronic pain syndrome  Hypercholesterolemia - Plan: Lipid panel, Lipid panel  Essential hypertension - Plan: CBC with Differential/Platelet, Comprehensive metabolic panel, Comprehensive metabolic panel, CBC with Differential/Platelet    -Recommend routine eye and dental care. -Immunizations: Flu vaccine in office today, she will get COVID at pharmacy, states she has completed both shingles -Healthy lifestyle discussed in detail. -Labs to be updated today. -Colon cancer screening: 02/2018 -Breast cancer  screening: 11/2021 -Cervical cancer screening: Declines due to age -Lung cancer screening: Not applicable -Prostate cancer screening: Not applicable -DEXA: 0/6301    Lelon Frohlich, MD [facility]

## 2021-12-08 DIAGNOSIS — M25511 Pain in right shoulder: Secondary | ICD-10-CM | POA: Diagnosis not present

## 2021-12-14 DIAGNOSIS — Z79891 Long term (current) use of opiate analgesic: Secondary | ICD-10-CM | POA: Diagnosis not present

## 2021-12-14 DIAGNOSIS — G894 Chronic pain syndrome: Secondary | ICD-10-CM | POA: Diagnosis not present

## 2021-12-14 DIAGNOSIS — G603 Idiopathic progressive neuropathy: Secondary | ICD-10-CM | POA: Diagnosis not present

## 2021-12-14 DIAGNOSIS — M7742 Metatarsalgia, left foot: Secondary | ICD-10-CM | POA: Diagnosis not present

## 2021-12-14 DIAGNOSIS — M7741 Metatarsalgia, right foot: Secondary | ICD-10-CM | POA: Diagnosis not present

## 2022-01-05 ENCOUNTER — Other Ambulatory Visit: Payer: Self-pay | Admitting: Internal Medicine

## 2022-01-05 DIAGNOSIS — I4819 Other persistent atrial fibrillation: Secondary | ICD-10-CM

## 2022-01-05 NOTE — Telephone Encounter (Signed)
Eliquis '5mg'$  refill request received. Patient is 76 years old, weight-86.7kg, Crea-0.82 on 11/29/2021, Diagnosis-Afib, and last seen by Malka So on 10/10/2021. Dose is appropriate based on dosing criteria. Will send in refill to requested pharmacy.

## 2022-01-09 ENCOUNTER — Ambulatory Visit (HOSPITAL_COMMUNITY): Payer: Medicare Other | Admitting: Physician Assistant

## 2022-01-16 ENCOUNTER — Encounter (HOSPITAL_COMMUNITY): Payer: Self-pay | Admitting: Physician Assistant

## 2022-01-16 ENCOUNTER — Ambulatory Visit (HOSPITAL_COMMUNITY)
Admission: RE | Admit: 2022-01-16 | Discharge: 2022-01-16 | Disposition: A | Discharge status: Home / Self Care | Payer: Medicare Other | Source: Ambulatory Visit | Attending: Physician Assistant | Admitting: Physician Assistant

## 2022-01-16 VITALS — BP 140/88 | HR 58 | Ht 67.0 in | Wt 186.8 lb

## 2022-01-16 DIAGNOSIS — I10 Essential (primary) hypertension: Secondary | ICD-10-CM | POA: Diagnosis not present

## 2022-01-16 DIAGNOSIS — Z8616 Personal history of COVID-19: Secondary | ICD-10-CM | POA: Insufficient documentation

## 2022-01-16 DIAGNOSIS — I340 Nonrheumatic mitral (valve) insufficiency: Secondary | ICD-10-CM | POA: Insufficient documentation

## 2022-01-16 DIAGNOSIS — I4819 Other persistent atrial fibrillation: Secondary | ICD-10-CM | POA: Insufficient documentation

## 2022-01-16 DIAGNOSIS — Z79899 Other long term (current) drug therapy: Secondary | ICD-10-CM | POA: Diagnosis not present

## 2022-01-16 DIAGNOSIS — D6869 Other thrombophilia: Secondary | ICD-10-CM | POA: Diagnosis not present

## 2022-01-16 DIAGNOSIS — R001 Bradycardia, unspecified: Secondary | ICD-10-CM | POA: Diagnosis not present

## 2022-01-16 DIAGNOSIS — Z7901 Long term (current) use of anticoagulants: Secondary | ICD-10-CM | POA: Insufficient documentation

## 2022-01-16 LAB — BASIC METABOLIC PANEL
Anion gap: 8 (ref 5–15)
BUN: 10 mg/dL (ref 8–23)
CO2: 27 mmol/L (ref 22–32)
Calcium: 9.3 mg/dL (ref 8.9–10.3)
Chloride: 105 mmol/L (ref 98–111)
Creatinine, Ser: 0.74 mg/dL (ref 0.44–1.00)
GFR, Estimated: >60 mL/min (ref >60)
Glucose, Bld: 99 mg/dL (ref 70–99)
Potassium: 4.7 mmol/L (ref 3.5–5.1)
Sodium: 140 mmol/L (ref 135–145)

## 2022-01-16 LAB — MAGNESIUM: Magnesium: 2.3 mg/dL (ref 1.7–2.4)

## 2022-01-16 NOTE — Progress Notes (Signed)
Primary Care Physician: Isaac Bliss, Rayford Halsted, MD Primary Cardiologist: Dr Phineas Inches Primary Electrophysiologist: Dr Curt Bears Referring Physician: Dr Phineas Inches   Brandi Spence is a 76 y.o. female with a history of HTN, hypothyroid, HLD, atrial fibrillation who presents for follow up in the [facility]. The patient was initially diagnosed with atrial fibrillation 11/04/20 at a visit with her PCP although she was not told she had afib at that time. She was unaware of her arrhythmia with no symptoms. She was scheduled for total knee replacement on 01/17/21 but this was cancelled due to patient being in afib. Patient is on Eliquis for a CHADS2VASC score of 4. Seen by Dr Harl Bowie on 01/18/21 and started on BB. Patient is s/p DCCV on 02/08/21. Unfortunately, she was back in afib at follow up. She purchased a Kardia mobile device on 02/11/21 which showed afib at that time. Patient is s/p DCCV on 03/28/21. Her BB was stopped and her flecainide was decreased due to bradycardia. She did feel better in SR with less dyspnea on exertion.   Patient is s/p dofetilide admission 3/7-3/10/23 with DCCV on 05/12/21.   On follow up today, patient reports that she has done reasonably well since her last visit. She did have COVID a couple weeks ago but has completely recovered. She did have >24 hours of afib when she was acutely ill. No bleeding issues on anticoagulation.   Today, she denies symptoms of palpitations, chest pain, shortness of breath, orthopnea, PND, lower extremity edema, dizziness, presyncope, syncope, snoring, daytime somnolence, bleeding, or neurologic sequela. The patient is tolerating medications without difficulties and is otherwise without complaint today.    Atrial Fibrillation Risk Factors:  she does not have symptoms or diagnosis of sleep apnea. she does not have a history of rheumatic fever. she does not have a history of alcohol use. The patient does not  have a history of early familial atrial fibrillation or other arrhythmias.  she has a BMI of Body mass index is 29.26 kg/m.Marland Kitchen Filed Weights   01/16/22 1056  Weight: 84.7 kg    Family History  Problem Relation Age of Onset   Colon cancer Father 63   Colon polyps Father    Esophageal cancer Neg Hx    Rectal cancer Neg Hx    Stomach cancer Neg Hx      Atrial Fibrillation Management history:  Previous antiarrhythmic drugs: flecainide, dofetilide   Previous cardioversions: 02/08/21, 03/28/21, 05/12/21 Previous ablations: none CHADS2VASC score: 4 Anticoagulation history: Eliquis   Past Medical History:  Diagnosis Date   Arthritis    Dysrhythmia    A.fib   Family history of adverse reaction to anesthesia    Mother and sister have PONV   History of kidney stones    Hyperlipidemia    Hypertension    Hypothyroidism    Neuropathy    feet   Pre-diabetes    Primary localized osteoarthritis of right knee 01/05/2021   Past Surgical History:  Procedure Laterality Date   ABDOMINAL HYSTERECTOMY  2005   BREAST ENHANCEMENT SURGERY Bilateral 2000   BUNIONECTOMY Right 2013   CARDIOVERSION N/A 02/08/2021   Procedure: CARDIOVERSION;  Surgeon: Donato Heinz, MD;  Location: [facility];  Service: Cardiovascular;  Laterality: N/A;   CARDIOVERSION N/A 03/28/2021   Procedure: CARDIOVERSION;  Surgeon: Jerline Pain, MD;  Location: [facility];  Service: Cardiovascular;  Laterality: N/A;   CARDIOVERSION N/A 05/12/2021   Procedure: CARDIOVERSION;  Surgeon: Sanda Klein,  MD;  Location: [facility];  Service: Cardiovascular;  Laterality: N/A;   CARPAL TUNNEL RELEASE Right 1997   COLONOSCOPY  01/01/2013   EXCISION MORTON'S NEUROMA Bilateral 1980   right foot, 2004   KNEE ARTHROSCOPY Right 2006   2012   PAROTID GLAND TUMOR EXCISION  2009   POLYPECTOMY     SPINAL CORD STIMULATOR IMPLANT Left 2012   TARSAL TUNNEL RELEASE Right 2005   TONSILLECTOMY     TOTAL KNEE ARTHROPLASTY Right  09/12/2021   Procedure: TOTAL KNEE ARTHROPLASTY;  Surgeon: Willaim Sheng, MD;  Location: [facility];  Service: Orthopedics;  Laterality: Right;    Current Outpatient Medications  Medication Sig Dispense Refill   cetirizine (ZYRTEC) 10 MG tablet Take 10 mg by mouth at bedtime.     diltiazem (CARDIZEM) 30 MG tablet TAKE 1 TABLET EVERY 4 HOURS AS NEEDED FOR HEART RATE >100 30 tablet 1   docusate sodium (COLACE) 100 MG capsule Take 100 mg by mouth at bedtime.     dofetilide (TIKOSYN) 250 MCG capsule Take 1 capsule (250 mcg total) by mouth 2 (two) times daily. 180 capsule 3   ELIQUIS 5 MG TABS tablet Take 1 tablet (5 mg total) by mouth 2 (two) times daily. 180 tablet 1   FIBER PO Take 1 tablet by mouth at bedtime.     furosemide (LASIX) 20 MG tablet TAKE 1 TABLET (20 MG TOTAL) BY MOUTH DAILY AS NEEDED FOR EDEMA. 30 tablet 4   levothyroxine (SYNTHROID, LEVOTHROID) 75 MCG tablet Take 75 mcg by mouth daily before breakfast.     MELATONIN PO Take 1 tablet by mouth at bedtime.     metoprolol succinate (TOPROL-XL) 25 MG 24 hr tablet Take 12.5 mg by mouth daily.     OVER THE COUNTER MEDICATION Take 2 tablets by mouth daily. BondiBoost Hair Growth Supplement     oxyCODONE (OXY IR/ROXICODONE) 5 MG immediate release tablet Take 5 mg by mouth every 8 (eight) hours as needed.     potassium gluconate 595 (99 K) MG TABS tablet Take 595 mg by mouth daily.     pregabalin (LYRICA) 200 MG capsule Take 200 mg by mouth in the morning and at bedtime.     simvastatin (ZOCOR) 20 MG tablet Take 1 tablet (20 mg total) by mouth at bedtime. 90 tablet 1   No current facility-administered medications for this encounter.    Allergies  Allergen Reactions   Prednisone Other (See Comments)    Could not sleep and agitated    Social History   Socioeconomic History   Marital status: Divorced    Spouse name: Not on file   Number of children: Not on file   Years of education: Not on file   Highest education level: Not  on file  Occupational History   Not on file  Tobacco Use   Smoking status: Former    Packs/day: 1.00    Years: 5.00    Total pack years: 5.00    Types: Cigarettes    Quit date: 06/26/1980    Years since quitting: 41.5   Smokeless tobacco: Never   Tobacco comments:    Former smoker 03/02/21  Vaping Use   Vaping Use: Never used  Substance and Sexual Activity   Alcohol use: Yes    Alcohol/week: 1.0 standard drink of alcohol    Types: 1 Standard drinks or equivalent per week    Comment: once a month 03/02/21   Drug use: No   Sexual activity:  Not on file  Other Topics Concern   Not on file  Social History Narrative   Not on file   Social Determinants of Health   Financial Resource Strain: Low Risk  (10/27/2021)   Overall Financial Resource Strain (CARDIA)    Difficulty of Paying Living Expenses: Not hard at all  Food Insecurity: No Food Insecurity (10/27/2021)   Hunger Vital Sign    Worried About Running Out of Food in the Last Year: Never true    Ran Out of Food in the Last Year: Never true  Transportation Needs: No Transportation Needs (10/27/2021)   PRAPARE - Hydrologist (Medical): No    Lack of Transportation (Non-Medical): No  Physical Activity: Not on file  Stress: No Stress Concern Present (10/27/2021)   [place]    Feeling of Stress : Not at all  Social Connections: Not on file  Intimate Partner Violence: Not on file     ROS- All systems are reviewed and negative except as per the HPI above.  Physical Exam: Vitals:   01/16/22 1056  Weight: 84.7 kg    GEN- The patient is a well appearing elderly female, alert and oriented x 3 today.   HEENT-head normocephalic, atraumatic, sclera clear, conjunctiva pink, hearing intact, trachea midline. Lungs- Clear to ausculation bilaterally, normal work of breathing Heart- Regular rate and rhythm, no murmurs, rubs or gallops   GI- soft, NT, ND, + BS Extremities- no clubbing, cyanosis, or edema MS- no significant deformity or atrophy Skin- no rash or lesion Psych- euthymic mood, full affect Neuro- strength and sensation are intact   Wt Readings from Last 3 Encounters:  01/16/22 84.7 kg  11/29/21 86.7 kg  10/10/21 87.8 kg    EKG today demonstrates  SB Vent. rate 58 BPM PR interval 184 ms QRS duration 70 ms QT/QTcB 442/433 ms  Echo 02/01/21 demonstrated  1. Left ventricular ejection fraction, by estimation, is 55 to 60%. The  left ventricle has normal function. The left ventricle has no regional  wall motion abnormalities. Left ventricular diastolic function could not  be evaluated.   2. Right ventricular systolic function is low normal. The right  ventricular size is normal. There is moderately elevated pulmonary artery systolic pressure. The estimated right ventricular systolic pressure is 19.4 mmHg.   3. Left atrial size was moderately dilated.   4. The mitral valve is grossly normal. Mild to moderate mitral valve  regurgitation.   5. The aortic valve is tricuspid. Aortic valve regurgitation is not  visualized.   6. The inferior vena cava is dilated in size with <50% respiratory  variability, suggesting right atrial pressure of 15 mmHg.   Comparison(s): No prior Echocardiogram.   Epic records are reviewed at length today  CHA2DS2-VASc Score = 4  The patient's score is based upon: CHF History: 0 HTN History: 1 Diabetes History: 0 Stroke History: 0 Vascular Disease History: 0 Age Score: 2 Gender Score: 1       ASSESSMENT AND PLAN: 1. Persistent Atrial Fibrillation (ICD10:  I48.19) The patient's CHA2DS2-VASc score is 4, indicating a 4.8% annual risk of stroke.   S/p dofetilide admission 3/7-3/10/23  Patient has had ~10 episodes of afib since starting dofetilide. She states most are 1-2 hours but a couple have been several hours. We discussed ablation and decided that her symptoms  don't warrant ablation yet.  Continue dofetilide 250 mcg BID. QT stable. Check bmet/mag today.  Continue Eliquis 5 mg BID Continue metoprolol 12.5 mg daily Change diltiazem 30 mg to PRN q 4 hours for heart racing.  Kardia for home monitoring.   2. Secondary Hypercoagulable State (ICD10:  D68.69) The patient is at significant risk for stroke/thromboembolism based upon her CHA2DS2-VASc Score of 4.  Continue Apixaban (Eliquis).   3. HTN Stable, no changes today.  4. 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    [facility] [address] [place], [place] [address] [phone] 01/16/2022 11:05 AM

## 2022-01-18 ENCOUNTER — Other Ambulatory Visit (HOSPITAL_COMMUNITY): Payer: Self-pay

## 2022-02-02 DIAGNOSIS — M79642 Pain in left hand: Secondary | ICD-10-CM | POA: Diagnosis not present

## 2022-02-02 DIAGNOSIS — M65351 Trigger finger, right little finger: Secondary | ICD-10-CM | POA: Diagnosis not present

## 2022-02-02 DIAGNOSIS — G5602 Carpal tunnel syndrome, left upper limb: Secondary | ICD-10-CM | POA: Diagnosis not present

## 2022-02-02 DIAGNOSIS — M79644 Pain in right finger(s): Secondary | ICD-10-CM | POA: Diagnosis not present

## 2022-02-02 DIAGNOSIS — M79645 Pain in left finger(s): Secondary | ICD-10-CM | POA: Diagnosis not present

## 2022-02-02 DIAGNOSIS — M1812 Unilateral primary osteoarthritis of first carpometacarpal joint, left hand: Secondary | ICD-10-CM | POA: Diagnosis not present

## 2022-02-08 DIAGNOSIS — G894 Chronic pain syndrome: Secondary | ICD-10-CM | POA: Diagnosis not present

## 2022-02-08 DIAGNOSIS — G603 Idiopathic progressive neuropathy: Secondary | ICD-10-CM | POA: Diagnosis not present

## 2022-02-08 DIAGNOSIS — M7742 Metatarsalgia, left foot: Secondary | ICD-10-CM | POA: Diagnosis not present

## 2022-02-08 DIAGNOSIS — M7741 Metatarsalgia, right foot: Secondary | ICD-10-CM | POA: Diagnosis not present

## 2022-02-16 ENCOUNTER — Encounter: Payer: Self-pay | Admitting: Internal Medicine

## 2022-02-16 ENCOUNTER — Ambulatory Visit: Payer: Medicare Other | Attending: Internal Medicine | Admitting: Internal Medicine

## 2022-02-16 VITALS — BP 149/80 | HR 68 | Ht 66.0 in | Wt 191.8 lb

## 2022-02-16 DIAGNOSIS — I4891 Unspecified atrial fibrillation: Secondary | ICD-10-CM | POA: Diagnosis not present

## 2022-02-16 NOTE — Progress Notes (Signed)
Cardiology Office Note:    Date:  02/16/2022   ID:  Brandi Spence, Brandi Spence May 08, 1945, MRN [id number]  PCP:  Isaac Bliss, Rayford Halsted, MD   [facility] Providers Cardiologist:  Janina Mayo, MD Electrophysiologist:  Constance Haw, MD     Referring MD: Isaac Bliss, [place]*   No chief complaint on file.  Atrial Fibrillation  History of Present Illness:    Brandi Spence is a 76 y.o. female with a hx of arthritis, hypothyroid, HTN planned for R TKA, found to be in atrial fibrillation rate controlled in the 70s  This is a new diagnosis for her. She went for a pre surgical check up in September. She had atrial fibrillation. She has no cardiac hx , no stress test no LHC.  Her parents had strokes later in life. Her father had a stroke in his 48s. She has siblings. Her sister had breast cancer. No stroke history. Her blood pressure is well controlled. She is retired now. Prior to that she was helping with her mother. She is consistently helping her family. She stays active in the community.   She denies exertional angina, dyspnea on exertion, LH, dizziness, syncopal episode. No orthopena , PND , no LE edema. Low concern for sleep apnea.   TSH 0.7 on levothyroxine. Has a thyroid nodule, stable. Crt 0.78   Interim Hx 02/16/2022  She feels well today.  EKG 01/16/2022 sinus bradycardia 58 bpm Qtc 433 ms. No SOB.   Cardiology Studies ECGs 11/04/2020: Atrial fibrillation rate controlled, low voltage 02/14/2016:sinus bradycardia 59 , low voltage  02/01/2021- EF 55-60%,  RV is nl,  LA vol index ~ 42 cc/m2, mild-moderate MR, RVSP 47.3 mmHg   Past Medical History:  Diagnosis Date   Arthritis    Dysrhythmia    A.fib   Family history of adverse reaction to anesthesia    Mother and sister have PONV   History of kidney stones    Hyperlipidemia    Hypertension    Hypothyroidism    Neuropathy    feet   Pre-diabetes    Primary localized osteoarthritis of right knee  01/05/2021    Past Surgical History:  Procedure Laterality Date   ABDOMINAL HYSTERECTOMY  2005   BREAST ENHANCEMENT SURGERY Bilateral 2000   BUNIONECTOMY Right 2013   CARDIOVERSION N/A 02/08/2021   Procedure: CARDIOVERSION;  Surgeon: Donato Heinz, MD;  Location: [facility];  Service: Cardiovascular;  Laterality: N/A;   CARDIOVERSION N/A 03/28/2021   Procedure: CARDIOVERSION;  Surgeon: Jerline Pain, MD;  Location: [facility];  Service: Cardiovascular;  Laterality: N/A;   CARDIOVERSION N/A 05/12/2021   Procedure: CARDIOVERSION;  Surgeon: Sanda Klein, MD;  Location: [facility];  Service: Cardiovascular;  Laterality: N/A;   CARPAL TUNNEL RELEASE Right 1997   COLONOSCOPY  01/01/2013   EXCISION MORTON'S NEUROMA Bilateral 1980   right foot, 2004   KNEE ARTHROSCOPY Right 2006   2012   PAROTID GLAND TUMOR EXCISION  2009   POLYPECTOMY     SPINAL CORD STIMULATOR IMPLANT Left 2012   TARSAL TUNNEL RELEASE Right 2005   TONSILLECTOMY     TOTAL KNEE ARTHROPLASTY Right 09/12/2021   Procedure: TOTAL KNEE ARTHROPLASTY;  Surgeon: Willaim Sheng, MD;  Location: [facility];  Service: Orthopedics;  Laterality: Right;    Current Medications: No outpatient medications have been marked as taking for the 02/16/22 encounter (Appointment) with Janina Mayo, MD.     Allergies:   Prednisone   Social History  Socioeconomic History   Marital status: Divorced    Spouse name: Not on file   Number of children: Not on file   Years of education: Not on file   Highest education level: Not on file  Occupational History   Not on file  Tobacco Use   Smoking status: Former    Packs/day: 1.00    Years: 5.00    Total pack years: 5.00    Types: Cigarettes    Quit date: 06/26/1980    Years since quitting: 41.6   Smokeless tobacco: Never   Tobacco comments:    Former smoker 03/02/21  Vaping Use   Vaping Use: Never used  Substance and Sexual Activity   Alcohol use: Yes     Alcohol/week: 1.0 standard drink of alcohol    Types: 1 Standard drinks or equivalent per week    Comment: once a month 03/02/21   Drug use: No   Sexual activity: Not on file  Other Topics Concern   Not on file  Social History Narrative   Not on file   Social Determinants of Health   Financial Resource Strain: Low Risk  (10/27/2021)   Overall Financial Resource Strain (CARDIA)    Difficulty of Paying Living Expenses: Not hard at all  Food Insecurity: No Food Insecurity (10/27/2021)   Hunger Vital Sign    Worried About Running Out of Food in the Last Year: Never true    Ran Out of Food in the Last Year: Never true  Transportation Needs: No Transportation Needs (10/27/2021)   PRAPARE - Hydrologist (Medical): No    Lack of Transportation (Non-Medical): No  Physical Activity: Not on file  Stress: No Stress Concern Present (10/27/2021)   [place]    Feeling of Stress : Not at all  Social Connections: Not on file     Family History: The patient's family history includes Colon cancer (age of onset: 46) in her father; Colon polyps in her father. There is no history of Esophageal cancer, Rectal cancer, or Stomach cancer.  ROS:   Please see the history of present illness.     All other systems reviewed and are negative.  EKGs/Labs/Other Studies Reviewed:    The following studies were reviewed today:   EKG:  EKG is  ordered today.  The ekg ordered today demonstrates   01/18/2021 EKG-Atrial Fibrillation rate in the 70s; QTc 403 ms  Recent Labs: 11/29/2021: ALT 27; Hemoglobin 13.6; Platelets 227.0 01/16/2022: BUN 10; Creatinine, Ser 0.74; Magnesium 2.3; Potassium 4.7; Sodium 140   Recent Lipid Panel    Component Value Date/Time   CHOL 173 11/29/2021 1440   TRIG 179.0 (H) 11/29/2021 1440   HDL 52.00 11/29/2021 1440   CHOLHDL 3 11/29/2021 1440   VLDL 35.8 11/29/2021 1440   LDLCALC 85  11/29/2021 1440   LDLDIRECT 112.0 06/26/2019 1058     Risk Assessment/Calculations:    CHA2DS2-VASc Score =     This indicates a  % annual risk of stroke. The patient's score is based upon:           Physical Exam:    VS:   Vitals:   02/16/22 1512  BP: (!) 149/80  Pulse: 68  SpO2: 98%    Wt Readings from Last 3 Encounters:  01/16/22 186 lb 12.8 oz (84.7 kg)  11/29/21 191 lb 3.2 oz (86.7 kg)  10/10/21 193 lb 9.6 oz (87.8 kg)  GEN:  Well nourished, well developed in no acute distress. Looks younger than stated age 53: Normal NECK: No JVD; No carotid bruits LYMPHATICS: No lymphadenopathy CARDIAC: irregular.irregular, no murmurs, rubs, gallops RESPIRATORY:  Clear to auscultation without rales, wheezing or rhonchi  ABDOMEN: Soft, non-tender, non-distended MUSCULOSKELETAL:  No edema; No deformity  SKIN: Warm and dry NEUROLOGIC:  Alert and oriented x 3 PSYCHIATRIC:  Normal affect   ASSESSMENT:    #New Onset Atrial Fibrillation: Chads2vasc 4. TSH was nl on synthroid . Normal prior liver fxn. As far as we know she's been in it for two months. She is rate controlled. No hx of ischemic heart disease. No signs of CHF. S/p DCCV 02/08/21. Had recurrent afib 12/9 noted on Kardia mobile. DCCV 03/28/21. Her BB was stopped and her flecainide was decreased with bradycardia. She was admitted for initiation of dofetilide 05/10/2021-05/13/2021.  - continue dofetilide 250 mcg daily BID - continue eliquis 5 mg  - continue metoprolol 12.5 mg XL daily  - continue dilt 30 mg Q4H PRN - if failed AAD, plan is to consider PVI  #Mild-Moderate MR: asymptomatic. Resolution challenging, valve appears degenerative. Will continue surveillance  #HTN- BB  #HLD- on simvastatin 20 mg daily   PLAN:    In order of problems listed above:   Follow up 12 months         Medication Adjustments/Labs and Tests Ordered: Current medicines are reviewed at length with the patient today.  Concerns  regarding medicines are outlined above.    Signed, Janina Mayo, MD  02/16/2022 1:08 PM    [facility]

## 2022-02-16 NOTE — Patient Instructions (Signed)
Medication Instructions:  Your physician recommends that you continue on your current medications as directed. Please refer to the Current Medication list given to you today.  *If you need a refill on your cardiac medications before your next appointment, please call your pharmacy*   Lab Work: NONE If you have labs (blood work) drawn today and your tests are completely normal, you will receive your results only by: [place] (if you have MyChart) OR A paper copy in the mail If you have any lab test that is abnormal or we need to change your treatment, we will call you to review the results.   Testing/Procedures: NONE   Follow-Up: At [facility], you and your health needs are our priority.  As part of our continuing mission to provide you with exceptional heart care, we have created designated Provider Care Teams.  These Care Teams include your primary Cardiologist (physician) and Advanced Practice Providers (APPs -  Physician Assistants and Nurse Practitioners) who all work together to provide you with the care you need, when you need it.  We recommend signing up for the patient portal called "MyChart".  Sign up information is provided on this After Visit Summary.  MyChart is used to connect with patients for Virtual Visits (Telemedicine).  Patients are able to view lab/test results, encounter notes, upcoming appointments, etc.  Non-urgent messages can be sent to your provider as well.   To learn more about what you can do with MyChart, go to [email or web address].    Your next appointment:   1 year(s)  The format for your next appointment:   In Person  Provider:   Janina Mayo, MD

## 2022-03-14 DIAGNOSIS — M25561 Pain in right knee: Secondary | ICD-10-CM | POA: Diagnosis not present

## 2022-03-21 DIAGNOSIS — M79671 Pain in right foot: Secondary | ICD-10-CM | POA: Diagnosis not present

## 2022-03-21 DIAGNOSIS — M79672 Pain in left foot: Secondary | ICD-10-CM | POA: Diagnosis not present

## 2022-04-05 DIAGNOSIS — M542 Cervicalgia: Secondary | ICD-10-CM | POA: Diagnosis not present

## 2022-04-05 DIAGNOSIS — M25512 Pain in left shoulder: Secondary | ICD-10-CM | POA: Diagnosis not present

## 2022-04-10 ENCOUNTER — Other Ambulatory Visit (HOSPITAL_COMMUNITY): Payer: Self-pay | Admitting: *Deleted

## 2022-04-10 MED ORDER — DOFETILIDE 250 MCG PO CAPS: 250.0000 ug | ORAL_CAPSULE | Freq: Two times a day (BID) | ORAL | 2 refills | Status: DC

## 2022-04-11 ENCOUNTER — Other Ambulatory Visit: Payer: Self-pay | Admitting: Internal Medicine

## 2022-04-27 DIAGNOSIS — M7742 Metatarsalgia, left foot: Secondary | ICD-10-CM | POA: Diagnosis not present

## 2022-04-27 DIAGNOSIS — G894 Chronic pain syndrome: Secondary | ICD-10-CM | POA: Diagnosis not present

## 2022-04-27 DIAGNOSIS — G603 Idiopathic progressive neuropathy: Secondary | ICD-10-CM | POA: Diagnosis not present

## 2022-04-27 DIAGNOSIS — M7741 Metatarsalgia, right foot: Secondary | ICD-10-CM | POA: Diagnosis not present

## 2022-05-17 ENCOUNTER — Ambulatory Visit (HOSPITAL_COMMUNITY)
Admission: RE | Admit: 2022-05-17 | Discharge: 2022-05-17 | Disposition: A | Discharge status: Home / Self Care | Payer: Medicare Other | Source: Ambulatory Visit | Attending: Physician Assistant | Admitting: Physician Assistant

## 2022-05-17 VITALS — BP 154/78 | HR 62 | Ht 66.0 in | Wt 200.2 lb

## 2022-05-17 DIAGNOSIS — D6869 Other thrombophilia: Secondary | ICD-10-CM | POA: Insufficient documentation

## 2022-05-17 DIAGNOSIS — I4819 Other persistent atrial fibrillation: Secondary | ICD-10-CM | POA: Diagnosis not present

## 2022-05-17 DIAGNOSIS — Z7901 Long term (current) use of anticoagulants: Secondary | ICD-10-CM | POA: Insufficient documentation

## 2022-05-17 DIAGNOSIS — I38 Endocarditis, valve unspecified: Secondary | ICD-10-CM | POA: Diagnosis not present

## 2022-05-17 DIAGNOSIS — I10 Essential (primary) hypertension: Secondary | ICD-10-CM | POA: Insufficient documentation

## 2022-05-17 LAB — BASIC METABOLIC PANEL
Anion gap: 5 (ref 5–15)
BUN: 10 mg/dL (ref 8–23)
CO2: 30 mmol/L (ref 22–32)
Calcium: 9.4 mg/dL (ref 8.9–10.3)
Chloride: 106 mmol/L (ref 98–111)
Creatinine, Ser: 0.74 mg/dL (ref 0.44–1.00)
GFR, Estimated: >60 mL/min (ref >60)
Glucose, Bld: 104 mg/dL — ABNORMAL HIGH (ref 70–99)
Potassium: 4.7 mmol/L (ref 3.5–5.1)
Sodium: 141 mmol/L (ref 135–145)

## 2022-05-17 LAB — MAGNESIUM: Magnesium: 2.1 mg/dL (ref 1.7–2.4)

## 2022-05-17 MED ORDER — METOPROLOL SUCCINATE ER 25 MG PO TB24: 12.5000 mg | ORAL_TABLET | Freq: Every day | ORAL | Status: DC

## 2022-05-17 MED ORDER — LISINOPRIL 10 MG PO TABS: 10.0000 mg | ORAL_TABLET | Freq: Every day | ORAL | 3 refills | Status: DC

## 2022-05-17 NOTE — Progress Notes (Signed)
Primary Care Physician: Isaac Bliss, Rayford Halsted, MD Primary Cardiologist: Dr Phineas Inches Primary Electrophysiologist: Dr Curt Bears Referring Physician: Dr Phineas Inches   Brandi Spence is a 77 y.o. female with a history of HTN, hypothyroid, HLD, atrial fibrillation who presents for follow up in the [facility]. The patient was initially diagnosed with atrial fibrillation 11/04/20 at a visit with her PCP although she was not told she had afib at that time. She was unaware of her arrhythmia with no symptoms. She was scheduled for total knee replacement on 01/17/21 but this was cancelled due to patient being in afib. Patient is on Eliquis for a CHADS2VASC score of 4. Seen by Dr Harl Bowie on 01/18/21 and started on BB. Patient is s/p DCCV on 02/08/21. Unfortunately, she was back in afib at follow up. She purchased a Kardia mobile device on 02/11/21 which showed afib at that time. Patient is s/p DCCV on 03/28/21. Her BB was stopped and her flecainide was decreased due to bradycardia. She did feel better in SR with less dyspnea on exertion.   Patient is s/p dofetilide admission 3/7-3/10/23 with DCCV on 05/12/21.   On follow up today, patient reports that she has done well since her last visit. She has only had one brief episode of afib in the interim. No bleeding issues on anticoagulation. She has noted that her BP has been running between [id number] systolic consistently at home.   Today, she denies symptoms of palpitations, chest pain, shortness of breath, orthopnea, PND, lower extremity edema, dizziness, presyncope, syncope, snoring, daytime somnolence, bleeding, or neurologic sequela. The patient is tolerating medications without difficulties and is otherwise without complaint today.    Atrial Fibrillation Risk Factors:  she does not have symptoms or diagnosis of sleep apnea. she does not have a history of rheumatic fever. she does not have a history of alcohol use. The  patient does not have a history of early familial atrial fibrillation or other arrhythmias.  she has a BMI of Body mass index is 32.31 kg/m.Marland Kitchen Filed Weights   05/17/22 1128  Weight: 90.8 kg    Family History  Problem Relation Age of Onset   Colon cancer Father 61   Colon polyps Father    Esophageal cancer Neg Hx    Rectal cancer Neg Hx    Stomach cancer Neg Hx     Atrial Fibrillation Management history:  Previous antiarrhythmic drugs: flecainide, dofetilide   Previous cardioversions: 02/08/21, 03/28/21, 05/12/21 Previous ablations: none CHADS2VASC score: 4 Anticoagulation history: Eliquis   Past Medical History:  Diagnosis Date   Arthritis    Dysrhythmia    A.fib   Family history of adverse reaction to anesthesia    Mother and sister have PONV   History of kidney stones    Hyperlipidemia    Hypertension    Hypothyroidism    Neuropathy    feet   Pre-diabetes    Primary localized osteoarthritis of right knee 01/05/2021   Past Surgical History:  Procedure Laterality Date   ABDOMINAL HYSTERECTOMY  2005   BREAST ENHANCEMENT SURGERY Bilateral 2000   BUNIONECTOMY Right 2013   CARDIOVERSION N/A 02/08/2021   Procedure: CARDIOVERSION;  Surgeon: Donato Heinz, MD;  Location: [place];  Service: Cardiovascular;  Laterality: N/A;   CARDIOVERSION N/A 03/28/2021   Procedure: CARDIOVERSION;  Surgeon: Jerline Pain, MD;  Location: [place];  Service: Cardiovascular;  Laterality: N/A;   CARDIOVERSION N/A 05/12/2021   Procedure: CARDIOVERSION;  Surgeon: Croitoru,  Dani Gobble, MD;  Location: [place];  Service: Cardiovascular;  Laterality: N/A;   CARPAL TUNNEL RELEASE Right 1997   COLONOSCOPY  01/01/2013   EXCISION MORTON'S NEUROMA Bilateral 1980   right foot, 2004   KNEE ARTHROSCOPY Right 2006   2012   PAROTID GLAND TUMOR EXCISION  2009   POLYPECTOMY     SPINAL CORD STIMULATOR IMPLANT Left 2012   TARSAL TUNNEL RELEASE Right 2005   TONSILLECTOMY     TOTAL KNEE  ARTHROPLASTY Right 09/12/2021   Procedure: TOTAL KNEE ARTHROPLASTY;  Surgeon: Willaim Sheng, MD;  Location: [facility];  Service: Orthopedics;  Laterality: Right;    Current Outpatient Medications  Medication Sig Dispense Refill   cetirizine (ZYRTEC) 10 MG tablet Take 10 mg by mouth at bedtime.     diltiazem (CARDIZEM) 30 MG tablet TAKE 1 TABLET EVERY 4 HOURS AS NEEDED FOR HEART RATE >100 30 tablet 1   docusate sodium (COLACE) 100 MG capsule Take 100 mg by mouth at bedtime.     dofetilide (TIKOSYN) 250 MCG capsule Take 1 capsule (250 mcg total) by mouth 2 (two) times daily. 180 capsule 2   ELIQUIS 5 MG TABS tablet Take 1 tablet (5 mg total) by mouth 2 (two) times daily. 180 tablet 1   FIBER PO Take 2 tablets by mouth at bedtime.     furosemide (LASIX) 20 MG tablet TAKE 1 TABLET (20 MG TOTAL) BY MOUTH DAILY AS NEEDED FOR EDEMA. 30 tablet 4   levothyroxine (SYNTHROID, LEVOTHROID) 75 MCG tablet Take 75 mcg by mouth daily before breakfast.     MELATONIN PO Take 1 tablet by mouth at bedtime.     OVER THE COUNTER MEDICATION Take 2 tablets by mouth daily. BondiBoost Hair Growth Supplement     PERCOCET 5-325 MG tablet Take 1 tablet by mouth every 6 (six) hours as needed.     potassium gluconate 595 (99 K) MG TABS tablet Take 595 mg by mouth daily.     pregabalin (LYRICA) 200 MG capsule Take 200 mg by mouth in the morning and at bedtime.     simvastatin (ZOCOR) 20 MG tablet Take 1 tablet (20 mg total) by mouth at bedtime. 90 tablet 1   metoprolol succinate (TOPROL-XL) 25 MG 24 hr tablet Take 0.5 tablets (12.5 mg total) by mouth daily. TAKE WITH OR IMMEDIATELY FOLLOWING A MEAL.     No current facility-administered medications for this encounter.    Allergies  Allergen Reactions   Prednisone Other (See Comments)    Could not sleep and agitated    Social History   Socioeconomic History   Marital status: Divorced    Spouse name: Not on file   Number of children: Not on file   Years of  education: Not on file   Highest education level: Not on file  Occupational History   Not on file  Tobacco Use   Smoking status: Former    Packs/day: 1.00    Years: 5.00    Total pack years: 5.00    Types: Cigarettes    Quit date: 06/26/1980    Years since quitting: 41.9   Smokeless tobacco: Never   Tobacco comments:    Former smoker 03/02/21  Vaping Use   Vaping Use: Never used  Substance and Sexual Activity   Alcohol use: Yes    Alcohol/week: 1.0 standard drink of alcohol    Types: 1 Standard drinks or equivalent per week    Comment: once a month 03/02/21  Drug use: No   Sexual activity: Not on file  Other Topics Concern   Not on file  Social History Narrative   Not on file   Social Determinants of Health   Financial Resource Strain: Low Risk  (10/27/2021)   Overall Financial Resource Strain (CARDIA)    Difficulty of Paying Living Expenses: Not hard at all  Food Insecurity: No Food Insecurity (10/27/2021)   Hunger Vital Sign    Worried About Running Out of Food in the Last Year: Never true    Ran Out of Food in the Last Year: Never true  Transportation Needs: No Transportation Needs (10/27/2021)   PRAPARE - Hydrologist (Medical): No    Lack of Transportation (Non-Medical): No  Physical Activity: Not on file  Stress: No Stress Concern Present (10/27/2021)   [place]    Feeling of Stress : Not at all  Social Connections: Not on file  Intimate Partner Violence: Not on file     ROS- All systems are reviewed and negative except as per the HPI above.  Physical Exam: Vitals:   05/17/22 1128  BP: (!) 154/78  Pulse: 62  Weight: 90.8 kg  Height: '5\' 6"'$  (1.676 m)    GEN- The patient is a well appearing elderly female, alert and oriented x 3 today.   HEENT-head normocephalic, atraumatic, sclera clear, conjunctiva pink, hearing intact, trachea midline. Lungs- Clear to  ausculation bilaterally, normal work of breathing Heart- Regular rate and rhythm, no murmurs, rubs or gallops  GI- soft, NT, ND, + BS Extremities- no clubbing, cyanosis, or edema MS- no significant deformity or atrophy Skin- no rash or lesion Psych- euthymic mood, full affect Neuro- strength and sensation are intact   Wt Readings from Last 3 Encounters:  05/17/22 90.8 kg  02/16/22 87 kg  01/16/22 84.7 kg    EKG today demonstrates  SR Vent. rate 62 BPM PR interval 184 ms QRS duration 72 ms QT/QTcB 458/464 ms  Echo 02/01/21 demonstrated  1. Left ventricular ejection fraction, by estimation, is 55 to 60%. The  left ventricle has normal function. The left ventricle has no regional  wall motion abnormalities. Left ventricular diastolic function could not  be evaluated.   2. Right ventricular systolic function is low normal. The right  ventricular size is normal. There is moderately elevated pulmonary artery systolic pressure. The estimated right ventricular systolic pressure is 123456 mmHg.   3. Left atrial size was moderately dilated.   4. The mitral valve is grossly normal. Mild to moderate mitral valve  regurgitation.   5. The aortic valve is tricuspid. Aortic valve regurgitation is not  visualized.   6. The inferior vena cava is dilated in size with <50% respiratory  variability, suggesting right atrial pressure of 15 mmHg.   Comparison(s): No prior Echocardiogram.   Epic records are reviewed at length today  CHA2DS2-VASc Score = 4  The patient's score is based upon: CHF History: 0 HTN History: 1 Diabetes History: 0 Stroke History: 0 Vascular Disease History: 0 Age Score: 2 Gender Score: 1        ASSESSMENT AND PLAN: 1. Persistent Atrial Fibrillation (ICD10:  I48.19) The patient's CHA2DS2-VASc score is 4, indicating a 4.8% annual risk of stroke.   S/p dofetilide admission 3/7-3/10/23  Patient appears to be maintaining SR.  Continue dofetilide 250 mcg BID. QT  stable. Check bmet/mag today.  Continue Eliquis 5 mg BID Continue metoprolol 12.5  mg daily Change diltiazem 30 mg to PRN q 4 hours for heart racing.  Kardia for home monitoring.   2. Secondary Hypercoagulable State (ICD10:  D68.69) The patient is at significant risk for stroke/thromboembolism based upon her CHA2DS2-VASc Score of 4.  Continue Apixaban (Eliquis).   3. HTN Has been elevated [id number] systolic at home and here today.  She was previously on lisinopril-HCTZ prior to her afib diagnosis.  Will resume lisinopril 10 mg daily. Check bmet in 2-3 weeks.   4. 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    Follow up in the AF clinic in 6 months.    [facility] [address] [place], [place] [address] [phone] 05/17/2022 12:04 PM

## 2022-05-17 NOTE — Patient Instructions (Signed)
Start lisinopril '10mg'$  once a day

## 2022-05-23 DIAGNOSIS — M7542 Impingement syndrome of left shoulder: Secondary | ICD-10-CM | POA: Diagnosis not present

## 2022-05-23 DIAGNOSIS — M25512 Pain in left shoulder: Secondary | ICD-10-CM | POA: Diagnosis not present

## 2022-05-23 DIAGNOSIS — M25511 Pain in right shoulder: Secondary | ICD-10-CM | POA: Diagnosis not present

## 2022-05-25 DIAGNOSIS — M25512 Pain in left shoulder: Secondary | ICD-10-CM | POA: Diagnosis not present

## 2022-05-25 DIAGNOSIS — M6281 Muscle weakness (generalized): Secondary | ICD-10-CM | POA: Diagnosis not present

## 2022-05-29 ENCOUNTER — Other Ambulatory Visit: Payer: Self-pay | Admitting: Internal Medicine

## 2022-05-29 DIAGNOSIS — M1711 Unilateral primary osteoarthritis, right knee: Secondary | ICD-10-CM

## 2022-05-29 DIAGNOSIS — I10 Essential (primary) hypertension: Secondary | ICD-10-CM

## 2022-05-29 DIAGNOSIS — K219 Gastro-esophageal reflux disease without esophagitis: Secondary | ICD-10-CM

## 2022-05-29 DIAGNOSIS — E559 Vitamin D deficiency, unspecified: Secondary | ICD-10-CM

## 2022-05-29 DIAGNOSIS — E7800 Pure hypercholesterolemia, unspecified: Secondary | ICD-10-CM

## 2022-05-29 DIAGNOSIS — I4819 Other persistent atrial fibrillation: Secondary | ICD-10-CM

## 2022-05-29 DIAGNOSIS — E039 Hypothyroidism, unspecified: Secondary | ICD-10-CM

## 2022-05-30 DIAGNOSIS — M6281 Muscle weakness (generalized): Secondary | ICD-10-CM | POA: Diagnosis not present

## 2022-05-30 DIAGNOSIS — M25512 Pain in left shoulder: Secondary | ICD-10-CM | POA: Diagnosis not present

## 2022-06-07 ENCOUNTER — Ambulatory Visit (HOSPITAL_COMMUNITY)
Admission: RE | Admit: 2022-06-07 | Discharge: 2022-06-07 | Disposition: A | Discharge status: Home / Self Care | Payer: Medicare Other | Source: Ambulatory Visit | Attending: Physician Assistant | Admitting: Physician Assistant

## 2022-06-07 DIAGNOSIS — I4819 Other persistent atrial fibrillation: Secondary | ICD-10-CM

## 2022-06-07 LAB — BASIC METABOLIC PANEL
Anion gap: 7 (ref 5–15)
BUN: 11 mg/dL (ref 8–23)
CO2: 31 mmol/L (ref 22–32)
Calcium: 9.5 mg/dL (ref 8.9–10.3)
Chloride: 102 mmol/L (ref 98–111)
Creatinine, Ser: 0.82 mg/dL (ref 0.44–1.00)
GFR, Estimated: >60 mL/min (ref >60)
Glucose, Bld: 98 mg/dL (ref 70–99)
Potassium: 4.9 mmol/L (ref 3.5–5.1)
Sodium: 140 mmol/L (ref 135–145)

## 2022-06-08 DIAGNOSIS — M25512 Pain in left shoulder: Secondary | ICD-10-CM | POA: Diagnosis not present

## 2022-06-08 DIAGNOSIS — M6281 Muscle weakness (generalized): Secondary | ICD-10-CM | POA: Diagnosis not present

## 2022-06-13 DIAGNOSIS — M25512 Pain in left shoulder: Secondary | ICD-10-CM | POA: Diagnosis not present

## 2022-06-15 ENCOUNTER — Other Ambulatory Visit: Payer: Self-pay | Admitting: Internal Medicine

## 2022-06-15 DIAGNOSIS — I4819 Other persistent atrial fibrillation: Secondary | ICD-10-CM

## 2022-06-16 NOTE — Telephone Encounter (Signed)
Prescription refill request for Eliquis received. Indication: Afib  Last office visit: 05/17/22 Charlean Merl)  Scr: 0.82 (06/07/22)  Age: 77 Weight: 90.8kg  Appropriate dose. Refill sent.

## 2022-06-22 ENCOUNTER — Telehealth: Payer: Self-pay | Admitting: Podiatry

## 2022-06-22 DIAGNOSIS — G894 Chronic pain syndrome: Secondary | ICD-10-CM | POA: Diagnosis not present

## 2022-06-22 DIAGNOSIS — G603 Idiopathic progressive neuropathy: Secondary | ICD-10-CM | POA: Diagnosis not present

## 2022-06-22 DIAGNOSIS — M7742 Metatarsalgia, left foot: Secondary | ICD-10-CM | POA: Diagnosis not present

## 2022-06-22 DIAGNOSIS — M7741 Metatarsalgia, right foot: Secondary | ICD-10-CM | POA: Diagnosis not present

## 2022-06-22 NOTE — Telephone Encounter (Signed)
Pt called and is on eliquis and she is coming in on Monday for a toenail removal probably and she is asking if she should discontinue the medication. She did call the doctor that gave her the medication and they told her to call our office.

## 2022-06-23 NOTE — Telephone Encounter (Signed)
Notified pt and she said thanks.

## 2022-06-23 NOTE — Telephone Encounter (Signed)
She's fine to stay on it

## 2022-06-26 ENCOUNTER — Ambulatory Visit (INDEPENDENT_AMBULATORY_CARE_PROVIDER_SITE_OTHER): Payer: Medicare Other | Admitting: Podiatry

## 2022-06-26 ENCOUNTER — Encounter: Payer: Self-pay | Admitting: Podiatry

## 2022-06-26 DIAGNOSIS — L600 Ingrowing nail: Secondary | ICD-10-CM

## 2022-06-26 NOTE — Patient Instructions (Signed)

## 2022-06-27 NOTE — Progress Notes (Signed)
Subjective:   Patient ID: Brandi Spence, female   DOB: 77 y.o.   MRN: [id number]   HPI Patient states that she has had some degree of trauma to her left hallux nail and it has separated from the nailbed is painful and discolored but does not remember injury but has neuropathy   ROS      Objective:  Physical Exam  Neurovascular status intact with the patient's left hallux nail discolored there is pressure on it but overall it is just detached from the underlying nailbed     Assessment:  Significant trauma to the left hallux nail with fluid buildup pains     Plan:  H&P reviewed I have recommended removal of the nail flushing out the bed and I anesthetized 60 mg like Marcaine mixture I removed the hallux nail I flushed the bed and applied sterile dressing gave instructions on soaking and patient will be seen back as needed and it ultimately may require permanent procedure

## 2022-07-03 DIAGNOSIS — A6920 Lyme disease, unspecified: Secondary | ICD-10-CM | POA: Diagnosis not present

## 2022-07-11 ENCOUNTER — Telehealth: Payer: Self-pay

## 2022-07-11 NOTE — Progress Notes (Signed)
Care Management & Coordination Services Pharmacy Team  Reason for Encounter: Appointment Reminder  Contacted patient to confirm in office appointment with Delano Metz, PharmD on 07/14/2022 at 2:00. Patient declines our services and would like to be unenrolled, she has requested this appointment be canceled. Patient states her medications are well reviewed with her physicians and she has no issues or concerns with her medications.    Chart review:  Recent office visits:   11/29/2021 - [name], [name], MD - Patient was seen for Medicare annual wellness visit, subsequent  and additional concerns. No medication changes.  Recent consult visits:  06/26/2022 - [name] DPM - Patient was seen for ingrown nail. Discontinued metoprolol.   02/16/2022 - [name] MD (cardiology) - Patient was seen for atrial fibrillation. No medication changes.   Hospital visits:  None   Care Gaps: AWV - completed 11/29/2021 Last A1C - 5.7 on 11/29/2021 Shingrix - never done Covid - overdue  Star Rating Drugs:  Lisinopril 10 mg - [facility] Simvastatin 20 mg - last filled [facility]/12/2022 [facility]  [name] [facility]  Clinical Pharmacist Assistant [phone]

## 2022-08-01 ENCOUNTER — Other Ambulatory Visit (HOSPITAL_COMMUNITY): Payer: Self-pay | Admitting: Physician Assistant

## 2022-08-03 ENCOUNTER — Telehealth: Payer: Self-pay | Admitting: Internal Medicine

## 2022-08-03 MED ORDER — SIMVASTATIN 20 MG PO TABS: 20.0000 mg | ORAL_TABLET | Freq: Every day | ORAL | 1 refills | Status: DC

## 2022-08-03 NOTE — Telephone Encounter (Signed)
Refill sent.

## 2022-08-03 NOTE — Telephone Encounter (Signed)
Prescription Request  08/03/2022  LOV: 11/29/2021  What is the name of the medication or equipment? simvastatin (ZOCOR) 20 MG tablet (Expired)  previously written by another provider. Says she has 1 tablet left  Have you contacted your pharmacy to request a refill? No   Which pharmacy would you like this sent to?  [id number] [place], [place] - [address] [place] [address] Phone: [phone] Fax: [phone]    Patient notified that their request is being sent to the clinical staff for review and that they should receive a response within 2 business days.   Please advise at Mobile [phone] (mobile)

## 2022-08-04 ENCOUNTER — Other Ambulatory Visit (HOSPITAL_COMMUNITY): Payer: Self-pay | Admitting: Physician Assistant

## 2022-08-14 DIAGNOSIS — E039 Hypothyroidism, unspecified: Secondary | ICD-10-CM | POA: Diagnosis not present

## 2022-08-14 DIAGNOSIS — R7301 Impaired fasting glucose: Secondary | ICD-10-CM | POA: Diagnosis not present

## 2022-08-14 DIAGNOSIS — E559 Vitamin D deficiency, unspecified: Secondary | ICD-10-CM | POA: Diagnosis not present

## 2022-08-21 DIAGNOSIS — M7741 Metatarsalgia, right foot: Secondary | ICD-10-CM | POA: Diagnosis not present

## 2022-08-21 DIAGNOSIS — G894 Chronic pain syndrome: Secondary | ICD-10-CM | POA: Diagnosis not present

## 2022-08-21 DIAGNOSIS — G603 Idiopathic progressive neuropathy: Secondary | ICD-10-CM | POA: Diagnosis not present

## 2022-08-21 DIAGNOSIS — M7742 Metatarsalgia, left foot: Secondary | ICD-10-CM | POA: Diagnosis not present

## 2022-08-30 DIAGNOSIS — E041 Nontoxic single thyroid nodule: Secondary | ICD-10-CM | POA: Diagnosis not present

## 2022-08-30 DIAGNOSIS — R7301 Impaired fasting glucose: Secondary | ICD-10-CM | POA: Diagnosis not present

## 2022-08-30 DIAGNOSIS — G609 Hereditary and idiopathic neuropathy, unspecified: Secondary | ICD-10-CM | POA: Diagnosis not present

## 2022-08-30 DIAGNOSIS — E039 Hypothyroidism, unspecified: Secondary | ICD-10-CM | POA: Diagnosis not present

## 2022-08-30 DIAGNOSIS — I10 Essential (primary) hypertension: Secondary | ICD-10-CM | POA: Diagnosis not present

## 2022-10-09 DIAGNOSIS — H04123 Dry eye syndrome of bilateral lacrimal glands: Secondary | ICD-10-CM | POA: Diagnosis not present

## 2022-10-09 DIAGNOSIS — H40033 Anatomical narrow angle, bilateral: Secondary | ICD-10-CM | POA: Diagnosis not present

## 2022-10-09 DIAGNOSIS — H18593 Other hereditary corneal dystrophies, bilateral: Secondary | ICD-10-CM | POA: Diagnosis not present

## 2022-10-09 DIAGNOSIS — H25812 Combined forms of age-related cataract, left eye: Secondary | ICD-10-CM | POA: Diagnosis not present

## 2022-10-09 DIAGNOSIS — H40013 Open angle with borderline findings, low risk, bilateral: Secondary | ICD-10-CM | POA: Diagnosis not present

## 2022-10-09 DIAGNOSIS — H25813 Combined forms of age-related cataract, bilateral: Secondary | ICD-10-CM | POA: Diagnosis not present

## 2022-10-09 DIAGNOSIS — H52213 Irregular astigmatism, bilateral: Secondary | ICD-10-CM | POA: Diagnosis not present

## 2022-10-09 DIAGNOSIS — H524 Presbyopia: Secondary | ICD-10-CM | POA: Diagnosis not present

## 2022-10-18 DIAGNOSIS — G894 Chronic pain syndrome: Secondary | ICD-10-CM | POA: Diagnosis not present

## 2022-10-18 DIAGNOSIS — G603 Idiopathic progressive neuropathy: Secondary | ICD-10-CM | POA: Diagnosis not present

## 2022-10-18 DIAGNOSIS — M7741 Metatarsalgia, right foot: Secondary | ICD-10-CM | POA: Diagnosis not present

## 2022-10-18 DIAGNOSIS — M7742 Metatarsalgia, left foot: Secondary | ICD-10-CM | POA: Diagnosis not present

## 2022-10-31 DIAGNOSIS — H2512 Age-related nuclear cataract, left eye: Secondary | ICD-10-CM | POA: Diagnosis not present

## 2022-10-31 DIAGNOSIS — H25812 Combined forms of age-related cataract, left eye: Secondary | ICD-10-CM | POA: Diagnosis not present

## 2022-11-16 DIAGNOSIS — G629 Polyneuropathy, unspecified: Secondary | ICD-10-CM | POA: Diagnosis not present

## 2022-11-16 DIAGNOSIS — H903 Sensorineural hearing loss, bilateral: Secondary | ICD-10-CM | POA: Diagnosis not present

## 2022-11-16 DIAGNOSIS — H8102 Meniere's disease, left ear: Secondary | ICD-10-CM | POA: Diagnosis not present

## 2022-11-16 DIAGNOSIS — Z96651 Presence of right artificial knee joint: Secondary | ICD-10-CM | POA: Diagnosis not present

## 2022-11-16 DIAGNOSIS — H9313 Tinnitus, bilateral: Secondary | ICD-10-CM | POA: Diagnosis not present

## 2022-11-22 ENCOUNTER — Encounter (HOSPITAL_COMMUNITY): Payer: Self-pay | Admitting: Physician Assistant

## 2022-11-22 ENCOUNTER — Ambulatory Visit (HOSPITAL_COMMUNITY)
Admission: RE | Admit: 2022-11-22 | Discharge: 2022-11-22 | Disposition: A | Discharge status: Home / Self Care | Payer: Medicare Other | Source: Ambulatory Visit | Attending: Physician Assistant | Admitting: Physician Assistant

## 2022-11-22 ENCOUNTER — Other Ambulatory Visit (HOSPITAL_COMMUNITY): Payer: Self-pay | Admitting: Physician Assistant

## 2022-11-22 VITALS — BP 140/100 | HR 60 | Ht 66.0 in | Wt 195.0 lb

## 2022-11-22 DIAGNOSIS — D6869 Other thrombophilia: Secondary | ICD-10-CM | POA: Insufficient documentation

## 2022-11-22 DIAGNOSIS — Z5181 Encounter for therapeutic drug level monitoring: Secondary | ICD-10-CM | POA: Diagnosis not present

## 2022-11-22 DIAGNOSIS — Z7901 Long term (current) use of anticoagulants: Secondary | ICD-10-CM | POA: Insufficient documentation

## 2022-11-22 DIAGNOSIS — I4819 Other persistent atrial fibrillation: Secondary | ICD-10-CM | POA: Diagnosis present

## 2022-11-22 DIAGNOSIS — I340 Nonrheumatic mitral (valve) insufficiency: Secondary | ICD-10-CM | POA: Diagnosis not present

## 2022-11-22 DIAGNOSIS — E039 Hypothyroidism, unspecified: Secondary | ICD-10-CM | POA: Insufficient documentation

## 2022-11-22 DIAGNOSIS — I119 Hypertensive heart disease without heart failure: Secondary | ICD-10-CM | POA: Insufficient documentation

## 2022-11-22 DIAGNOSIS — Z79899 Other long term (current) drug therapy: Secondary | ICD-10-CM | POA: Insufficient documentation

## 2022-11-22 LAB — BASIC METABOLIC PANEL WITH GFR
Anion gap: 9 (ref 5–15)
BUN: 13 mg/dL (ref 8–23)
CO2: 28 mmol/L (ref 22–32)
Calcium: 9.5 mg/dL (ref 8.9–10.3)
Chloride: 103 mmol/L (ref 98–111)
Creatinine, Ser: 0.87 mg/dL (ref 0.44–1.00)
GFR, Estimated: >60 mL/min (ref >60)
Glucose, Bld: 97 mg/dL (ref 70–99)
Potassium: 4.3 mmol/L (ref 3.5–5.1)
Sodium: 140 mmol/L (ref 135–145)

## 2022-11-22 LAB — CBC
HCT: 40.4 % (ref 36.0–46.0)
Hemoglobin: 13.3 g/dL (ref 12.0–15.0)
MCH: 30 pg (ref 26.0–34.0)
MCHC: 32.9 g/dL (ref 30.0–36.0)
MCV: 91 fL (ref 80.0–100.0)
Platelets: 234 10*3/uL (ref 150–400)
RBC: 4.44 MIL/uL (ref 3.87–5.11)
RDW: 12.7 % (ref 11.5–15.5)
WBC: 4.3 10*3/uL (ref 4.0–10.5)
nRBC: 0 % (ref 0.0–0.2)

## 2022-11-22 LAB — MAGNESIUM: Magnesium: 2.3 mg/dL (ref 1.7–2.4)

## 2022-11-22 NOTE — Progress Notes (Signed)
Primary Care Physician: [name], [name], MD Primary Cardiologist: Dr [name] Primary Electrophysiologist: Dr Elberta Fortis Referring Physician: Dr [name]   Brandi Spence is a 77 y.o. female with a history of HTN, hypothyroid, HLD, atrial fibrillation who presents for follow up in the [facility]. The patient was initially diagnosed with atrial fibrillation 11/04/20 at a visit with her PCP although she was not told she had afib at that time. She was unaware of her arrhythmia with no symptoms. She was scheduled for total knee replacement on 01/17/21 but this was cancelled due to patient being in afib. Patient is on Eliquis for a CHADS2VASC score of 4. Seen by Dr Wyline Mood on 01/18/21 and started on BB. Patient is s/p DCCV on 02/08/21. Unfortunately, she was back in afib at follow up. She purchased a Kardia mobile device on 02/11/21 which showed afib at that time. Patient is s/p DCCV on 03/28/21. Her BB was stopped and her flecainide was decreased due to bradycardia. She did feel better in SR with less dyspnea on exertion.   Patient is s/p dofetilide admission 3/7-3/10/23 with DCCV on 05/12/21.   On follow up today, patient reports that she has done well since her last visit. She had two episodes of afib lasting < 24 hours. There were no specific triggers that she could identify. No bleeding issues on anticoagulation.   Today, she denies symptoms of palpitations, chest pain, shortness of breath, orthopnea, PND, lower extremity edema, dizziness, presyncope, syncope, snoring, daytime somnolence, bleeding, or neurologic sequela. The patient is tolerating medications without difficulties and is otherwise without complaint today.    Atrial Fibrillation Risk Factors:  she does not have symptoms or diagnosis of sleep apnea. she does not have a history of rheumatic fever. she does not have a history of alcohol use. The patient does not have a history of early  familial atrial fibrillation or other arrhythmias.   Atrial Fibrillation Management history:  Previous antiarrhythmic drugs: flecainide, dofetilide   Previous cardioversions: 02/08/21, 03/28/21, 05/12/21 Previous ablations: none Anticoagulation history: Eliquis   Past Medical History:  Diagnosis Date   Arthritis    Dysrhythmia    A.fib   Family history of adverse reaction to anesthesia    Mother and sister have PONV   History of kidney stones    Hyperlipidemia    Hypertension    Hypothyroidism    Neuropathy    feet   Pre-diabetes    Primary localized osteoarthritis of right knee 01/05/2021    Current Outpatient Medications  Medication Sig Dispense Refill   cetirizine (ZYRTEC) 10 MG tablet Take 10 mg by mouth at bedtime.     diltiazem (CARDIZEM) 30 MG tablet TAKE 1 TABLET EVERY 4 HOURS AS NEEDED FOR HEART RATE >100 30 tablet 1   docusate sodium (COLACE) 100 MG capsule Take 100 mg by mouth at bedtime.     dofetilide (TIKOSYN) 250 MCG capsule Take 1 capsule (250 mcg total) by mouth 2 (two) times daily. 180 capsule 2   ELIQUIS 5 MG TABS tablet TAKE 1 TABLET TWICE A DAY 180 tablet 1   FIBER PO Take 2 tablets by mouth at bedtime.     furosemide (LASIX) 20 MG tablet TAKE 1 TABLET (20 MG TOTAL) BY MOUTH DAILY AS NEEDED FOR EDEMA. 90 tablet 1   levothyroxine (SYNTHROID, LEVOTHROID) 75 MCG tablet Take 75 mcg by mouth daily before breakfast.     lisinopril (ZESTRIL) 10 MG tablet TAKE 1  TABLET BY MOUTH EVERY DAY 90 tablet 1   MELATONIN PO Take 1 tablet by mouth at bedtime.     OVER THE COUNTER MEDICATION Take 2 tablets by mouth daily. BondiBoost Hair Growth Supplement     PERCOCET 5-325 MG tablet Take 1 tablet by mouth every 6 (six) hours as needed.     potassium gluconate 595 (99 K) MG TABS tablet Take 595 mg by mouth daily.     pregabalin (LYRICA) 200 MG capsule Take 200 mg by mouth in the morning and at bedtime.     simvastatin (ZOCOR) 20 MG tablet Take 1 tablet (20 mg total) by mouth  at bedtime. 90 tablet 1   No current facility-administered medications for this encounter.    ROS- All systems are reviewed and negative except as per the HPI above.  Physical Exam: Vitals:   11/22/22 1129  BP: (!) 140/100  Pulse: 60  Weight: 88.5 kg  Height: 5\' 6"  (1.676 m)     GEN: Well nourished, well developed in no acute distress NECK: No JVD; No carotid bruits CARDIAC: Regular rate and rhythm, no murmurs, rubs, gallops RESPIRATORY:  Clear to auscultation without rales, wheezing or rhonchi  ABDOMEN: Soft, non-tender, non-distended EXTREMITIES:  No edema; No deformity    Wt Readings from Last 3 Encounters:  11/22/22 88.5 kg  05/17/22 90.8 kg  02/16/22 87 kg    EKG today demonstrates  SR Vent. rate 60 BPM PR interval 188 ms QRS duration 70 ms QT/QTcB 442/442 ms  Echo 02/01/21 demonstrated  1. Left ventricular ejection fraction, by estimation, is 55 to 60%. The  left ventricle has normal function. The left ventricle has no regional  wall motion abnormalities. Left ventricular diastolic function could not  be evaluated.   2. Right ventricular systolic function is low normal. The right  ventricular size is normal. There is moderately elevated pulmonary artery systolic pressure. The estimated right ventricular systolic pressure is 47.3 mmHg.   3. Left atrial size was moderately dilated.   4. The mitral valve is grossly normal. Mild to moderate mitral valve  regurgitation.   5. The aortic valve is tricuspid. Aortic valve regurgitation is not  visualized.   6. The inferior vena cava is dilated in size with <50% respiratory  variability, suggesting right atrial pressure of 15 mmHg.   Comparison(s): No prior Echocardiogram.   Epic records are reviewed at length today  CHA2DS2-VASc Score = 4  The patient's score is based upon: CHF History: 0 HTN History: 1 Diabetes History: 0 Stroke History: 0 Vascular Disease History: 0 Age Score: 2 Gender Score: 1         ASSESSMENT AND PLAN: Persistent Atrial Fibrillation (ICD10:  I48.19) The patient's CHA2DS2-VASc score is 4, indicating a 4.8% annual risk of stroke.   S/p dofetilide admission 3/7-3/10/23  Patient having rare episodes. We did discuss the possibility of doing an ablation which may allow her to discontinue AAD. She is agreeable to discussing with Dr Elberta Fortis, will have her follow up with him for her next visit.  Continue dofetilide 250 mcg BID, QT stable Check bmet/mag/cbc today.  Continue Eliquis 5 mg BID Change diltiazem 30 mg to PRN q 4 hours for heart racing.  Kardia for home monitoring.   Secondary Hypercoagulable State (ICD10:  D68.69) The patient is at significant risk for stroke/thromboembolism based upon her CHA2DS2-VASc Score of 4.  Continue Apixaban (Eliquis).   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    Jorja Loa PA-C [facility] [address] [place], [place] [address] [phone] 11/22/2022 11:39 AM

## 2022-11-24 DIAGNOSIS — Z1231 Encounter for screening mammogram for malignant neoplasm of breast: Secondary | ICD-10-CM | POA: Diagnosis not present

## 2022-11-24 LAB — HM MAMMOGRAPHY

## 2022-11-28 ENCOUNTER — Encounter: Payer: Self-pay | Admitting: Internal Medicine

## 2022-11-28 DIAGNOSIS — H25811 Combined forms of age-related cataract, right eye: Secondary | ICD-10-CM | POA: Diagnosis not present

## 2022-12-11 ENCOUNTER — Other Ambulatory Visit (HOSPITAL_COMMUNITY): Payer: Self-pay | Admitting: Physician Assistant

## 2022-12-13 DIAGNOSIS — H2511 Age-related nuclear cataract, right eye: Secondary | ICD-10-CM | POA: Diagnosis not present

## 2022-12-13 DIAGNOSIS — H25811 Combined forms of age-related cataract, right eye: Secondary | ICD-10-CM | POA: Diagnosis not present

## 2022-12-21 DIAGNOSIS — M7742 Metatarsalgia, left foot: Secondary | ICD-10-CM | POA: Diagnosis not present

## 2022-12-21 DIAGNOSIS — M7741 Metatarsalgia, right foot: Secondary | ICD-10-CM | POA: Diagnosis not present

## 2022-12-21 DIAGNOSIS — G603 Idiopathic progressive neuropathy: Secondary | ICD-10-CM | POA: Diagnosis not present

## 2022-12-21 DIAGNOSIS — G894 Chronic pain syndrome: Secondary | ICD-10-CM | POA: Diagnosis not present

## 2023-01-17 ENCOUNTER — Telehealth: Payer: Self-pay | Admitting: Internal Medicine

## 2023-01-17 ENCOUNTER — Other Ambulatory Visit: Payer: Self-pay | Admitting: Internal Medicine

## 2023-01-17 MED ORDER — SIMVASTATIN 20 MG PO TABS: 20.0000 mg | ORAL_TABLET | Freq: Every day | ORAL | 0 refills | Status: DC

## 2023-01-17 NOTE — Telephone Encounter (Signed)
Pt needs -- Rx for Simvastatin renewed now as I am about to run out. [facility] [employer].

## 2023-01-17 NOTE — Telephone Encounter (Signed)
Refill was sent.  Patient needs an office visit for more refills.

## 2023-01-23 ENCOUNTER — Other Ambulatory Visit (HOSPITAL_COMMUNITY): Payer: Self-pay | Admitting: Physician Assistant

## 2023-02-11 ENCOUNTER — Other Ambulatory Visit: Payer: Self-pay | Admitting: Internal Medicine

## 2023-02-11 DIAGNOSIS — I4819 Other persistent atrial fibrillation: Secondary | ICD-10-CM

## 2023-02-12 NOTE — Telephone Encounter (Signed)
Prescription refill request for Eliquis received. Indication:afib Last office visit:9/24 Scr:0.87  9/24 Age: 77 Weight:88.5  kg  Prescription refilled

## 2023-02-19 ENCOUNTER — Encounter: Payer: Self-pay | Admitting: Internal Medicine

## 2023-02-19 ENCOUNTER — Ambulatory Visit: Payer: Medicare Other | Attending: Internal Medicine | Admitting: Internal Medicine

## 2023-02-19 VITALS — BP 118/72 | HR 67 | Resp 16 | Ht 66.0 in | Wt 201.0 lb

## 2023-02-19 DIAGNOSIS — I4819 Other persistent atrial fibrillation: Secondary | ICD-10-CM | POA: Insufficient documentation

## 2023-02-19 NOTE — Patient Instructions (Signed)
Medication Instructions:  No changes  *If you need a refill on your cardiac medications before your next appointment, please call your pharmacy*   Follow-Up: At [facility], you and your health needs are our priority.  As part of our continuing mission to provide you with exceptional heart care, we have created designated Provider Care Teams.  These Care Teams include your primary Cardiologist (physician) and Advanced Practice Providers (APPs -  Physician Assistants and Nurse Practitioners) who all work together to provide you with the care you need, when you need it.    Your next appointment:   1 year(s)  Provider:   Maisie Fus, MD

## 2023-02-19 NOTE — Progress Notes (Signed)
Cardiology Office Note:    Date:  02/19/2023   ID:  Brandi, Spence [birth year], MRN [id number]  PCP:  [name], [name], MD   [facility] Providers Cardiologist:  Maisie Fus, MD Electrophysiologist:  Regan Lemming, MD     Referring MD: [name], Estel*   No chief complaint on file.  Atrial Fibrillation  History of Present Illness:    Brandi Spence is a 77 y.o. female with a hx of arthritis, hypothyroid, HTN planned for R TKA, found to be in atrial fibrillation rate controlled in the 70s  This is a new diagnosis for her. She went for a pre surgical check up in September. She had atrial fibrillation. She has no cardiac hx , no stress test no LHC.  Her parents had strokes later in life. Her father had a stroke in his 63s. She has siblings. Her sister had breast cancer. No stroke history. Her blood pressure is well controlled. She is retired now. Prior to that she was helping with her mother. She is consistently helping her family. She stays active in the community.   She denies exertional angina, dyspnea on exertion, LH, dizziness, syncopal episode. No orthopena , PND , no LE edema. Low concern for sleep apnea.   TSH 0.7 on levothyroxine. Has a thyroid nodule, stable. Crt 0.78   Interim Hx 02/16/2022  She feels well today.  EKG 01/16/2022 sinus bradycardia 58 bpm Qtc 433 ms. No SOB.   Interval hx 02/19/2023  She was seen by A-fib clinic; no changes were made at that time.  Plan is to consider a PVI if she has complications with recurrence/wants to transition off of AAD.    Today, she's had multiple episodes of afib from her Kardia mobile. She denies CP or SOB.  her blood pressure is well-controlled. she is doing well denies any dyspnea on exertion, with apnea or PND.    Past Medical History:  Diagnosis Date   Arthritis    Dysrhythmia    A.fib   Family history of adverse reaction to anesthesia    Mother and sister have PONV    History of kidney stones    Hyperlipidemia    Hypertension    Hypothyroidism    Neuropathy    feet   Pre-diabetes    Primary localized osteoarthritis of right knee 01/05/2021    Past Surgical History:  Procedure Laterality Date   ABDOMINAL HYSTERECTOMY  2005   BREAST ENHANCEMENT SURGERY Bilateral 2000   BUNIONECTOMY Right 2013   CARDIOVERSION N/A 02/08/2021   Procedure: CARDIOVERSION;  Surgeon: Little Ishikawa, MD;  Location: [facility] ENDOSCOPY;  Service: Cardiovascular;  Laterality: N/A;   CARDIOVERSION N/A 03/28/2021   Procedure: CARDIOVERSION;  Surgeon: Jake Bathe, MD;  Location: [facility] ENDOSCOPY;  Service: Cardiovascular;  Laterality: N/A;   CARDIOVERSION N/A 05/12/2021   Procedure: CARDIOVERSION;  Surgeon: Thurmon Fair, MD;  Location: [facility] ENDOSCOPY;  Service: Cardiovascular;  Laterality: N/A;   CARPAL TUNNEL RELEASE Right 1997   COLONOSCOPY  01/01/2013   EXCISION MORTON'S NEUROMA Bilateral 1980   right foot, 2004   KNEE ARTHROSCOPY Right 2006   2012   PAROTID GLAND TUMOR EXCISION  2009   POLYPECTOMY     SPINAL CORD STIMULATOR IMPLANT Left 2012   TARSAL TUNNEL RELEASE Right 2005   TONSILLECTOMY     TOTAL KNEE ARTHROPLASTY Right 09/12/2021   Procedure: TOTAL KNEE ARTHROPLASTY;  Surgeon: Joen Laura, MD;  Location: [facility] ORS;  Service: Orthopedics;  Laterality: Right;    Current Medications: No outpatient medications have been marked as taking for the 02/19/23 encounter (Appointment) with Maisie Fus, MD.     Allergies:   Prednisone   Social History   Socioeconomic History   Marital status: Divorced    Spouse name: Not on file   Number of children: Not on file   Years of education: Not on file   Highest education level: Not on file  Occupational History   Not on file  Tobacco Use   Smoking status: Former    Current packs/day: 0.00    Average packs/day: 1 pack/day for 5.0 years (5.0 ttl pk-yrs)    Types: Cigarettes    Start date: 06/27/1975    Quit  date: 06/26/1980    Years since quitting: 42.6   Smokeless tobacco: Never   Tobacco comments:    Former smoker 03/02/21  Vaping Use   Vaping status: Never Used  Substance and Sexual Activity   Alcohol use: Yes    Alcohol/week: 1.0 standard drink of alcohol    Types: 1 Standard drinks or equivalent per week    Comment: once a month 03/02/21   Drug use: No   Sexual activity: Not on file  Other Topics Concern   Not on file  Social History Narrative   Not on file   Social Drivers of Health   Financial Resource Strain: Low Risk  (10/27/2021)   Overall Financial Resource Strain (CARDIA)    Difficulty of Paying Living Expenses: Not hard at all  Food Insecurity: Low Risk  (11/16/2022)   Received from [facility]   Hunger Vital Sign    Worried About Running Out of Food in the Last Year: Never true    Ran Out of Food in the Last Year: Never true  Transportation Needs: No Transportation Needs (11/16/2022)   Received from [facility]    In the past 12 months, has lack of reliable transportation kept you from medical appointments, meetings, work or from getting things needed for daily living? : No  Physical Activity: Unknown (12/20/2021)   [facility]   PCARE Exercise SDOH    Exercise: Active Lifestyle Only;Indicated    PCare Exercise SDOH: Not on file    PCare Exercise SDOH: Not on file  Stress: No Stress Concern Present (10/27/2021)   [employer] of Occupational Health - Occupational Stress Questionnaire    Feeling of Stress : Not at all  Social Connections: Not on file     Family History: The patient's family history includes Colon cancer (age of onset: 35) in her father; Colon polyps in her father. There is no history of Esophageal cancer, Rectal cancer, or Stomach cancer.  ROS:   Please see the history of present illness.     All other systems reviewed and are negative.  EKGs/Labs/Other Studies  Reviewed:    The following studies were reviewed today:   EKG:  EKG is  ordered today.  The ekg ordered today demonstrates   01/18/2021 EKG-Atrial Fibrillation rate in the 70s; QTc 403 ms 11/04/2020: Atrial fibrillation rate controlled, low voltage 02/14/2016:sinus bradycardia 59 , low voltage   TTE 02/01/2021- EF 55-60%,  RV is nl,  LA vol index ~ 42 cc/m2, mild-moderate MR, RVSP 47.3 mmHg  Recent Labs: 11/22/2022: BUN 13; Creatinine, Ser 0.87; Hemoglobin 13.3; Magnesium 2.3; Platelets 234; Potassium 4.3; Sodium 140   Recent Lipid Panel    Component  Value Date/Time   CHOL 173 11/29/2021 1440   TRIG 179.0 (H) 11/29/2021 1440   HDL 52.00 11/29/2021 1440   CHOLHDL 3 11/29/2021 1440   VLDL 35.8 11/29/2021 1440   LDLCALC 85 11/29/2021 1440   LDLDIRECT 112.0 06/26/2019 1058     Risk Assessment/Calculations:    CHA2DS2-VASc Score = 4   This indicates a 4.8% annual risk of stroke. The patient's score is based upon: CHF History: 0 HTN History: 1 Diabetes History: 0 Stroke History: 0 Vascular Disease History: 0 Age Score: 2 Gender Score: 1          Physical Exam:    VS:   Vitals:   02/19/23 1506  BP: 118/72  Pulse: 67  Resp: 16  SpO2: 98%      Wt Readings from Last 3 Encounters:  11/22/22 195 lb (88.5 kg)  05/17/22 200 lb 3.2 oz (90.8 kg)  02/16/22 191 lb 12.8 oz (87 kg)     GEN:  Well nourished, well developed in no acute distress. Looks younger than stated age HEENT: Normal NECK: No JVD; No carotid bruits LYMPHATICS: No lymphadenopathy CARDIAC: irregular.irregular, no murmurs, rubs, gallops RESPIRATORY:  Clear to auscultation without rales, wheezing or rhonchi  ABDOMEN: Soft, non-tender, non-distended MUSCULOSKELETAL:  No edema; No deformity  SKIN: Warm and dry NEUROLOGIC:  Alert and oriented x 3 PSYCHIATRIC:  Normal affect   ASSESSMENT:    #New Onset Atrial Fibrillation: Chads2vasc 4. TSH was nl on synthroid . Normal prior liver fxn. As far as  we know she's been in it for two months. She is rate controlled. No hx of ischemic heart disease. No signs of CHF. S/p DCCV 02/08/21. Had recurrent afib 12/9 noted on Kardia mobile. DCCV 03/28/21. Her BB was stopped and her flecainide was decreased with bradycardia. She was admitted for initiation of dofetilide 05/10/2021-05/13/2021.  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 Resolution challenging, valve appears degenerative. Will continue surveillance q 3-5 years  #HTN- continue current medication  #HLD- on simvastatin 20 mg daily   PLAN:    In order of problems listed above:  Follow up 12 months      Medication Adjustments/Labs and Tests Ordered: Current medicines are reviewed at length with the patient today.  Concerns regarding medicines are outlined above.    Signed, Maisie Fus, MD  02/19/2023 2:49 PM    [facility]

## 2023-02-21 ENCOUNTER — Other Ambulatory Visit: Payer: Self-pay | Admitting: Internal Medicine

## 2023-02-24 ENCOUNTER — Other Ambulatory Visit: Payer: Self-pay | Admitting: Internal Medicine

## 2023-03-05 ENCOUNTER — Other Ambulatory Visit (HOSPITAL_COMMUNITY): Payer: Self-pay | Admitting: Physician Assistant

## 2023-03-05 MED ORDER — DILTIAZEM HCL 30 MG PO TABS: 30.0000 mg | ORAL_TABLET | Freq: Two times a day (BID) | ORAL | 1 refills | Status: DC

## 2023-03-05 NOTE — Addendum Note (Signed)
Addended by: Shona Simpson on: 03/05/2023 12:01 PM   Modules accepted: Orders

## 2023-03-15 ENCOUNTER — Encounter: Payer: Self-pay | Admitting: Cardiology

## 2023-03-15 ENCOUNTER — Ambulatory Visit: Payer: Medicare Other | Attending: Cardiology | Admitting: Cardiology

## 2023-03-15 ENCOUNTER — Encounter: Payer: Self-pay | Admitting: Internal Medicine

## 2023-03-15 ENCOUNTER — Ambulatory Visit (INDEPENDENT_AMBULATORY_CARE_PROVIDER_SITE_OTHER): Payer: Medicare Other | Admitting: Internal Medicine

## 2023-03-15 ENCOUNTER — Telehealth: Payer: Self-pay | Admitting: Gastroenterology

## 2023-03-15 VITALS — BP 110/84 | HR 62 | Temp 97.6°F | Ht 67.0 in | Wt 200.8 lb

## 2023-03-15 VITALS — BP 130/90 | HR 68 | Ht 66.0 in | Wt 200.0 lb

## 2023-03-15 DIAGNOSIS — Z0000 Encounter for general adult medical examination without abnormal findings: Secondary | ICD-10-CM

## 2023-03-15 DIAGNOSIS — E559 Vitamin D deficiency, unspecified: Secondary | ICD-10-CM | POA: Diagnosis not present

## 2023-03-15 DIAGNOSIS — Z79899 Other long term (current) drug therapy: Secondary | ICD-10-CM | POA: Insufficient documentation

## 2023-03-15 DIAGNOSIS — Z780 Asymptomatic menopausal state: Secondary | ICD-10-CM

## 2023-03-15 DIAGNOSIS — I10 Essential (primary) hypertension: Secondary | ICD-10-CM

## 2023-03-15 DIAGNOSIS — Z01812 Encounter for preprocedural laboratory examination: Secondary | ICD-10-CM | POA: Insufficient documentation

## 2023-03-15 DIAGNOSIS — E7800 Pure hypercholesterolemia, unspecified: Secondary | ICD-10-CM | POA: Diagnosis not present

## 2023-03-15 DIAGNOSIS — I4819 Other persistent atrial fibrillation: Secondary | ICD-10-CM | POA: Insufficient documentation

## 2023-03-15 DIAGNOSIS — D6869 Other thrombophilia: Secondary | ICD-10-CM | POA: Insufficient documentation

## 2023-03-15 DIAGNOSIS — E039 Hypothyroidism, unspecified: Secondary | ICD-10-CM | POA: Diagnosis not present

## 2023-03-15 NOTE — Progress Notes (Signed)
 Medicare Wellness question were answered 03/14/2023.

## 2023-03-15 NOTE — Patient Instructions (Signed)
 Medication Instructions:  Your physician recommends that you continue on your current medications as directed. Please refer to the Current Medication list given to you today.  *If you need a refill on your cardiac medications before your next appointment, please call your pharmacy*   Lab Work: Pre procedure labs -- we will call you to schedule:  BMP & CBC  If you have a lab test that is abnormal and we need to change your treatment, we will call you to review the results -- otherwise no news is good news.    Testing/Procedures: Your physician has requested that you have cardiac CT 1 month PRIOR to your ablation. Cardiac computed tomography (CT) is a painless test that uses an x-ray machine to take clear, detailed pictures of your heart.  We will call you to schedule.  Your physician has recommended that you have an ablation. Catheter ablation is a medical procedure used to treat some cardiac arrhythmias (irregular heartbeats). During catheter ablation, a long, thin, flexible tube is put into a blood vessel in your groin (upper thigh), or neck. This tube is called an ablation catheter. It is then guided to your heart through the blood vessel. Radio frequency waves destroy small areas of heart tissue where abnormal heartbeats may cause an arrhythmia to start.   Your ablation is scheduled for 06/29/2023. Please arrive at [facility] at 8:00 am.  We will call you for further instructions.   Follow-Up: At [facility], you and your health needs are our priority.  As part of our continuing mission to provide you with exceptional heart care, we have created designated Provider Care Teams.  These Care Teams include your primary Cardiologist (physician) and Advanced Practice Providers (APPs -  Physician Assistants and Nurse Practitioners) who all work together to provide you with the care you need, when you need it.  Your next appointment:   1 month(s) after your ablation  The format for  your next appointment:   In Person  Provider:   AFib clinic   Thank you for choosing [facility]!!   Maeola Domino, RN [phone]    Other Instructions   Cardiac Ablation Cardiac ablation is a procedure to destroy (ablate) some heart tissue that is sending bad signals. These bad signals cause problems in heart rhythm. The heart has many areas that make these signals. If there are problems in these areas, they can make the heart beat in a way that is not normal. Destroying some tissues can help make the heart rhythm normal. Tell your doctor about: Any allergies you have. All medicines you are taking. These include vitamins, herbs, eye drops, creams, and over-the-counter medicines. Any problems you or family members have had with medicines that make you fall asleep (anesthetics). Any blood disorders you have. Any surgeries you have had. Any medical conditions you have, such as kidney failure. Whether you are pregnant or may be pregnant. What are the risks? This is a safe procedure. But problems may occur, including: Infection. Bruising and bleeding. Bleeding into the chest. Stroke or blood clots. Damage to nearby areas of your body. Allergies to medicines or dyes. The need for a pacemaker if the normal system is damaged. Failure of the procedure to treat the problem. What happens before the procedure? Medicines Ask your doctor about: Changing or stopping your normal medicines. This is important. Taking aspirin and ibuprofen. Do not take these medicines unless your doctor tells you to take them. Taking other medicines, vitamins, herbs, and  supplements. General instructions Follow instructions from your doctor about what you cannot eat or drink. Plan to have someone take you home from the hospital or clinic. If you will be going home right after the procedure, plan to have someone with you for 24 hours. Ask your doctor what steps will be taken to prevent  infection. What happens during the procedure?  An IV tube will be put into one of your veins. You will be given a medicine to help you relax. The skin on your neck or groin will be numbed. A cut (incision) will be made in your neck or groin. A needle will be put through your cut and into a large vein. A tube (catheter) will be put into the needle. The tube will be moved to your heart. Dye may be put through the tube. This helps your doctor see your heart. Small devices (electrodes) on the tube will send out signals. A type of energy will be used to destroy some heart tissue. The tube will be taken out. Pressure will be held on your cut. This helps stop bleeding. A bandage will be put over your cut. The exact procedure may vary among doctors and hospitals. What happens after the procedure? You will be watched until you leave the hospital or clinic. This includes checking your heart rate, breathing rate, oxygen, and blood pressure. Your cut will be watched for bleeding. You will need to lie still for a few hours. Do not drive for 24 hours or as long as your doctor tells you. Summary Cardiac ablation is a procedure to destroy some heart tissue. This is done to treat heart rhythm problems. Tell your doctor about any medical conditions you may have. Tell him or her about all medicines you are taking to treat them. This is a safe procedure. But problems may occur. These include infection, bruising, bleeding, and damage to nearby areas of your body. Follow what your doctor tells you about food and drink. You may also be told to change or stop some of your medicines. After the procedure, do not drive for 24 hours or as long as your doctor tells you. This information is not intended to replace advice given to you by your health care provider. Make sure you discuss any questions you have with your health care provider. Document Revised: 05/13/2021 Document Reviewed: 01/23/2019 Elsevier Patient  Education  2023 Elsevier Inc.   Cardiac Ablation, Care After  This sheet gives you information about how to care for yourself after your procedure. Your health care provider may also give you more specific instructions. If you have problems or questions, contact your health care provider. What can I expect after the procedure? After the procedure, it is common to have: Bruising around your puncture site. Tenderness around your puncture site. Skipped heartbeats. If you had an atrial fibrillation ablation, you may have atrial fibrillation during the first several months after your procedure.  Tiredness (fatigue).  Follow these instructions at home: Puncture site care  Follow instructions from your health care provider about how to take care of your puncture site. Make sure you: If present, leave stitches (sutures), skin glue, or adhesive strips in place. These skin closures may need to stay in place for up to 2 weeks. If adhesive strip edges start to loosen and curl up, you may trim the loose edges. Do not remove adhesive strips completely unless your health care provider tells you to do that. If a large square bandage is present, this  may be removed 24 hours after surgery.  Check your puncture site every day for signs of infection. Check for: Redness, swelling, or pain. Fluid or blood. If your puncture site starts to bleed, lie down on your back, apply firm pressure to the area, and contact your health care provider. Warmth. Pus or a bad smell. A pea or small marble sized lump at the site is normal and can take up to three months to resolve.  Driving Do not drive for at least 4 days after your procedure or however long your health care provider recommends. (Do not resume driving if you have previously been instructed not to drive for other health reasons.) Do not drive or use heavy machinery while taking prescription pain medicine. Activity Avoid activities that take a lot of effort for  at least 7 days after your procedure. Do not lift anything that is heavier than 5 lb (4.5 kg) for one week.  No sexual activity for 1 week.  Return to your normal activities as told by your health care provider. Ask your health care provider what activities are safe for you. General instructions Take over-the-counter and prescription medicines only as told by your health care provider. Do not use any products that contain nicotine or tobacco, such as cigarettes and e-cigarettes. If you need help quitting, ask your health care provider. You may shower after 24 hours, but Do not take baths, swim, or use a hot tub for 1 week.  Do not drink alcohol  for 24 hours after your procedure. Keep all follow-up visits as told by your health care provider. This is important. Contact a health care provider if: You have redness, mild swelling, or pain around your puncture site. You have fluid or blood coming from your puncture site that stops after applying firm pressure to the area. Your puncture site feels warm to the touch. You have pus or a bad smell coming from your puncture site. You have a fever. You have chest pain or discomfort that spreads to your neck, jaw, or arm. You have chest pain that is worse with lying on your back or taking a deep breath. You are sweating a lot. You feel nauseous. You have a fast or irregular heartbeat. You have shortness of breath. You are dizzy or light-headed and feel the need to lie down. You have pain or numbness in the arm or leg closest to your puncture site. Get help right away if: Your puncture site suddenly swells. Your puncture site is bleeding and the bleeding does not stop after applying firm pressure to the area. These symptoms may represent a serious problem that is an emergency. Do not wait to see if the symptoms will go away. Get medical help right away. Call your local emergency services (911 in the [place] Do not drive yourself to the  hospital. Summary After the procedure, it is normal to have bruising and tenderness at the puncture site in your groin, neck, or forearm. Check your puncture site every day for signs of infection. Get help right away if your puncture site is bleeding and the bleeding does not stop after applying firm pressure to the area. This is a medical emergency. This information is not intended to replace advice given to you by your health care provider. Make sure you discuss any questions you have with your health care provider.

## 2023-03-15 NOTE — Telephone Encounter (Signed)
 Good afternoon Dr. Lavon Paganini  The following patient called to schedule a colonoscopy per her pcp's request. She is 84 and feels is beyond the age limit and would like your recommendations on whether she needs it done. Please advise.

## 2023-03-15 NOTE — Progress Notes (Signed)
 Electrophysiology Office Note:   Date:  03/15/2023  ID:  Marilena, Trevathan [birth year], MRN [id number]  Primary Cardiologist: Alvan Ronal BRAVO, MD Primary Heart Failure: None Electrophysiologist: Timothea Bodenheimer Gladis Norton, MD      History of Present Illness:   KORINE WINTON is a 78 y.o. female with h/o hypertension, hypothyroidism, hyperlipidemia, atrial fibrillation seen today for routine electrophysiology followup.   Since last being seen in our clinic the patient reports increasing episodes of atrial fibrillation.  When she is in sinus rhythm she feels well.  When she is not atrial fibrillation, she feels weak, fatigued with some mild shortness of breath.  She has had 1 time where she has had to sit down due to dyspnea.  She has found no trigger for her atrial fibrillation.  She feels the dofetilide  is less effective.  she denies chest pain, palpitations, dyspnea, PND, orthopnea, nausea, vomiting, dizziness, syncope, edema, weight gain, or early satiety.   Review of systems complete and found to be negative unless listed in HPI.   EP Information / Studies Reviewed:    EKG is not ordered today. EKG from 02/19/23 reviewed which showed sinus rhythm        Risk Assessment/Calculations:    CHA2DS2-VASc Score = 4   This indicates a 4.8% annual risk of stroke. The patient's score is based upon: CHF History: 0 HTN History: 1 Diabetes History: 0 Stroke History: 0 Vascular Disease History: 0 Age Score: 2 Gender Score: 1            Physical Exam:   VS:  BP (!) 130/90 (BP Location: Left Arm, Patient Position: Sitting, Cuff Size: Large)   Pulse 68   Ht 5' 6 (1.676 m)   Wt 200 lb (90.7 kg)   SpO2 97%   BMI 32.28 kg/m    Wt Readings from Last 3 Encounters:  03/15/23 200 lb (90.7 kg)  02/19/23 201 lb (91.2 kg)  11/22/22 195 lb (88.5 kg)     GEN: Well nourished, well developed in no acute distress NECK: No JVD; No carotid bruits CARDIAC: Regular rate and rhythm, no murmurs,  rubs, gallops RESPIRATORY:  Clear to auscultation without rales, wheezing or rhonchi  ABDOMEN: Soft, non-tender, non-distended EXTREMITIES:  No edema; No deformity   ASSESSMENT AND PLAN:    1.  Persistent atrial fibrillation: Currently on dofetilide .  She is in sinus rhythm today but is having more frequent episodes of atrial fibrillation.  She would prefer an alternative rhythm control strategy to dofetilide .  She has thyroid  issues and thus would prefer to be off of amiodarone.  Nellie Pester plan for ablation.  Risk and benefits have been discussed.  She understands the risks and is agreed to the procedure.  Risk, benefits, and alternatives to EP study and radiofrequency/pulse field ablation for afib were also discussed in detail today. These risks include but are not limited to stroke, bleeding, vascular damage, tamponade, perforation, damage to the esophagus, lungs, and other structures, pulmonary vein stenosis, worsening renal function, and death. The patient understands these risk and wishes to proceed.  We Rykker Coviello therefore proceed with catheter ablation at the next available time.  Carto, ICE, anesthesia are requested for the procedure.  Mccauley Diehl also obtain CT PV protocol prior to the procedure to exclude LAA thrombus and further evaluate atrial anatomy.  2.  Second hypercoagulable state: Currently on Eliquis  for atrial fibrillation  3.  Obesity: Lifestyle modification encouraged  4.  High risk medication monitoring: Currently on dofetilide .  QTc remained stable.  Follow up with Dr. Inocencio as usual post procedure  Signed, Kenyata Guess Gladis Inocencio, MD

## 2023-03-15 NOTE — Progress Notes (Signed)
 Established Patient Office Visit     CC/Reason for Visit: Subsequent Medicare wellness visit  HPI: Brandi Spence is a 78 y.o. female who is coming in today for the above mentioned reasons. Past Medical History is significant for: Hyperlipidemia, vitamin D  deficiency, hypothyroidism, atrial fibrillation.  She tells me that she is being scheduled for an ablation in April.  She is otherwise feeling well.  Has routine eye and dental care.   Past Medical/Surgical History: Past Medical History:  Diagnosis Date   Arthritis    Dysrhythmia    A.fib   Family history of adverse reaction to anesthesia    Mother and sister have PONV   History of kidney stones    Hyperlipidemia    Hypertension    Hypothyroidism    Neuropathy    feet   Pre-diabetes    Primary localized osteoarthritis of right knee 01/05/2021    Past Surgical History:  Procedure Laterality Date   ABDOMINAL HYSTERECTOMY  2005   BREAST ENHANCEMENT SURGERY Bilateral 2000   BUNIONECTOMY Right 2013   CARDIOVERSION N/A 02/08/2021   Procedure: CARDIOVERSION;  Surgeon: Kate Lonni LITTIE, MD;  Location: [facility] ENDOSCOPY;  Service: Cardiovascular;  Laterality: N/A;   CARDIOVERSION N/A 03/28/2021   Procedure: CARDIOVERSION;  Surgeon: Jeffrie Oneil BROCKS, MD;  Location: [facility] ENDOSCOPY;  Service: Cardiovascular;  Laterality: N/A;   CARDIOVERSION N/A 05/12/2021   Procedure: CARDIOVERSION;  Surgeon: Francyne Headland, MD;  Location: [facility] ENDOSCOPY;  Service: Cardiovascular;  Laterality: N/A;   CARPAL TUNNEL RELEASE Right 1997   COLONOSCOPY  01/01/2013   EXCISION MORTON'S NEUROMA Bilateral 1980   right foot, 2004   KNEE ARTHROSCOPY Right 2006   2012   PAROTID GLAND TUMOR EXCISION  2009   POLYPECTOMY     SPINAL CORD STIMULATOR IMPLANT Left 2012   TARSAL TUNNEL RELEASE Right 2005   TONSILLECTOMY     TOTAL KNEE ARTHROPLASTY Right 09/12/2021   Procedure: TOTAL KNEE ARTHROPLASTY;  Surgeon: Edna Toribio LABOR, MD;  Location: [facility] ORS;   Service: Orthopedics;  Laterality: Right;    Social History:  reports that she quit smoking about 42 years ago. Her smoking use included cigarettes. She started smoking about 47 years ago. She has a 5 pack-year smoking history. She has never used smokeless tobacco. She reports current alcohol  use of about 1.0 standard drink of alcohol  per week. She reports that she does not use drugs.  Allergies: Allergies  Allergen Reactions   Prednisone Other (See Comments)    Could not sleep and agitated    Family History:  Family History  Problem Relation Age of Onset   Heart disease Mother    Colon cancer Father 70   Colon polyps Father    Esophageal cancer Neg Hx    Rectal cancer Neg Hx    Stomach cancer Neg Hx      Current Outpatient Medications:    cetirizine (ZYRTEC) 10 MG tablet, Take 10 mg by mouth at bedtime., Disp: , Rfl:    diltiazem  (CARDIZEM ) 30 MG tablet, Take 1 tablet (30 mg total) by mouth 2 (two) times daily., Disp: 180 tablet, Rfl: 1   docusate sodium  (COLACE) 100 MG capsule, Take 100 mg by mouth at bedtime., Disp: , Rfl:    dofetilide  (TIKOSYN ) 250 MCG capsule, TAKE 1 CAPSULE BY MOUTH 2 TIMES A DAY, Disp: 180 capsule, Rfl: 2   ELIQUIS  5 MG TABS tablet, TAKE 1 TABLET TWICE A DAY, Disp: 180 tablet, Rfl: 1   FIBER  PO, Take 2 tablets by mouth at bedtime., Disp: , Rfl:    furosemide  (LASIX ) 20 MG tablet, TAKE 1 TABLET (20 MG TOTAL) BY MOUTH DAILY AS NEEDED FOR EDEMA., Disp: 90 tablet, Rfl: 1   levothyroxine  (SYNTHROID , LEVOTHROID) 75 MCG tablet, Take 75 mcg by mouth daily before breakfast., Disp: , Rfl:    lisinopril  (ZESTRIL ) 10 MG tablet, TAKE 1 TABLET BY MOUTH EVERY DAY, Disp: 90 tablet, Rfl: 1   MELATONIN PO, Take 1 tablet by mouth at bedtime., Disp: , Rfl:    OVER THE COUNTER MEDICATION, Take 2 tablets by mouth daily. BondiBoost Hair Growth Supplement, Disp: , Rfl:    PERCOCET 5-325 MG tablet, Take 1 tablet by mouth every 6 (six) hours as needed., Disp: , Rfl:    potassium  gluconate 595 (99 K) MG TABS tablet, Take 595 mg by mouth daily., Disp: , Rfl:    pregabalin  (LYRICA ) 200 MG capsule, Take 200 mg by mouth in the morning and at bedtime., Disp: , Rfl:    simvastatin  (ZOCOR ) 20 MG tablet, TAKE 1 TABLET BY MOUTH EVERYDAY AT BEDTIME, Disp: 90 tablet, Rfl: 0  Review of Systems:  Negative unless indicated in HPI.   Physical Exam: Vitals:   03/15/23 1406  BP: 110/84  Pulse: 62  Temp: 97.6 F (36.4 C)  TempSrc: Oral  SpO2: 96%  Weight: 200 lb 12.8 oz (91.1 kg)  Height: 5' 7 (1.702 m)    Body mass index is 31.45 kg/m.   Physical Exam Vitals reviewed.  Constitutional:      General: She is not in acute distress.    Appearance: Normal appearance. She is not ill-appearing, toxic-appearing or diaphoretic.  HENT:     Head: Normocephalic.     Right Ear: Tympanic membrane, ear canal and external ear normal. There is no impacted cerumen.     Left Ear: Tympanic membrane, ear canal and external ear normal. There is no impacted cerumen.     Nose: Nose normal.     Mouth/Throat:     Mouth: Mucous membranes are moist.     Pharynx: Oropharynx is clear. No oropharyngeal exudate or posterior oropharyngeal erythema.  Eyes:     General: No scleral icterus.       Right eye: No discharge.        Left eye: No discharge.     Conjunctiva/sclera: Conjunctivae normal.     Pupils: Pupils are equal, round, and reactive to light.  Neck:     Vascular: No carotid bruit.  Cardiovascular:     Rate and Rhythm: Normal rate and regular rhythm.     Pulses: Normal pulses.     Heart sounds: Normal heart sounds.  Pulmonary:     Effort: Pulmonary effort is normal. No respiratory distress.     Breath sounds: Normal breath sounds.  Abdominal:     General: Abdomen is flat. Bowel sounds are normal.     Palpations: Abdomen is soft.  Musculoskeletal:        General: Normal range of motion.     Cervical back: Normal range of motion.  Skin:    General: Skin is warm and dry.   Neurological:     General: No focal deficit present.     Mental Status: She is alert and oriented to person, place, and time. Mental status is at baseline.  Psychiatric:        Mood and Affect: Mood normal.        Behavior: Behavior normal.  Thought Content: Thought content normal.        Judgment: Judgment normal.    Subsequent Medicare wellness visit   1. Risk factors, based on past  M,S,F - Cardiac Risk Factors include: advanced age (>96men, >32 women)   2.  Physical activities: Dietary issues and exercise activities discussed:      3.  Depression/mood:  Flowsheet Row Office Visit from 11/29/2021 in [facility] Total Score 1        4.  ADL's:    03/14/2023    6:26 PM 03/14/2023    4:43 PM  In your present state of health, do you have any difficulty performing the following activities:  Hearing? 0 1  Comment  hearing loss  Vision? 0 0  Difficulty concentrating or making decisions? 0 0  Walking or climbing stairs? 0 0  Dressing or bathing? 0 0  Doing errands, shopping? 0 0  Preparing Food and eating ? N N  Using the Toilet? N N  In the past six months, have you accidently leaked urine? N N  Do you have problems with loss of bowel control? N N  Managing your Medications? N N  Managing your Finances? N N  Housekeeping or managing your Housekeeping? N N     5.  Fall risk:     09/12/2021    7:25 PM 09/13/2021    8:00 AM 11/29/2021    1:52 PM 03/14/2023    4:45 PM 03/14/2023    6:26 PM  Fall Risk  Falls in the past year?   0 0   Was there an injury with Fall?   0 0 0  Fall Risk Category Calculator   0 0   Fall Risk Category (Retired)   Low    (RETIRED) Patient Fall Risk Level High fall risk High fall risk Low fall risk    Fall risk Follow up   Falls evaluation completed Falls evaluation completed      6.  Home safety: No problems identified   7.  Height weight, and visual acuity: height and weight as above,  vision/hearing: Vision Screening   Right eye Left eye Both eyes  Without correction 20/20 20/20 20/20   With correction        8.  Counseling: Counseling given: Not Answered Tobacco comments: Former smoker 03/02/21    9. Lab orders based on risk factors: Laboratory update will be reviewed   10. Cognitive assessment:        03/14/2023    4:47 PM  6CIT Screen  What Year? 0 points  What month? 0 points  What time? 0 points  Count back from 20 0 points  Months in reverse 0 points  Repeat phrase 0 points  Total Score 0 points     11. Screening: Patient provided with a written and personalized 5-10 year screening schedule in the AVS. Health Maintenance  Topic Date Due   COVID-19 Vaccine (6 - 2024-25 season) 11/05/2022   Yearly kidney health urinalysis for diabetes  11/30/2022   Colon Cancer Screening  03/02/2023   Yearly kidney function blood test for diabetes  11/22/2023   Medicare Annual Wellness Visit  03/14/2024   DTaP/Tdap/Td vaccine (4 - Td or Tdap) 03/31/2028   Pneumonia Vaccine  Completed   Flu Shot  Completed   DEXA scan (bone density measurement)  Completed   Hepatitis C Screening  Completed   Zoster (Shingles) Vaccine  Completed   HPV Vaccine  Aged Out    12. Provider List Update: Patient Care Team    Relationship Specialty Notifications Start End  Theophilus Andrews, Tully GRADE, MD PCP - General Internal Medicine  03/28/18   Alvan Ronal BRAVO, MD PCP - Cardiology Cardiology  01/18/21   Inocencio Soyla Lunger, MD PCP - Electrophysiology Cardiology  06/20/21      13. Advance Directives: Does Patient Have a Medical Advance Directive?: Yes Type of Advance Directive: Healthcare Power of Attorney, Living will, Out of facility DNR (pink MOST or yellow form) Does patient want to make changes to medical advance directive?: No - Patient declined Copy of Healthcare Power of Attorney in Chart?: No - copy requested  14. Opioids: Patient is not on any opioid prescriptions and  has no risk factors for a substance use disorder.   15.   Goals      Protect My Health     Timeframe:  Long-Range Goal Priority:  Medium Start Date:                             Expected End Date:                          - schedule and keep appointment for annual check-up    Why is this important?   Screening tests can find diseases early when they are easier to treat.  Your doctor or nurse will talk with you about which tests are important for you.  Getting shots for common diseases like the flu and shingles will help prevent them.     Notes:          I have personally reviewed and noted the following in the patient's chart:   Medical and social history Use of alcohol , tobacco or illicit drugs  Current medications and supplements Functional ability and status Nutritional status Physical activity Advanced directives List of other physicians Hospitalizations, surgeries, and ER visits in previous 12 months Vitals Screenings to include cognitive, depression, and falls Referrals and appointments  In addition, I have reviewed and discussed with patient certain preventive protocols, quality metrics, and best practice recommendations. A written personalized care plan for preventive services as well as general preventive health recommendations were provided to patient.   Impression and Plan:  Medicare annual wellness visit, subsequent  Hypercholesterolemia -     Lipid panel; Future  Essential hypertension -     CBC with Differential/Platelet; Future -     Comprehensive metabolic panel; Future  Hypothyroidism, unspecified type -     TSH; Future  Vitamin D  deficiency -     VITAMIN D  25 Hydroxy (Vit-D Deficiency, Fractures); Future  Postmenopausal estrogen deficiency -     DG Bone Density; Future  -Recommend routine eye and dental care. -Healthy lifestyle discussed in detail. -Labs to be updated today. -Prostate cancer screening: N/A Health Maintenance   Topic Date Due   COVID-19 Vaccine (6 - 2024-25 season) 11/05/2022   Yearly kidney health urinalysis for diabetes  11/30/2022   Colon Cancer Screening  03/02/2023   Yearly kidney function blood test for diabetes  11/22/2023   Medicare Annual Wellness Visit  03/14/2024   DTaP/Tdap/Td vaccine (4 - Td or Tdap) 03/31/2028   Pneumonia Vaccine  Completed   Flu Shot  Completed   DEXA scan (bone density measurement)  Completed   Hepatitis C Screening  Completed   Zoster (Shingles) Vaccine  Completed  HPV Vaccine  Aged Out     -Immunizations are up-to-date. -She will contact GI as she is now due for her 5-year screening colonoscopy. -DEXA scan requested.     Tully Theophilus Andrews, MD  Primary Care at [facility]

## 2023-03-16 LAB — LIPID PANEL
Cholesterol: 204 mg/dL — ABNORMAL HIGH (ref 0–200)
HDL: 62.4 mg/dL (ref >39.00)
LDL Cholesterol: 107 mg/dL — ABNORMAL HIGH (ref 0–99)
NonHDL: 141.91
Total CHOL/HDL Ratio: 3
Triglycerides: 175 mg/dL — ABNORMAL HIGH (ref 0.0–149.0)
VLDL: 35 mg/dL (ref 0.0–40.0)

## 2023-03-16 LAB — TSH: TSH: 0.93 u[IU]/mL (ref 0.35–5.50)

## 2023-03-16 LAB — COMPREHENSIVE METABOLIC PANEL
ALT: 13 U/L (ref 0–35)
AST: 16 U/L (ref 0–37)
Albumin: 4.6 g/dL (ref 3.5–5.2)
Alkaline Phosphatase: 69 U/L (ref 39–117)
BUN: 15 mg/dL (ref 6–23)
CO2: 32 meq/L (ref 19–32)
Calcium: 9.5 mg/dL (ref 8.4–10.5)
Chloride: 103 meq/L (ref 96–112)
Creatinine, Ser: 0.79 mg/dL (ref 0.40–1.20)
GFR: 71.81 mL/min (ref >60.00)
Glucose, Bld: 100 mg/dL — ABNORMAL HIGH (ref 70–99)
Potassium: 4.8 meq/L (ref 3.5–5.1)
Sodium: 143 meq/L (ref 135–145)
Total Bilirubin: 0.7 mg/dL (ref 0.2–1.2)
Total Protein: 6.8 g/dL (ref 6.0–8.3)

## 2023-03-16 LAB — CBC WITH DIFFERENTIAL/PLATELET
Basophils Absolute: 0.1 10*3/uL (ref 0.0–0.1)
Basophils Relative: 1.1 % (ref 0.0–3.0)
Eosinophils Absolute: 0.1 10*3/uL (ref 0.0–0.7)
Eosinophils Relative: 2.5 % (ref 0.0–5.0)
HCT: 41.5 % (ref 36.0–46.0)
Hemoglobin: 13.9 g/dL (ref 12.0–15.0)
Lymphocytes Relative: 32.7 % (ref 12.0–46.0)
Lymphs Abs: 1.6 10*3/uL (ref 0.7–4.0)
MCHC: 33.4 g/dL (ref 30.0–36.0)
MCV: 91.3 fL (ref 78.0–100.0)
Monocytes Absolute: 0.5 10*3/uL (ref 0.1–1.0)
Monocytes Relative: 9.4 % (ref 3.0–12.0)
Neutro Abs: 2.7 10*3/uL (ref 1.4–7.7)
Neutrophils Relative %: 54.3 % (ref 43.0–77.0)
Platelets: 250 10*3/uL (ref 150.0–400.0)
RBC: 4.55 Mil/uL (ref 3.87–5.11)
RDW: 13.2 % (ref 11.5–15.5)
WBC: 4.9 10*3/uL (ref 4.0–10.5)

## 2023-03-16 LAB — VITAMIN D 25 HYDROXY (VIT D DEFICIENCY, FRACTURES): VITD: 29.01 ng/mL — ABNORMAL LOW (ref 30.00–100.00)

## 2023-03-16 NOTE — Telephone Encounter (Signed)
 It is reasonable to stop surveillance colonoscopy if patient doesn't want to pursue it. She had a small/diminutive polyps removed in 2019

## 2023-03-22 DIAGNOSIS — M7742 Metatarsalgia, left foot: Secondary | ICD-10-CM | POA: Diagnosis not present

## 2023-03-22 DIAGNOSIS — G894 Chronic pain syndrome: Secondary | ICD-10-CM | POA: Diagnosis not present

## 2023-03-22 DIAGNOSIS — G603 Idiopathic progressive neuropathy: Secondary | ICD-10-CM | POA: Diagnosis not present

## 2023-03-22 DIAGNOSIS — M7741 Metatarsalgia, right foot: Secondary | ICD-10-CM | POA: Diagnosis not present

## 2023-04-03 ENCOUNTER — Ambulatory Visit (HOSPITAL_BASED_OUTPATIENT_CLINIC_OR_DEPARTMENT_OTHER)
Admission: RE | Admit: 2023-04-03 | Discharge: 2023-04-03 | Disposition: A | Discharge status: Home / Self Care | Payer: Medicare Other | Source: Ambulatory Visit | Attending: Internal Medicine | Admitting: Internal Medicine

## 2023-04-03 DIAGNOSIS — Z780 Asymptomatic menopausal state: Secondary | ICD-10-CM | POA: Diagnosis not present

## 2023-04-04 ENCOUNTER — Encounter: Payer: Self-pay | Admitting: Internal Medicine

## 2023-04-05 ENCOUNTER — Other Ambulatory Visit: Payer: Self-pay | Admitting: Internal Medicine

## 2023-04-05 ENCOUNTER — Encounter: Payer: Self-pay | Admitting: Internal Medicine

## 2023-04-05 DIAGNOSIS — E559 Vitamin D deficiency, unspecified: Secondary | ICD-10-CM

## 2023-04-05 DIAGNOSIS — E7800 Pure hypercholesterolemia, unspecified: Secondary | ICD-10-CM

## 2023-04-05 MED ORDER — VITAMIN D (ERGOCALCIFEROL) 1.25 MG (50000 UNIT) PO CAPS: 50000.0000 [IU] | ORAL_CAPSULE | ORAL | 0 refills | Status: AC

## 2023-04-09 DIAGNOSIS — M79641 Pain in right hand: Secondary | ICD-10-CM | POA: Diagnosis not present

## 2023-04-09 DIAGNOSIS — M65351 Trigger finger, right little finger: Secondary | ICD-10-CM | POA: Diagnosis not present

## 2023-04-09 DIAGNOSIS — M79642 Pain in left hand: Secondary | ICD-10-CM | POA: Diagnosis not present

## 2023-04-09 DIAGNOSIS — M180 Bilateral primary osteoarthritis of first carpometacarpal joints: Secondary | ICD-10-CM | POA: Diagnosis not present

## 2023-04-09 DIAGNOSIS — M65352 Trigger finger, left little finger: Secondary | ICD-10-CM | POA: Diagnosis not present

## 2023-04-24 ENCOUNTER — Other Ambulatory Visit (HOSPITAL_COMMUNITY): Payer: Self-pay

## 2023-04-24 DIAGNOSIS — I4819 Other persistent atrial fibrillation: Secondary | ICD-10-CM

## 2023-04-24 MED ORDER — APIXABAN 5 MG PO TABS
5.0000 mg | ORAL_TABLET | Freq: Two times a day (BID) | ORAL | Status: DC
Start: 2023-02-12 — End: 2023-08-13

## 2023-05-02 ENCOUNTER — Telehealth: Payer: Self-pay

## 2023-05-02 NOTE — Telephone Encounter (Signed)
 Form received from [facility] and placed into Dr Hardie Shackleton folder

## 2023-05-02 NOTE — Telephone Encounter (Signed)
 Copied from CRM [id number]. Topic: General - Other >> May 02, 2023  2:04 PM Aletta Edouard wrote: Reason for CRM:  Kandace Blitz [phone] is calling in to see if the fax was received that she sent over for patient

## 2023-05-04 IMAGING — US US THYROID
1 series · 13 of 25 positions shown · non-contrast
Comparison: 08/03/2017

CLINICAL DATA: Prior ultrasound follow-up.

EXAM:
THYROID ULTRASOUND
TECHNIQUE: Ultrasound examination of the thyroid gland and adjacent soft
tissues was performed.

[Series 1: us thyroid · 0.05mm/px · 13 of 76 slices shown]
[im 1/76]
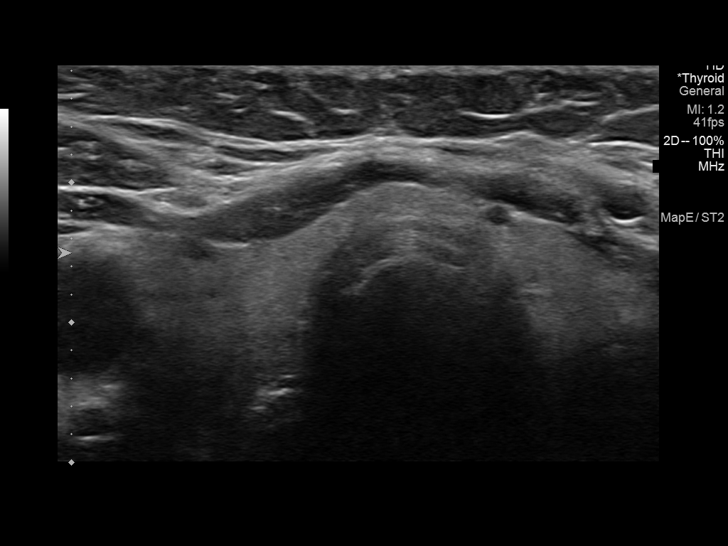
[im 7/76]
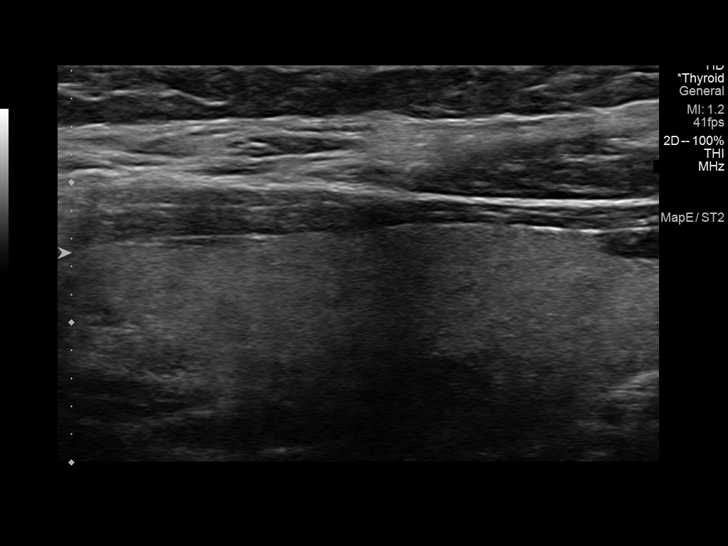
[im 13/76]
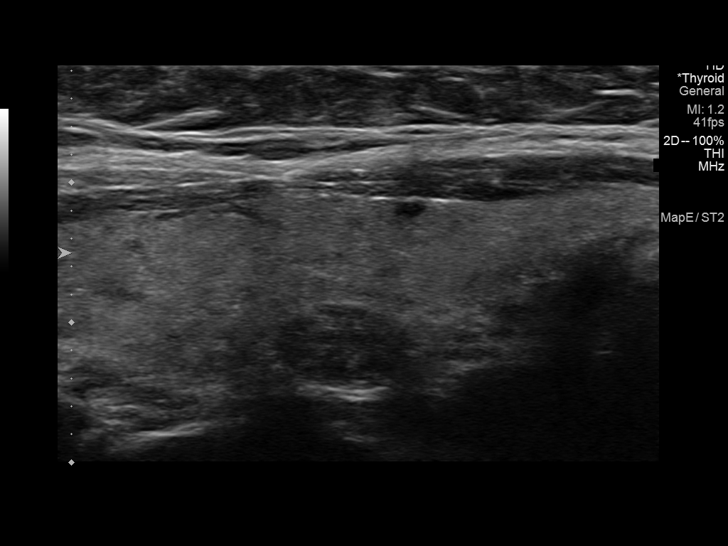
[im 19/76]
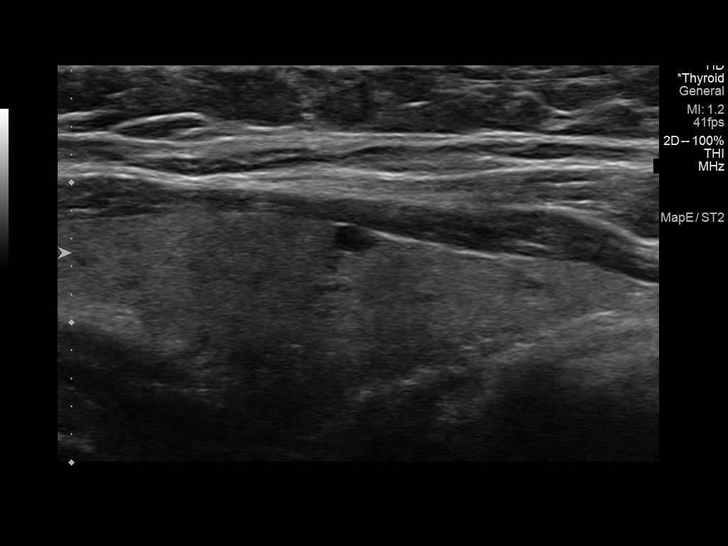
[im 26/76]
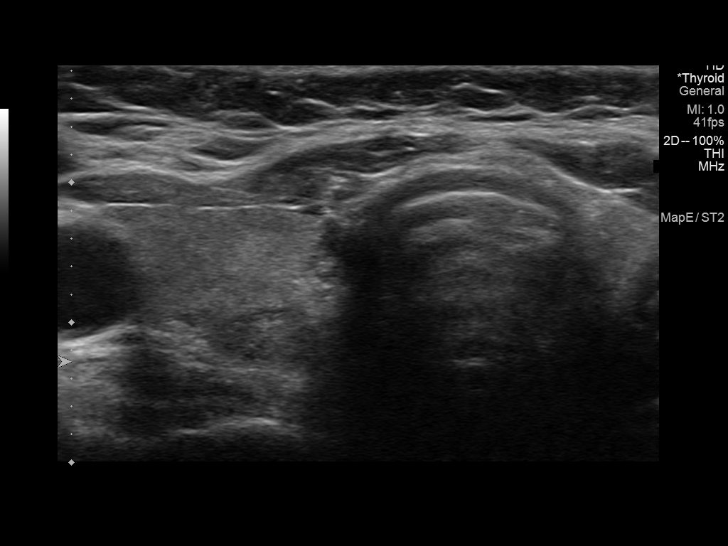
[im 32/76]
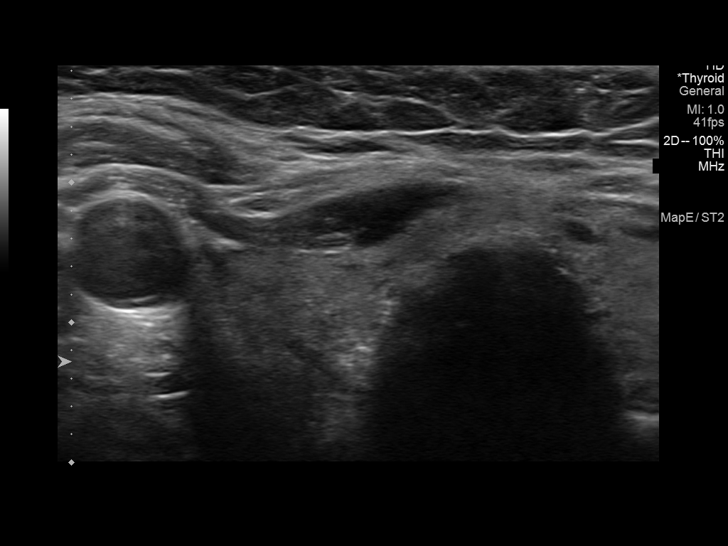
[im 38/76]
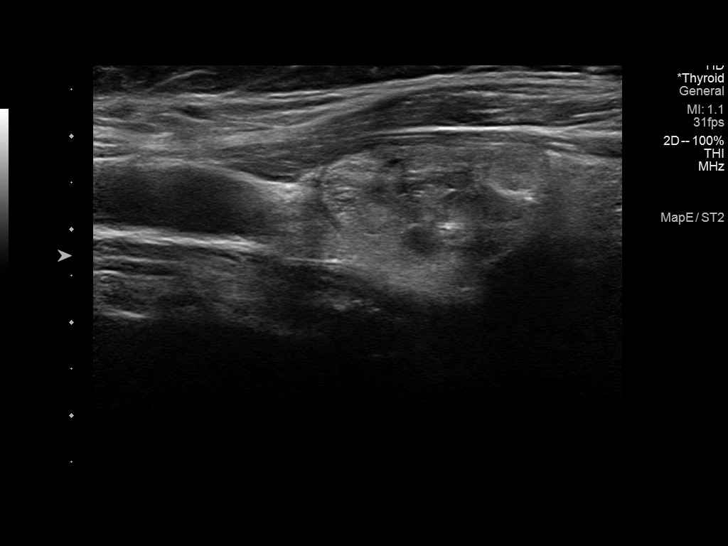
[im 44/76]
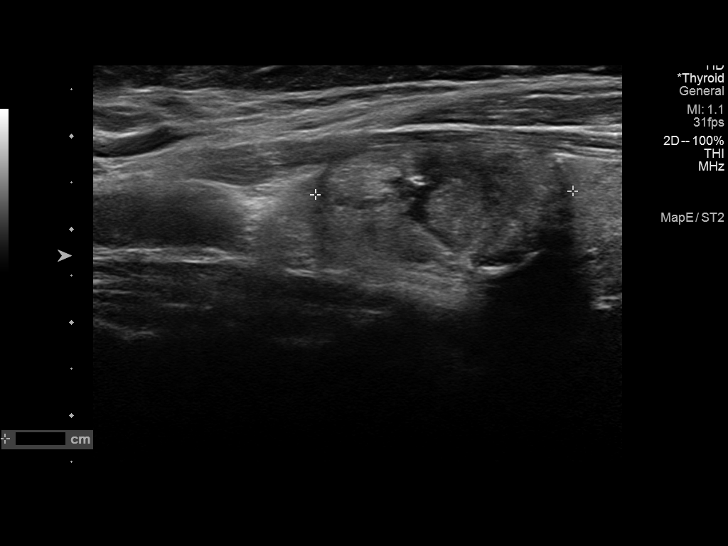
[im 51/76]
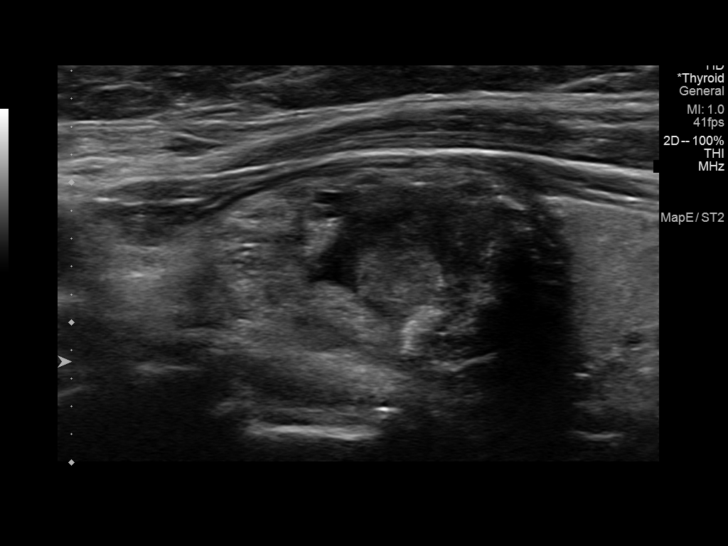
[im 57/76]
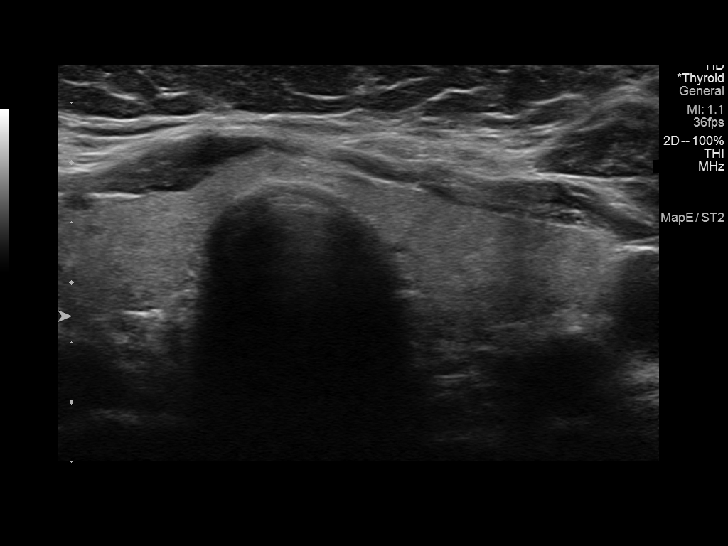
[im 63/76]
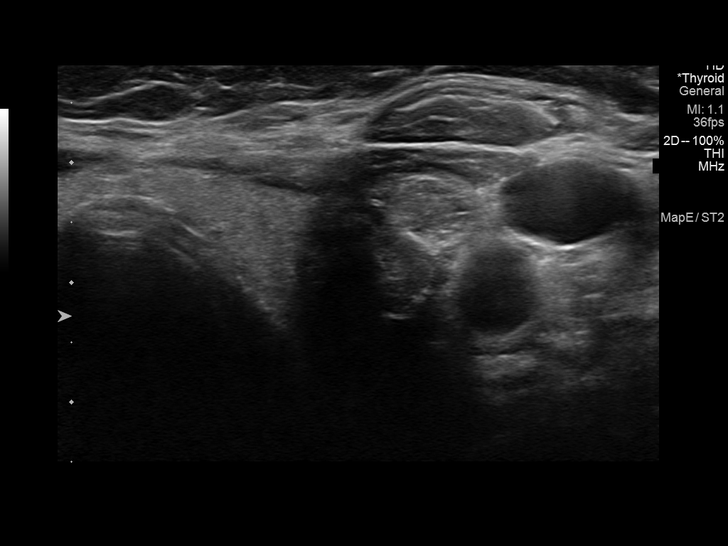
[im 69/76]
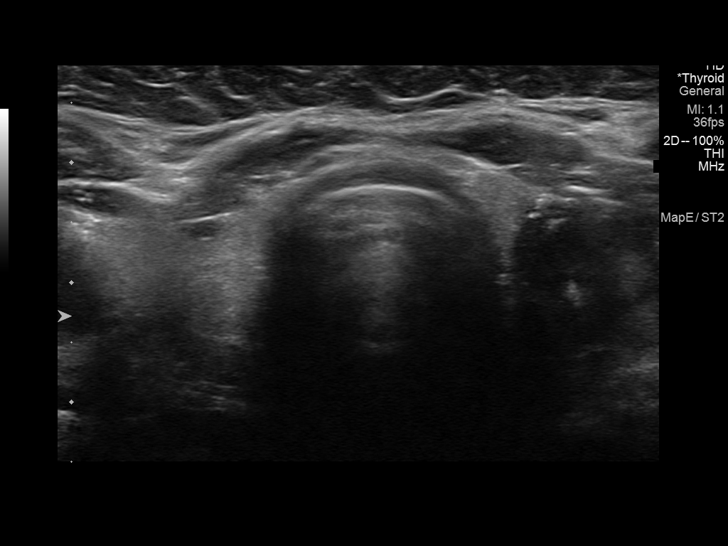
[im 76/76]
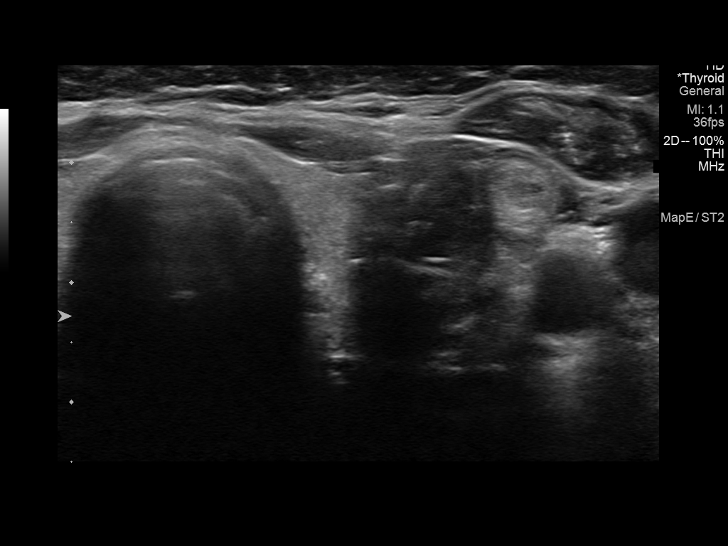

[13 of 25 positions shown; findings below may reference images not displayed]

FINDINGS: Parenchymal Echotexture: Mildly heterogenous

Isthmus: 0.3 cm, previously 0.2 cm

Right lobe: 5.7 x 1.3 x 1.7 cm, previously 5.4 x 1.7 x 1.8 cm

Left lobe: 4.9 x 2.0 x 2.5 cm, previously 5.0 x 2.2 x 2.7 cm

_________________________________________________________

Estimated total number of nodules >/= 1 cm: 2

Number of spongiform nodules >/=  2 cm not described below (TR1): 0

Number of mixed cystic and solid nodules >/= 1.5 cm not described
below (TR2): 0

_________________________________________________________

Nodule # 2:

Prior biopsy: Yes (0881)

Location: Left; Mid

Maximum size: 2.7 cm; Other 2 dimensions: 1.7 x 2.2 cm, previously,
2.9 x 1.8 x 2.2 cm

Composition: solid/almost completely solid (2)

Echogenicity: isoechoic (1)

Shape: not taller-than-wide (0)

Margins: smooth (0)

Echogenic foci: macrocalcifications (1)

ACR TI-RADS total points: 4.

ACR TI-RADS risk category:  TR4 (4-6 points).

Significant change in size (>/= 20% in two dimensions and minimal
increase of 2 mm): No

Change in features: No

Change in ACR TI-RADS risk category: No

ACR TI-RADS recommendations:

Follow-up prior biopsy results.

_________________________________________________________

Unchanged benign spongiform nodule in the right mid thyroid
measuring up to 1.0 cm.
IMPRESSION: Continued interval decreased size of previously biopsied left mid
solid thyroid nodule (labeled 2, 2.7 cm, previously 2.9 cm).
Recommend correlation with prior biopsy results. No further
ultrasound follow-up is necessary if prior pathologic evaluation was
benign.

## 2023-05-10 ENCOUNTER — Telehealth: Payer: Self-pay | Admitting: Internal Medicine

## 2023-05-10 NOTE — Telephone Encounter (Signed)
 Was sent to [place] by mistake.     Copied from CRM [id number]. Topic: Clinical - Request for Lab/Test Order >> May 10, 2023  9:23 AM Alcus Dad wrote: Reason for CRM: Gianna from [employer] is calling to follow up on the fax that she sent. I seen in the notes where papers were put in Dr. Colman Cater. I do not see any updates.

## 2023-05-10 NOTE — Telephone Encounter (Signed)
 E2c2  Brandi Spence is calling checking on the status of form

## 2023-05-17 DIAGNOSIS — G603 Idiopathic progressive neuropathy: Secondary | ICD-10-CM | POA: Diagnosis not present

## 2023-05-17 DIAGNOSIS — G894 Chronic pain syndrome: Secondary | ICD-10-CM | POA: Diagnosis not present

## 2023-05-17 DIAGNOSIS — M7741 Metatarsalgia, right foot: Secondary | ICD-10-CM | POA: Diagnosis not present

## 2023-05-17 DIAGNOSIS — M7742 Metatarsalgia, left foot: Secondary | ICD-10-CM | POA: Diagnosis not present

## 2023-05-21 ENCOUNTER — Ambulatory Visit: Payer: Medicare Other | Admitting: Cardiology

## 2023-05-21 DIAGNOSIS — Z79891 Long term (current) use of opiate analgesic: Secondary | ICD-10-CM | POA: Diagnosis not present

## 2023-05-21 DIAGNOSIS — G894 Chronic pain syndrome: Secondary | ICD-10-CM | POA: Diagnosis not present

## 2023-05-23 ENCOUNTER — Other Ambulatory Visit: Payer: Self-pay | Admitting: Internal Medicine

## 2023-05-31 ENCOUNTER — Encounter (HOSPITAL_COMMUNITY): Payer: Self-pay

## 2023-06-04 ENCOUNTER — Ambulatory Visit (HOSPITAL_COMMUNITY)
Admission: RE | Admit: 2023-06-04 | Discharge: 2023-06-04 | Disposition: A | Discharge status: Home / Self Care | Payer: Medicare Other | Source: Ambulatory Visit | Attending: Cardiology | Admitting: Cardiology

## 2023-06-04 ENCOUNTER — Encounter: Payer: Self-pay | Admitting: Cardiology

## 2023-06-04 DIAGNOSIS — I4819 Other persistent atrial fibrillation: Secondary | ICD-10-CM

## 2023-06-04 MED ORDER — IOHEXOL 350 MG/ML SOLN
95.0000 mL | Freq: Once | INTRAVENOUS | Status: AC | PRN
  Administered 2023-06-04: 95 mL via INTRAVENOUS

## 2023-06-05 ENCOUNTER — Telehealth (HOSPITAL_COMMUNITY): Payer: Self-pay

## 2023-06-05 NOTE — Telephone Encounter (Signed)
 Attempted to reach patient to discuss upcoming procedure, no answer. Left VM for patient to return call.

## 2023-06-06 NOTE — Telephone Encounter (Signed)
 Spoke with patient to complete pre-procedure call.     New medical conditions?  No Recent hospitalizations or surgeries? No Started any new medications? NO Patient made aware to contact office to inform of any new medications started. Any changes in activities of daily living? No  Pre-procedure testing scheduled: CT completed 06/04/23 and lab work ordered- plans  Confirmed patient is taking Eliquis and will continue taking medication before procedure or it may need to be rescheduled.  Confirmed patient is scheduled for Atrial Fibrillation Ablation on Friday, April 25 with Dr. Loman Brooklyn. Instructed patient to arrive at the Main Entrance A at [facility]: [address] [place], [place] [address] and check in at Admitting at 8:00 AM  Advised of plan to go home the same day and will only stay overnight if medically necessary. You MUST have a responsible adult to drive you home and MUST be with you the first 24 hours after you arrive home or your procedure could be cancelled.  Patient verbalized understanding to information provided and is agreeable to proceed with procedure.

## 2023-06-11 ENCOUNTER — Telehealth: Payer: Self-pay | Admitting: Cardiology

## 2023-06-11 NOTE — Telephone Encounter (Signed)
 Brandi Spence is calling with CT over read   6 mm anterior right middle lobe pulmonary nodule. Non-contrast chest CT at 6-12 months is recommended. If the nodule is stable at time of repeat CT, then future CT at 18-24 months (from today's scan) is considered optional for low-risk patients, but is recommended for high-risk patients.

## 2023-06-11 NOTE — Telephone Encounter (Signed)
 Pt made aware of CT overread findings.   Aware forwarding to her PCP office to follow up on nodule when time to do so in 6-12 months.   Patient verbalized understanding and agreeable to plan.

## 2023-06-11 NOTE — Telephone Encounter (Signed)
 Brandi Spence called in stating she needs to report urgent CT chest results.

## 2023-06-13 DIAGNOSIS — I4819 Other persistent atrial fibrillation: Secondary | ICD-10-CM | POA: Diagnosis not present

## 2023-06-13 DIAGNOSIS — Z01812 Encounter for preprocedural laboratory examination: Secondary | ICD-10-CM | POA: Diagnosis not present

## 2023-06-13 DIAGNOSIS — D6869 Other thrombophilia: Secondary | ICD-10-CM | POA: Diagnosis not present

## 2023-06-14 LAB — BASIC METABOLIC PANEL WITH GFR
BUN/Creatinine Ratio: 14 (ref 12–28)
BUN: 11 mg/dL (ref 8–27)
CO2: 24 mmol/L (ref 20–29)
Calcium: 9.4 mg/dL (ref 8.7–10.3)
Chloride: 104 mmol/L (ref 96–106)
Creatinine, Ser: 0.78 mg/dL (ref 0.57–1.00)
Glucose: 96 mg/dL (ref 70–99)
Potassium: 5 mmol/L (ref 3.5–5.2)
Sodium: 143 mmol/L (ref 134–144)
eGFR: 78 mL/min/{1.73_m2} (ref >59)

## 2023-06-14 LAB — CBC
Hematocrit: 38.1 % (ref 34.0–46.6)
Hemoglobin: 12.9 g/dL (ref 11.1–15.9)
MCH: 30.8 pg (ref 26.6–33.0)
MCHC: 33.9 g/dL (ref 31.5–35.7)
MCV: 91 fL (ref 79–97)
Platelets: 246 10*3/uL (ref 150–450)
RBC: 4.19 x10E6/uL (ref 3.77–5.28)
RDW: 13.2 % (ref 11.7–15.4)
WBC: 6.7 10*3/uL (ref 3.4–10.8)

## 2023-06-21 ENCOUNTER — Telehealth (HOSPITAL_COMMUNITY): Payer: Self-pay

## 2023-06-21 NOTE — Telephone Encounter (Signed)
 Call placed to patient to discuss upcoming procedure.   CT: completed.  Labs: completed.   Any recent signs of acute illness or been started on antibiotics? No Any new medications started? No Any medications to hold? No Any missed doses of blood thinner? No Advised patient to continue taking ANTICOAGULANT: Eliquis (Apixaban) without missing any doses.  Medication instructions:  On the morning of your procedure DO NOT take any medication., including Eliquis or the procedure may be rescheduled. Nothing to eat or drink after midnight prior to your procedure.  Confirmed patient is scheduled for Atrial Fibrillation Ablation on Friday, April 25 with Dr. Agatha Horsfall. Instructed patient to arrive at the Main Entrance A at [facility]: [address] [place], [place] [address] and check in at Admitting at 8:00 AM.  Advised of plan to go home the same day and will only stay overnight if medically necessary. You MUST have a responsible adult to drive you home and MUST be with you the first 24 hours after you arrive home or your procedure could be cancelled.  Patient verbalized understanding to all instructions provided and agreed to proceed with procedure.

## 2023-06-23 ENCOUNTER — Other Ambulatory Visit: Payer: Self-pay | Admitting: Internal Medicine

## 2023-06-23 DIAGNOSIS — E559 Vitamin D deficiency, unspecified: Secondary | ICD-10-CM

## 2023-06-28 NOTE — Pre-Procedure Instructions (Signed)
 Instructed patient on the following items: Arrival time 0800 Nothing to eat or drink after midnight No meds AM of procedure Responsible person to drive you home and stay with you for 24 hrs  Have you missed any doses of anti-coagulant Eliquis - takes twice a day, hasn't missed any doses for 4 weeks.  Don't take dose morning of procedure.

## 2023-06-29 ENCOUNTER — Other Ambulatory Visit: Payer: Self-pay

## 2023-06-29 ENCOUNTER — Ambulatory Visit (HOSPITAL_COMMUNITY)
Admission: RE | Admit: 2023-06-29 | Discharge: 2023-06-29 | Disposition: A | Discharge status: Home / Self Care | Payer: Medicare Other | Attending: Cardiology | Admitting: Cardiology

## 2023-06-29 ENCOUNTER — Encounter (HOSPITAL_COMMUNITY)
Admission: RE | Disposition: A | Discharge status: Home / Self Care | Payer: Self-pay | Source: Home / Self Care | Attending: Cardiology

## 2023-06-29 ENCOUNTER — Ambulatory Visit (HOSPITAL_BASED_OUTPATIENT_CLINIC_OR_DEPARTMENT_OTHER)

## 2023-06-29 ENCOUNTER — Ambulatory Visit (HOSPITAL_COMMUNITY)

## 2023-06-29 DIAGNOSIS — E039 Hypothyroidism, unspecified: Secondary | ICD-10-CM

## 2023-06-29 DIAGNOSIS — I10 Essential (primary) hypertension: Secondary | ICD-10-CM | POA: Diagnosis not present

## 2023-06-29 DIAGNOSIS — I4891 Unspecified atrial fibrillation: Secondary | ICD-10-CM | POA: Diagnosis not present

## 2023-06-29 DIAGNOSIS — G629 Polyneuropathy, unspecified: Secondary | ICD-10-CM | POA: Insufficient documentation

## 2023-06-29 DIAGNOSIS — Z87891 Personal history of nicotine dependence: Secondary | ICD-10-CM | POA: Diagnosis not present

## 2023-06-29 DIAGNOSIS — E7800 Pure hypercholesterolemia, unspecified: Secondary | ICD-10-CM

## 2023-06-29 DIAGNOSIS — M1990 Unspecified osteoarthritis, unspecified site: Secondary | ICD-10-CM | POA: Diagnosis not present

## 2023-06-29 DIAGNOSIS — R7303 Prediabetes: Secondary | ICD-10-CM | POA: Diagnosis not present

## 2023-06-29 DIAGNOSIS — Z7901 Long term (current) use of anticoagulants: Secondary | ICD-10-CM | POA: Diagnosis not present

## 2023-06-29 DIAGNOSIS — I4819 Other persistent atrial fibrillation: Secondary | ICD-10-CM | POA: Insufficient documentation

## 2023-06-29 HISTORY — PX: ATRIAL FIBRILLATION ABLATION: EP1191

## 2023-06-29 LAB — POCT ACTIVATED CLOTTING TIME: Activated Clotting Time: 354 s

## 2023-06-29 MED ORDER — FENTANYL CITRATE (PF) 100 MCG/2ML IJ SOLN
INTRAMUSCULAR | Status: AC
  Filled 2023-06-29: qty 2

## 2023-06-29 MED ORDER — PHENYLEPHRINE 80 MCG/ML (10ML) SYRINGE FOR IV PUSH (FOR BLOOD PRESSURE SUPPORT)
PREFILLED_SYRINGE | INTRAVENOUS | Status: DC | PRN
  Administered 2023-06-29 (×2): 80 ug via INTRAVENOUS
  Administered 2023-06-29: 160 ug via INTRAVENOUS
  Administered 2023-06-29: 80 ug via INTRAVENOUS

## 2023-06-29 MED ORDER — HEPARIN SODIUM (PORCINE) 1000 UNIT/ML IJ SOLN
INTRAMUSCULAR | Status: DC | PRN
  Administered 2023-06-29: 14000 [IU] via INTRAVENOUS

## 2023-06-29 MED ORDER — FENTANYL CITRATE (PF) 250 MCG/5ML IJ SOLN
INTRAMUSCULAR | Status: DC | PRN
  Administered 2023-06-29: 50 ug via INTRAVENOUS

## 2023-06-29 MED ORDER — HEPARIN (PORCINE) IN NACL 1000-0.9 UT/500ML-% IV SOLN
INTRAVENOUS | Status: DC | PRN
  Administered 2023-06-29 (×3): 500 mL

## 2023-06-29 MED ORDER — ROCURONIUM BROMIDE 10 MG/ML (PF) SYRINGE
PREFILLED_SYRINGE | INTRAVENOUS | Status: DC | PRN
  Administered 2023-06-29: 60 mg via INTRAVENOUS

## 2023-06-29 MED ORDER — ATROPINE SULFATE 1 MG/10ML IJ SOSY
PREFILLED_SYRINGE | INTRAMUSCULAR | Status: DC | PRN
  Administered 2023-06-29: 1 mg via INTRAVENOUS

## 2023-06-29 MED ORDER — SODIUM CHLORIDE 0.9 % IV SOLN: 250.0000 mL | INTRAVENOUS | Status: DC | PRN

## 2023-06-29 MED ORDER — ACETAMINOPHEN 325 MG PO TABS: 650.0000 mg | ORAL_TABLET | ORAL | Status: DC | PRN

## 2023-06-29 MED ORDER — SODIUM CHLORIDE 0.9 % IV SOLN: INTRAVENOUS | Status: DC

## 2023-06-29 MED ORDER — SODIUM CHLORIDE 0.9% FLUSH: 3.0000 mL | INTRAVENOUS | Status: DC | PRN

## 2023-06-29 MED ORDER — SODIUM CHLORIDE 0.9% FLUSH: 3.0000 mL | Freq: Two times a day (BID) | INTRAVENOUS | Status: DC

## 2023-06-29 MED ORDER — SUGAMMADEX SODIUM 200 MG/2ML IV SOLN
INTRAVENOUS | Status: DC | PRN
  Administered 2023-06-29: 180 mg via INTRAVENOUS

## 2023-06-29 MED ORDER — PROPOFOL 10 MG/ML IV BOLUS
INTRAVENOUS | Status: DC | PRN
Start: 2023-06-29 — End: 2023-06-29
  Administered 2023-06-29: 100 mg via INTRAVENOUS

## 2023-06-29 MED ORDER — PHENYLEPHRINE HCL-NACL 20-0.9 MG/250ML-% IV SOLN
INTRAVENOUS | Status: DC | PRN
  Administered 2023-06-29: 25 ug/min via INTRAVENOUS

## 2023-06-29 MED ORDER — DEXAMETHASONE SODIUM PHOSPHATE 10 MG/ML IJ SOLN
INTRAMUSCULAR | Status: DC | PRN
  Administered 2023-06-29: 5 mg via INTRAVENOUS

## 2023-06-29 MED ORDER — ONDANSETRON HCL 4 MG/2ML IJ SOLN: 4.0000 mg | Freq: Four times a day (QID) | INTRAMUSCULAR | Status: DC | PRN

## 2023-06-29 MED ORDER — ONDANSETRON HCL 4 MG/2ML IJ SOLN
INTRAMUSCULAR | Status: DC | PRN
  Administered 2023-06-29: 4 mg via INTRAVENOUS

## 2023-06-29 MED ORDER — PROTAMINE SULFATE 10 MG/ML IV SOLN
INTRAVENOUS | Status: DC | PRN
  Administered 2023-06-29: 40 mg via INTRAVENOUS

## 2023-06-29 MED ORDER — LIDOCAINE 2% (20 MG/ML) 5 ML SYRINGE
INTRAMUSCULAR | Status: DC | PRN
  Administered 2023-06-29: 80 mg via INTRAVENOUS

## 2023-06-29 NOTE — Anesthesia Preprocedure Evaluation (Signed)
 Anesthesia Evaluation  Patient identified by MRN, date of birth, ID band Patient awake    Reviewed: Allergy & Precautions, NPO status , Patient's Chart, lab work & pertinent test results  Airway Mallampati: II  TM Distance: >3 FB Neck ROM: Full    Dental no notable dental hx.    Pulmonary former smoker   Pulmonary exam normal breath sounds clear to auscultation       Cardiovascular Exercise Tolerance: Good hypertension, Pt. on medications Normal cardiovascular exam+ dysrhythmias (on Eliquis ; last dose of eliquis  09/09/21) Atrial Fibrillation  Rhythm:Regular Rate:Normal     Neuro/Psych  Neuromuscular disease (peripheral neuropathy)  negative psych ROS   GI/Hepatic ,GERD  ,,  Endo/Other  Hypothyroidism  Pre-diabetes  Renal/GU   negative genitourinary   Musculoskeletal  (+) Arthritis  (chronic pain), Osteoarthritis,    Abdominal   Peds negative pediatric ROS (+)  Hematology   Anesthesia Other Findings   Reproductive/Obstetrics negative OB ROS                              Anesthesia Physical Anesthesia Plan  ASA: 3  Anesthesia Plan: General   Post-op Pain Management:    Induction: Intravenous  PONV Risk Score and Plan: 2 and Ondansetron , Dexamethasone  and Treatment may vary due to age or medical condition  Airway Management Planned: Oral ETT  Additional Equipment: None  Intra-op Plan:   Post-operative Plan: Extubation in OR  Informed Consent: I have reviewed the patients History and Physical, chart, labs and discussed the procedure including the risks, benefits and alternatives for the proposed anesthesia with the patient or authorized representative who has indicated his/her understanding and acceptance.     Dental advisory given  Plan Discussed with: CRNA  Anesthesia Plan Comments:          Anesthesia Quick Evaluation

## 2023-06-29 NOTE — Anesthesia Postprocedure Evaluation (Signed)
 Anesthesia Post Note  Patient: Brandi Spence  Procedure(s) Performed: ATRIAL FIBRILLATION ABLATION     Patient location during evaluation: PACU Anesthesia Type: General Level of consciousness: awake and alert Pain management: pain level controlled Vital Signs Assessment: post-procedure vital signs reviewed and stable Respiratory status: spontaneous breathing, nonlabored ventilation, respiratory function stable and patient connected to nasal cannula oxygen Cardiovascular status: blood pressure returned to baseline and stable Postop Assessment: no apparent nausea or vomiting Anesthetic complications: no   There were no known notable events for this encounter.  Last Vitals:  Vitals:   06/29/23 1410 06/29/23 1430  BP: 126/60 (!) 128/59  Pulse: 60 63  Resp: 15 16  Temp:    SpO2: 98% 97%    Last Pain:  Vitals:   06/29/23 1250  TempSrc:   PainSc: 0-No pain                 Lethaniel Rave

## 2023-06-29 NOTE — Discharge Instructions (Addendum)

## 2023-06-29 NOTE — Anesthesia Procedure Notes (Signed)
 Procedure Name: Intubation Date/Time: 06/29/2023 10:13 AM  Performed by: Raylene Calamity, CRNAPre-anesthesia Checklist: Patient identified, Emergency Drugs available, Suction available and Patient being monitored Patient Re-evaluated:Patient Re-evaluated prior to induction Oxygen Delivery Method: Circle System Utilized Preoxygenation: Pre-oxygenation with 100% oxygen Induction Type: IV induction Ventilation: Mask ventilation without difficulty and Oral airway inserted - appropriate to patient size Laryngoscope Size: Annabell Key and 3 Grade View: Grade II Tube type: Oral Tube size: 7.0 mm Number of attempts: 1 Airway Equipment and Method: Stylet and Oral airway Placement Confirmation: ETT inserted through vocal cords under direct vision, positive ETCO2 and breath sounds checked- equal and bilateral Secured at: 21 cm Tube secured with: Tape Dental Injury: Teeth and Oropharynx as per pre-operative assessment

## 2023-06-29 NOTE — H&P (Signed)
  Electrophysiology Office Note:   Date:  06/29/2023  ID:  Brandi Spence, Homer 20-Jan-1946, MRN [id number]  Primary Cardiologist: Bridgette Campus, MD Primary Heart Failure: None Electrophysiologist: Monee Dembeck Cortland Ding, MD      History of Present Illness:   Brandi Spence is a 78 y.o. female with h/o hypertension, hypothyroidism, hyperlipidemia, atrial fibrillation seen today for routine electrophysiology followup.   Today, denies symptoms of palpitations, chest pain, shortness of breath, orthopnea, PND, lower extremity edema, claudication, dizziness, presyncope, syncope, bleeding, or neurologic sequela. The patient is tolerating medications without difficulties. Plan ablation today.   EP Information / Studies Reviewed:    EKG is not ordered today. EKG from 02/19/23 reviewed which showed sinus rhythm        Risk Assessment/Calculations:    CHA2DS2-VASc Score =     This indicates a  % annual risk of stroke. The patient's score is based upon:              Physical Exam:   VS:  BP (!) 140/83   Pulse 67   Temp 97.9 F (36.6 C) (Oral)   Resp 18   Ht 5\' 7"  (1.702 m)   Wt 86.2 kg   SpO2 96%   BMI 29.76 kg/m    Wt Readings from Last 3 Encounters:  06/29/23 86.2 kg  03/15/23 91.1 kg  03/15/23 90.7 kg    GEN: No acute distress.   Neck: No JVD Cardiac: RRR, no murmurs, rubs, or gallops.  Respiratory: decreased BS bases bilaterally. GI: Soft, nontender, non-distended  MS: No edema; No deformity. Neuro:  Nonfocal  Skin: warm and dry, Psych: Normal affect    ASSESSMENT AND PLAN:    1.  Persistent atrial fibrillation: Brandi Spence has presented today for surgery, with the diagnosis of AF.  The various methods of treatment have been discussed with the patient and family. After consideration of risks, benefits and other options for treatment, the patient has consented to  Procedure(s): Catheter ablation as a surgical intervention .  Risks include but not limited to  complete heart block, stroke, esophageal damage, nerve damage, bleeding, vascular damage, tamponade, perforation, MI, and death. The patient's history has been reviewed, patient examined, no change in status, stable for surgery.  I have reviewed the patient's chart and labs.  Questions were answered to the patient's satisfaction.    Kahley Leib Lawana Pray, MD 06/29/2023 9:17 AM

## 2023-06-29 NOTE — Transfer of Care (Signed)
 Immediate Anesthesia Transfer of Care Note  Patient: Brandi Spence  Procedure(s) Performed: ATRIAL FIBRILLATION ABLATION  Patient Location: PACU  Anesthesia Type:General  Level of Consciousness: awake, alert , and oriented  Airway & Oxygen Therapy: Patient Spontanous Breathing and Patient connected to face mask oxygen  Post-op Assessment: Report given to RN and Post -op Vital signs reviewed and stable  Post vital signs: Reviewed and stable  Last Vitals:  Vitals Value Taken Time  BP    Temp    Pulse 63 06/29/23 1147  Resp 9 06/29/23 1147  SpO2 100 % 06/29/23 1147  Vitals shown include unfiled device data.  Last Pain:  Vitals:   06/29/23 0851  TempSrc:   PainSc: 0-No pain         Complications: There were no known notable events for this encounter.

## 2023-06-30 ENCOUNTER — Encounter (HOSPITAL_COMMUNITY): Payer: Self-pay | Admitting: Cardiology

## 2023-07-02 ENCOUNTER — Telehealth (HOSPITAL_COMMUNITY): Payer: Self-pay

## 2023-07-02 MED FILL — Fentanyl Citrate Preservative Free (PF) Inj 100 MCG/2ML: INTRAMUSCULAR | Qty: 1 | Status: AC

## 2023-07-02 NOTE — Telephone Encounter (Signed)
 Spoke with patient to complete post procedure follow up call.  Patient removed large bandage at puncture sites after 24 hours and reports no complications with groin sites.   Instructions reviewed with patient:  It is normal to have bruising, tenderness and a pea or marble sized lump/knot at the groin site which can take up to three months to resolve.  Get help right away if you notice sudden swelling at the puncture site.  Check your puncture site every day for signs of infection: fever, redness, swelling, pus drainage, warmth, foul odor or excessive pain. If this occurs, please call the office at [phone], to speak with the nurse. Get help right away if your puncture site is bleeding and the bleeding does not stop after applying firm pressure to the area.  You may continue to have skipped beats/ atrial fibrillation during the first several months after your procedure.  It is very important not to miss any doses of your blood thinner Eliquis . Patient restarted taking this medication on 06/29/23.   You will follow up with the Afib clinic 07/27/23 and follow up with the APP on 10/01/23.   Patient verbalized understanding to all instructions provided.

## 2023-07-22 ENCOUNTER — Other Ambulatory Visit (HOSPITAL_COMMUNITY): Payer: Self-pay | Admitting: Physician Assistant

## 2023-07-25 DIAGNOSIS — B079 Viral wart, unspecified: Secondary | ICD-10-CM | POA: Diagnosis not present

## 2023-07-25 DIAGNOSIS — H61002 Unspecified perichondritis of left external ear: Secondary | ICD-10-CM | POA: Diagnosis not present

## 2023-07-25 DIAGNOSIS — D485 Neoplasm of uncertain behavior of skin: Secondary | ICD-10-CM | POA: Diagnosis not present

## 2023-07-25 DIAGNOSIS — Z85828 Personal history of other malignant neoplasm of skin: Secondary | ICD-10-CM | POA: Diagnosis not present

## 2023-07-25 DIAGNOSIS — D1801 Hemangioma of skin and subcutaneous tissue: Secondary | ICD-10-CM | POA: Diagnosis not present

## 2023-07-27 ENCOUNTER — Encounter (HOSPITAL_COMMUNITY): Payer: Self-pay | Admitting: Physician Assistant

## 2023-07-27 ENCOUNTER — Ambulatory Visit (HOSPITAL_COMMUNITY)
Admission: RE | Admit: 2023-07-27 | Discharge: 2023-07-27 | Disposition: A | Discharge status: Home / Self Care | Source: Ambulatory Visit | Attending: Physician Assistant | Admitting: Physician Assistant

## 2023-07-27 VITALS — BP 126/70 | HR 62 | Ht 67.0 in | Wt 197.6 lb

## 2023-07-27 DIAGNOSIS — D6869 Other thrombophilia: Secondary | ICD-10-CM | POA: Diagnosis not present

## 2023-07-27 DIAGNOSIS — I4819 Other persistent atrial fibrillation: Secondary | ICD-10-CM | POA: Insufficient documentation

## 2023-07-27 DIAGNOSIS — Z79899 Other long term (current) drug therapy: Secondary | ICD-10-CM | POA: Insufficient documentation

## 2023-07-27 DIAGNOSIS — Z5181 Encounter for therapeutic drug level monitoring: Secondary | ICD-10-CM | POA: Diagnosis not present

## 2023-07-27 NOTE — Progress Notes (Signed)
 Primary Care Physician: Zilphia Hilt, Charyl Coppersmith, MD Primary Cardiologist: Dr Alois Arnt Primary Electrophysiologist: Dr Lawana Pray Referring Physician: Dr Alois Arnt   Brandi Spence is a 78 y.o. female with a history of HTN, CAD, hypothyroid, HLD, atrial fibrillation who presents for follow up in the [facility]. The patient was initially diagnosed with atrial fibrillation 11/04/20 at a visit with her PCP although she was not told she had afib at that time. She was unaware of her arrhythmia with no symptoms. She was scheduled for total knee replacement on 01/17/21 but this was cancelled due to patient being in afib. Patient is on Eliquis  for a CHADS2VASC score of 4. Seen by Dr Amanda Jungling on 01/18/21 and started on BB. Patient is s/p DCCV on 02/08/21. Unfortunately, she was back in afib at follow up. She purchased a Kardia mobile device on 02/11/21 which showed afib at that time. Patient is s/p DCCV on 03/28/21. Her BB was stopped and her flecainide  was decreased due to bradycardia. She did feel better in SR with less dyspnea on exertion.   Patient is s/p dofetilide  admission 3/7-3/10/23 with DCCV on 05/12/21.   Patient returns for follow up for atrial fibrillation and dofetilide  monitoring. Patient was seen by Dr Lawana Pray for increased AF burden and underwent afib ablation on 06/29/23. She did have two episodes of afib within the first 2 weeks post ablation but has not had any since. She denies chest pain or groin issues.   Today, she denies symptoms of palpitations, chest pain, shortness of breath, orthopnea, PND, lower extremity edema, dizziness, presyncope, syncope, snoring, daytime somnolence, bleeding, or neurologic sequela. The patient is tolerating medications without difficulties and is otherwise without complaint today.    Atrial Fibrillation Risk Factors:  she does not have symptoms or diagnosis of sleep apnea. she does not have a history of rheumatic fever. she  does not have a history of alcohol  use. The patient does not have a history of early familial atrial fibrillation or other arrhythmias.   Atrial Fibrillation Management history:  Previous antiarrhythmic drugs: flecainide , dofetilide    Previous cardioversions: 02/08/21, 03/28/21, 05/12/21 Previous ablations: 06/29/23 Anticoagulation history: Eliquis    Past Medical History:  Diagnosis Date   Arthritis    Dysrhythmia    A.fib   Family history of adverse reaction to anesthesia    Mother and sister have PONV   History of kidney stones    Hyperlipidemia    Hypertension    Hypothyroidism    Neuropathy    feet   Pre-diabetes    Primary localized osteoarthritis of right knee 01/05/2021    Current Outpatient Medications  Medication Sig Dispense Refill   apixaban  (ELIQUIS ) 5 MG TABS tablet Take 1 tablet (5 mg total) by mouth 2 (two) times daily. 28 tablet    cetirizine (ZYRTEC) 10 MG tablet Take 10 mg by mouth at bedtime.     diltiazem  (CARDIZEM ) 30 MG tablet Take 1 tablet (30 mg total) by mouth 2 (two) times daily. 180 tablet 1   docusate sodium  (COLACE) 100 MG capsule Take 100 mg by mouth at bedtime.     dofetilide  (TIKOSYN ) 250 MCG capsule TAKE 1 CAPSULE BY MOUTH 2 TIMES A DAY 180 capsule 2   FIBER PO Take 2 tablets by mouth at bedtime.     furosemide  (LASIX ) 20 MG tablet TAKE 1 TABLET (20 MG TOTAL) BY MOUTH DAILY AS NEEDED FOR EDEMA. 90 tablet 1   levothyroxine  (SYNTHROID , LEVOTHROID) 75 MCG  tablet Take 75 mcg by mouth daily before breakfast.     lisinopril  (ZESTRIL ) 10 MG tablet TAKE 1 TABLET BY MOUTH EVERY DAY 90 tablet 1   MELATONIN PO Take 1 tablet by mouth at bedtime.     oxyCODONE -acetaminophen  (PERCOCET/ROXICET) 5-325 MG tablet Take 1 tablet by mouth every 6 (six) hours as needed for severe pain (pain score 7-10).     potassium gluconate 595 (99 K) MG TABS tablet Take 595 mg by mouth daily.     pregabalin  (LYRICA ) 200 MG capsule Take 200 mg by mouth in the morning and at  bedtime.     simvastatin  (ZOCOR ) 20 MG tablet TAKE 1 TABLET BY MOUTH EVERYDAY AT BEDTIME 90 tablet 1   No current facility-administered medications for this encounter.    ROS- All systems are reviewed and negative except as per the HPI above.  Physical Exam: Vitals:   07/27/23 1022  BP: 126/70  Pulse: 62  Weight: 89.6 kg  Height: 5\' 7"  (1.702 m)    GEN: Well nourished, well developed in no acute distress CARDIAC: Regular rate and rhythm, no murmurs, rubs, gallops RESPIRATORY:  Clear to auscultation without rales, wheezing or rhonchi  ABDOMEN: Soft, non-tender, non-distended EXTREMITIES:  No edema; No deformity    Wt Readings from Last 3 Encounters:  07/27/23 89.6 kg  06/29/23 86.2 kg  03/15/23 91.1 kg    EKG today demonstrates  SR Vent. rate 62 BPM PR interval 192 ms QRS duration 68 ms QT/QTcB 442/448 ms   Echo 02/01/21 demonstrated  1. Left ventricular ejection fraction, by estimation, is 55 to 60%. The  left ventricle has normal function. The left ventricle has no regional  wall motion abnormalities. Left ventricular diastolic function could not  be evaluated.   2. Right ventricular systolic function is low normal. The right  ventricular size is normal. There is moderately elevated pulmonary artery systolic pressure. The estimated right ventricular systolic pressure is 47.3 mmHg.   3. Left atrial size was moderately dilated.   4. The mitral valve is grossly normal. Mild to moderate mitral valve  regurgitation.   5. The aortic valve is tricuspid. Aortic valve regurgitation is not  visualized.   6. The inferior vena cava is dilated in size with <50% respiratory  variability, suggesting right atrial pressure of 15 mmHg.   Comparison(s): No prior Echocardiogram.   Epic records are reviewed at length today  CHA2DS2-VASc Score = 5  The patient's score is based upon: CHF History: 0 HTN History: 1 Diabetes History: 0 Stroke History: 0 Vascular Disease  History: 1 Age Score: 2 Gender Score: 1       ASSESSMENT AND PLAN: Persistent Atrial Fibrillation (ICD10:  I48.19) The patient's CHA2DS2-VASc score is 5, indicating a 7.2% annual risk of stroke.   S/p dofetilide  loading 05/2021 S/p afib ablation 06/29/23 Patient appears to be maintaining SR Continue dofetilide  250 mcg BID Continue diltiazem  30 mg PRN q 4 hours for heart racing Continue Eliquis  5 mg BID with no missed doses for 3 months post ablation.  Kardia mobile for home monitoring   Secondary Hypercoagulable State (ICD10:  [phone]) The patient is at significant risk for stroke/thromboembolism based upon her CHA2DS2-VASc Score of 5.  Continue Apixaban  (Eliquis ). No bleeding issues.   High Risk Medication Monitoring (ICD 10: Z79.899) QT interval on ECG acceptable for dofetilide  monitoring.   HTN Stable on current regimen    Follow up with Michaelle Adolphus as scheduled.    Ricky Shams Fill PA-C Afib  Clinic [facility] [address] [place], [place] [address] [phone] 07/27/2023 11:00 AM

## 2023-08-01 ENCOUNTER — Other Ambulatory Visit (HOSPITAL_COMMUNITY): Payer: Self-pay | Admitting: Physician Assistant

## 2023-08-03 ENCOUNTER — Other Ambulatory Visit: Payer: Self-pay | Admitting: Endocrinology

## 2023-08-03 DIAGNOSIS — E041 Nontoxic single thyroid nodule: Secondary | ICD-10-CM

## 2023-08-08 ENCOUNTER — Ambulatory Visit
Admission: RE | Admit: 2023-08-08 | Discharge: 2023-08-08 | Discharge status: Home / Self Care | Source: Ambulatory Visit | Attending: Endocrinology | Admitting: Endocrinology

## 2023-08-08 DIAGNOSIS — E041 Nontoxic single thyroid nodule: Secondary | ICD-10-CM | POA: Diagnosis not present

## 2023-08-13 ENCOUNTER — Other Ambulatory Visit (HOSPITAL_COMMUNITY): Payer: Self-pay | Admitting: *Deleted

## 2023-08-13 DIAGNOSIS — I4819 Other persistent atrial fibrillation: Secondary | ICD-10-CM

## 2023-08-13 MED ORDER — APIXABAN 5 MG PO TABS: 5.0000 mg | ORAL_TABLET | Freq: Two times a day (BID) | ORAL | 2 refills | Status: DC

## 2023-08-20 DIAGNOSIS — G603 Idiopathic progressive neuropathy: Secondary | ICD-10-CM | POA: Diagnosis not present

## 2023-08-20 DIAGNOSIS — M7742 Metatarsalgia, left foot: Secondary | ICD-10-CM | POA: Diagnosis not present

## 2023-08-20 DIAGNOSIS — G894 Chronic pain syndrome: Secondary | ICD-10-CM | POA: Diagnosis not present

## 2023-08-20 DIAGNOSIS — M7741 Metatarsalgia, right foot: Secondary | ICD-10-CM | POA: Diagnosis not present

## 2023-08-23 ENCOUNTER — Other Ambulatory Visit (HOSPITAL_COMMUNITY): Payer: Self-pay | Admitting: *Deleted

## 2023-08-23 MED ORDER — DOFETILIDE 250 MCG PO CAPS: 250.0000 ug | ORAL_CAPSULE | Freq: Two times a day (BID) | ORAL | 2 refills | Status: DC

## 2023-08-27 DIAGNOSIS — I8312 Varicose veins of left lower extremity with inflammation: Secondary | ICD-10-CM | POA: Diagnosis not present

## 2023-08-27 DIAGNOSIS — R7301 Impaired fasting glucose: Secondary | ICD-10-CM | POA: Diagnosis not present

## 2023-08-27 DIAGNOSIS — I83893 Varicose veins of bilateral lower extremities with other complications: Secondary | ICD-10-CM | POA: Diagnosis not present

## 2023-08-27 DIAGNOSIS — M79661 Pain in right lower leg: Secondary | ICD-10-CM | POA: Diagnosis not present

## 2023-08-27 DIAGNOSIS — I8311 Varicose veins of right lower extremity with inflammation: Secondary | ICD-10-CM | POA: Diagnosis not present

## 2023-08-27 DIAGNOSIS — I87393 Chronic venous hypertension (idiopathic) with other complications of bilateral lower extremity: Secondary | ICD-10-CM | POA: Diagnosis not present

## 2023-08-27 DIAGNOSIS — E039 Hypothyroidism, unspecified: Secondary | ICD-10-CM | POA: Diagnosis not present

## 2023-08-30 ENCOUNTER — Other Ambulatory Visit (HOSPITAL_COMMUNITY): Payer: Self-pay | Admitting: *Deleted

## 2023-09-03 DIAGNOSIS — R7301 Impaired fasting glucose: Secondary | ICD-10-CM | POA: Diagnosis not present

## 2023-09-03 DIAGNOSIS — I482 Chronic atrial fibrillation, unspecified: Secondary | ICD-10-CM | POA: Diagnosis not present

## 2023-09-03 DIAGNOSIS — I10 Essential (primary) hypertension: Secondary | ICD-10-CM | POA: Diagnosis not present

## 2023-09-03 DIAGNOSIS — E041 Nontoxic single thyroid nodule: Secondary | ICD-10-CM | POA: Diagnosis not present

## 2023-09-03 DIAGNOSIS — Z85828 Personal history of other malignant neoplasm of skin: Secondary | ICD-10-CM | POA: Diagnosis not present

## 2023-09-03 DIAGNOSIS — E039 Hypothyroidism, unspecified: Secondary | ICD-10-CM | POA: Diagnosis not present

## 2023-09-03 DIAGNOSIS — R21 Rash and other nonspecific skin eruption: Secondary | ICD-10-CM | POA: Diagnosis not present

## 2023-09-03 DIAGNOSIS — E559 Vitamin D deficiency, unspecified: Secondary | ICD-10-CM | POA: Diagnosis not present

## 2023-09-03 DIAGNOSIS — M8580 Other specified disorders of bone density and structure, unspecified site: Secondary | ICD-10-CM | POA: Diagnosis not present

## 2023-09-03 DIAGNOSIS — H61002 Unspecified perichondritis of left external ear: Secondary | ICD-10-CM | POA: Diagnosis not present

## 2023-09-05 ENCOUNTER — Other Ambulatory Visit (HOSPITAL_COMMUNITY): Payer: Self-pay | Admitting: Physician Assistant

## 2023-09-05 DIAGNOSIS — I83891 Varicose veins of right lower extremities with other complications: Secondary | ICD-10-CM | POA: Diagnosis not present

## 2023-09-11 DIAGNOSIS — I83892 Varicose veins of left lower extremities with other complications: Secondary | ICD-10-CM | POA: Diagnosis not present

## 2023-09-18 DIAGNOSIS — I83892 Varicose veins of left lower extremities with other complications: Secondary | ICD-10-CM | POA: Diagnosis not present

## 2023-09-20 DIAGNOSIS — I87391 Chronic venous hypertension (idiopathic) with other complications of right lower extremity: Secondary | ICD-10-CM | POA: Diagnosis not present

## 2023-09-20 DIAGNOSIS — I83891 Varicose veins of right lower extremities with other complications: Secondary | ICD-10-CM | POA: Diagnosis not present

## 2023-09-21 ENCOUNTER — Other Ambulatory Visit: Payer: Self-pay | Admitting: Internal Medicine

## 2023-10-01 ENCOUNTER — Encounter: Payer: Self-pay | Admitting: Student

## 2023-10-01 ENCOUNTER — Ambulatory Visit: Attending: Student | Admitting: Student

## 2023-10-01 VITALS — BP 100/58 | HR 62 | Ht 67.0 in | Wt 195.0 lb

## 2023-10-01 DIAGNOSIS — Z79899 Other long term (current) drug therapy: Secondary | ICD-10-CM | POA: Diagnosis not present

## 2023-10-01 DIAGNOSIS — Z5181 Encounter for therapeutic drug level monitoring: Secondary | ICD-10-CM | POA: Diagnosis not present

## 2023-10-01 DIAGNOSIS — I4819 Other persistent atrial fibrillation: Secondary | ICD-10-CM | POA: Diagnosis not present

## 2023-10-01 MED ORDER — DILTIAZEM HCL ER COATED BEADS 120 MG PO CP24: 120.0000 mg | ORAL_CAPSULE | Freq: Every day | ORAL | 3 refills | Status: AC

## 2023-10-01 MED ORDER — DILTIAZEM HCL 30 MG PO TABS: 30.0000 mg | ORAL_TABLET | ORAL | 1 refills | Status: AC | PRN

## 2023-10-01 NOTE — Patient Instructions (Signed)
 Medication Instructions:  Start diltiazem  120 mg daily *If you need a refill on your cardiac medications before your next appointment, please call your pharmacy*  Lab Work: BMET, MAG-TODAY If you have labs (blood work) drawn today and your tests are completely normal, you will receive your results only by: MyChart Message (if you have MyChart) OR A paper copy in the mail If you have any lab test that is abnormal or we need to change your treatment, we will call you to review the results.  Follow-Up: At [facility], you and your health needs are our priority.  As part of our continuing mission to provide you with exceptional heart care, our providers are all part of one team.  This team includes your primary Cardiologist (physician) and Advanced Practice Providers or APPs (Physician Assistants and Nurse Practitioners) who all work together to provide you with the care you need, when you need it.  Your next appointment:   6 month(s)  Provider:   Soyla Norton, MD

## 2023-10-01 NOTE — Progress Notes (Signed)
  Electrophysiology Office Note:   Date:  10/01/2023  ID:  Daina, Cara 1945/12/08, MRN [id number]  Primary Cardiologist: Alvan Ronal BRAVO, MD (Inactive) Electrophysiologist: Will Gladis Norton, MD   Electrophysiologist:  Soyla Gladis Norton, MD      History of Present Illness:   Brandi Spence is a 78 y.o. female with h/o HTN, CAD, hypothyroid, HLD, and persistent atrial fibrillation  seen today for routine electrophysiology follow-up s/p Ablation.  Since last being seen in our clinic the patient reports doing OK. She has continued to have brief palpitations every few weeks. Crist says possible AF and HRs in 140-160s at the time. Otherwise, she denies chest pain, palpitations, dyspnea, PND, orthopnea, nausea, vomiting, dizziness, syncope, edema, weight gain, or early satiety.    Review of systems complete and found to be negative unless listed in HPI.   EP Information / Studies Reviewed:    EKG is ordered today. Personal review as below.  EKG Interpretation Date/Time:  Monday October 01 2023 10:09:52 EDT Ventricular Rate:  62 PR Interval:  192 QRS Duration:  72 QT Interval:  440 QTC Calculation: 446 R Axis:   -10  Text Interpretation: Normal sinus rhythm Low voltage QRS When compared with ECG of 27-Jul-2023 10:25, Nonspecific T wave abnormality, improved in Anterior leads Confirmed by Lesia Sharper [phone]) on 10/01/2023 10:17:16 AM    Arrhythmia/Device History S/p PVI and posterior wall ablation 06/2023 (PFA)    Physical Exam:   VS:  BP (!) 100/58   Pulse 62   Ht 5' 7 (1.702 m)   Wt 195 lb (88.5 kg)   SpO2 94%   BMI 30.54 kg/m    Wt Readings from Last 3 Encounters:  10/01/23 195 lb (88.5 kg)  07/27/23 197 lb 9.6 oz (89.6 kg)  06/29/23 190 lb (86.2 kg)     GEN: No acute distress NECK: No JVD; No carotid bruits CARDIAC: Regular rate and rhythm, no murmurs, rubs, gallops RESPIRATORY:  Clear to auscultation without rales, wheezing or rhonchi  ABDOMEN: Soft,  non-tender, non-distended EXTREMITIES:  No edema; No deformity   ASSESSMENT AND PLAN:    Persistent AF S/p PVI 06/2023  EKG today shows NSR with stable intervals on Tikosyn  Continue tikosyn  250 mcg BID Labs today Add diltiazem  120 mg daily for ongoing palpitations Continue eliquis  5 mg BID for CHA2DS2VASc of at least 5. She would potentially be interested in [place].   ? If having SVT vs brief flutter. Prn dilt helps, so will try with long acting.   High risk medication monitoring - Tikosyn  Patient requires ongoing monitoring for anti-arrhythmic medication which has the potential to cause life threatening arrhythmias.   Secondary hypercoagulable state Pt on Eliquis  as above   Follow up with Dr. Norton in 6 months  Signed, Sharper Prentice Lesia, PA-C

## 2023-10-02 ENCOUNTER — Ambulatory Visit: Payer: Self-pay | Admitting: Student

## 2023-10-02 DIAGNOSIS — G4719 Other hypersomnia: Secondary | ICD-10-CM | POA: Diagnosis not present

## 2023-10-02 LAB — BASIC METABOLIC PANEL WITH GFR
BUN/Creatinine Ratio: 16 (ref 12–28)
BUN: 13 mg/dL (ref 8–27)
CO2: 24 mmol/L (ref 20–29)
Calcium: 9.5 mg/dL (ref 8.7–10.3)
Chloride: 102 mmol/L (ref 96–106)
Creatinine, Ser: 0.79 mg/dL (ref 0.57–1.00)
Glucose: 83 mg/dL (ref 70–99)
Potassium: 4.6 mmol/L (ref 3.5–5.2)
Sodium: 141 mmol/L (ref 134–144)
eGFR: 77 mL/min/1.73 (ref >59)

## 2023-10-02 LAB — MAGNESIUM: Magnesium: 2.2 mg/dL (ref 1.6–2.3)

## 2023-10-08 DIAGNOSIS — I83891 Varicose veins of right lower extremities with other complications: Secondary | ICD-10-CM | POA: Diagnosis not present

## 2023-10-10 DIAGNOSIS — I87392 Chronic venous hypertension (idiopathic) with other complications of left lower extremity: Secondary | ICD-10-CM | POA: Diagnosis not present

## 2023-10-10 DIAGNOSIS — I83892 Varicose veins of left lower extremities with other complications: Secondary | ICD-10-CM | POA: Diagnosis not present

## 2023-10-16 DIAGNOSIS — H18593 Other hereditary corneal dystrophies, bilateral: Secondary | ICD-10-CM | POA: Diagnosis not present

## 2023-10-16 DIAGNOSIS — H26491 Other secondary cataract, right eye: Secondary | ICD-10-CM | POA: Diagnosis not present

## 2023-10-16 DIAGNOSIS — H04123 Dry eye syndrome of bilateral lacrimal glands: Secondary | ICD-10-CM | POA: Diagnosis not present

## 2023-10-16 DIAGNOSIS — H40013 Open angle with borderline findings, low risk, bilateral: Secondary | ICD-10-CM | POA: Diagnosis not present

## 2023-10-17 DIAGNOSIS — E039 Hypothyroidism, unspecified: Secondary | ICD-10-CM | POA: Diagnosis not present

## 2023-10-17 DIAGNOSIS — R7301 Impaired fasting glucose: Secondary | ICD-10-CM | POA: Diagnosis not present

## 2023-10-17 DIAGNOSIS — E041 Nontoxic single thyroid nodule: Secondary | ICD-10-CM | POA: Diagnosis not present

## 2023-10-17 DIAGNOSIS — I10 Essential (primary) hypertension: Secondary | ICD-10-CM | POA: Diagnosis not present

## 2023-10-17 DIAGNOSIS — M8580 Other specified disorders of bone density and structure, unspecified site: Secondary | ICD-10-CM | POA: Diagnosis not present

## 2023-10-17 DIAGNOSIS — G609 Hereditary and idiopathic neuropathy, unspecified: Secondary | ICD-10-CM | POA: Diagnosis not present

## 2023-10-17 DIAGNOSIS — E559 Vitamin D deficiency, unspecified: Secondary | ICD-10-CM | POA: Diagnosis not present

## 2023-10-17 DIAGNOSIS — I482 Chronic atrial fibrillation, unspecified: Secondary | ICD-10-CM | POA: Diagnosis not present

## 2023-10-30 DIAGNOSIS — G478 Other sleep disorders: Secondary | ICD-10-CM | POA: Diagnosis not present

## 2023-11-01 ENCOUNTER — Other Ambulatory Visit (HOSPITAL_COMMUNITY): Payer: Self-pay | Admitting: Physician Assistant

## 2023-11-03 DIAGNOSIS — Z23 Encounter for immunization: Secondary | ICD-10-CM | POA: Diagnosis not present

## 2023-11-07 DIAGNOSIS — G4733 Obstructive sleep apnea (adult) (pediatric): Secondary | ICD-10-CM | POA: Diagnosis not present

## 2023-11-14 DIAGNOSIS — M13841 Other specified arthritis, right hand: Secondary | ICD-10-CM | POA: Diagnosis not present

## 2023-11-14 DIAGNOSIS — M65352 Trigger finger, left little finger: Secondary | ICD-10-CM | POA: Diagnosis not present

## 2023-11-14 DIAGNOSIS — M65351 Trigger finger, right little finger: Secondary | ICD-10-CM | POA: Diagnosis not present

## 2023-11-14 DIAGNOSIS — G4733 Obstructive sleep apnea (adult) (pediatric): Secondary | ICD-10-CM | POA: Diagnosis not present

## 2023-11-19 DIAGNOSIS — I8311 Varicose veins of right lower extremity with inflammation: Secondary | ICD-10-CM | POA: Diagnosis not present

## 2023-11-19 DIAGNOSIS — R600 Localized edema: Secondary | ICD-10-CM | POA: Diagnosis not present

## 2023-11-19 DIAGNOSIS — M7741 Metatarsalgia, right foot: Secondary | ICD-10-CM | POA: Diagnosis not present

## 2023-11-19 DIAGNOSIS — G894 Chronic pain syndrome: Secondary | ICD-10-CM | POA: Diagnosis not present

## 2023-11-19 DIAGNOSIS — L819 Disorder of pigmentation, unspecified: Secondary | ICD-10-CM | POA: Diagnosis not present

## 2023-11-19 DIAGNOSIS — G603 Idiopathic progressive neuropathy: Secondary | ICD-10-CM | POA: Diagnosis not present

## 2023-11-19 DIAGNOSIS — I83893 Varicose veins of bilateral lower extremities with other complications: Secondary | ICD-10-CM | POA: Diagnosis not present

## 2023-11-19 DIAGNOSIS — M7742 Metatarsalgia, left foot: Secondary | ICD-10-CM | POA: Diagnosis not present

## 2023-11-19 DIAGNOSIS — I8312 Varicose veins of left lower extremity with inflammation: Secondary | ICD-10-CM | POA: Diagnosis not present

## 2023-12-13 DIAGNOSIS — E039 Hypothyroidism, unspecified: Secondary | ICD-10-CM | POA: Diagnosis not present

## 2023-12-13 DIAGNOSIS — R7301 Impaired fasting glucose: Secondary | ICD-10-CM | POA: Diagnosis not present

## 2023-12-13 DIAGNOSIS — E559 Vitamin D deficiency, unspecified: Secondary | ICD-10-CM | POA: Diagnosis not present

## 2023-12-18 DIAGNOSIS — G609 Hereditary and idiopathic neuropathy, unspecified: Secondary | ICD-10-CM | POA: Diagnosis not present

## 2023-12-18 DIAGNOSIS — E041 Nontoxic single thyroid nodule: Secondary | ICD-10-CM | POA: Diagnosis not present

## 2023-12-18 DIAGNOSIS — E559 Vitamin D deficiency, unspecified: Secondary | ICD-10-CM | POA: Diagnosis not present

## 2023-12-18 DIAGNOSIS — E039 Hypothyroidism, unspecified: Secondary | ICD-10-CM | POA: Diagnosis not present

## 2023-12-18 DIAGNOSIS — I10 Essential (primary) hypertension: Secondary | ICD-10-CM | POA: Diagnosis not present

## 2023-12-18 DIAGNOSIS — R7301 Impaired fasting glucose: Secondary | ICD-10-CM | POA: Diagnosis not present

## 2023-12-18 DIAGNOSIS — M8580 Other specified disorders of bone density and structure, unspecified site: Secondary | ICD-10-CM | POA: Diagnosis not present

## 2023-12-19 DIAGNOSIS — Z1231 Encounter for screening mammogram for malignant neoplasm of breast: Secondary | ICD-10-CM | POA: Diagnosis not present

## 2023-12-19 LAB — HM MAMMOGRAPHY

## 2023-12-20 ENCOUNTER — Encounter: Payer: Self-pay | Admitting: Internal Medicine

## 2024-03-18 ENCOUNTER — Encounter: Payer: Self-pay | Admitting: Internal Medicine

## 2024-03-18 ENCOUNTER — Ambulatory Visit: Admitting: Family Medicine

## 2024-03-23 ENCOUNTER — Other Ambulatory Visit (HOSPITAL_COMMUNITY): Payer: Self-pay | Admitting: Physician Assistant

## 2024-03-23 DIAGNOSIS — I4819 Other persistent atrial fibrillation: Secondary | ICD-10-CM

## 2024-03-25 ENCOUNTER — Ambulatory Visit: Admitting: Student

## 2024-03-25 ENCOUNTER — Encounter: Payer: Self-pay | Admitting: Student

## 2024-03-25 VITALS — BP 138/86 | HR 78 | Ht 67.0 in | Wt 186.0 lb

## 2024-03-25 DIAGNOSIS — Z79899 Other long term (current) drug therapy: Secondary | ICD-10-CM | POA: Diagnosis not present

## 2024-03-25 DIAGNOSIS — I4819 Other persistent atrial fibrillation: Secondary | ICD-10-CM | POA: Insufficient documentation

## 2024-03-25 DIAGNOSIS — D6869 Other thrombophilia: Secondary | ICD-10-CM | POA: Diagnosis not present

## 2024-03-25 DIAGNOSIS — Z5181 Encounter for therapeutic drug level monitoring: Secondary | ICD-10-CM | POA: Insufficient documentation

## 2024-03-25 NOTE — Progress Notes (Signed)
" °  Electrophysiology Office Note:   Date:  03/25/2024  ID:  Alva, Kuenzel 1945/03/16, MRN [id number]  Primary Cardiologist: Alvan Ronal BRAVO, MD (Inactive) Electrophysiologist: Will Gladis Norton, MD   Electrophysiologist:  Soyla Gladis Norton, MD      History of Present Illness:   Brandi Spence is a 79 y.o. female with h/o HTN, CAD, hypothyroid, HLD, and persistent atrial fibrillation seen today for routine electrophysiology followup.   Since last being seen in our clinic the patient reports doing well overall. Her palpitations smoothed out and she has had no further. Overall, she denies chest pain, dyspnea, PND, orthopnea, nausea, vomiting, dizziness, syncope, edema, weight gain, or early satiety.   Review of systems complete and found to be negative unless listed in HPI.   EP Information / Studies Reviewed:    EKG is ordered today. Personal review as below.  EKG Interpretation Date/Time:  Tuesday March 25 2024 11:22:25 EST Ventricular Rate:  75 PR Interval:  178 QRS Duration:  70 QT Interval:  410 QTC Calculation: 457 R Axis:   7  Text Interpretation: Normal sinus rhythm Confirmed by Lesia Heck ([id number]) on 03/25/2024 11:28:30 AM    Arrhythmia/Device History S/p PVI and posterior wall ablation 06/2023 (PFA)    Physical Exam:   VS:  BP 138/86   Pulse 78   Ht 5' 7 (1.702 m)   Wt 186 lb (84.4 kg)   SpO2 98%   BMI 29.13 kg/m    Wt Readings from Last 3 Encounters:  03/25/24 186 lb (84.4 kg)  10/01/23 195 lb (88.5 kg)  07/27/23 197 lb 9.6 oz (89.6 kg)     GEN: No acute distress NECK: No JVD; No carotid bruits CARDIAC: Regular rate and rhythm, no murmurs, rubs, gallops RESPIRATORY:  Clear to auscultation without rales, wheezing or rhonchi  ABDOMEN: Soft, non-tender, non-distended EXTREMITIES:  No edema; No deformity   ASSESSMENT AND PLAN:    Persistent AF Secondary hypercoagulable state S/p PVI 06/2023 EKG today shows NSR with stable  intervals Continue tikosyn  250 mcg BID for now. She recently had it filled, but will consider coming off it as she finished this prescription. She understands if she stops, re-loading would need to occur in the hospital. She also understands she would be a candidate for re-do ablation if she had recurrence off Tikosyn . Labs today Continue diltiazem  120 mg daily Continue eliquis  5 mg BID for CHA2DS2VASc of at least 5. Potentially interested in [place] in the future.   High risk medication monitoring - Tikosyn  Patient requires ongoing monitoring for anti-arrhythmic medication which has the potential to cause life threatening arrhythmias or AV block.    Follow up with Dr. Norton in 6 months  Signed, Ozell Prentice Lesia, PA-C  "

## 2024-03-25 NOTE — Patient Instructions (Signed)
 Medication Instructions:  No medication changes today. *If you need a refill on your cardiac medications before your next appointment, please call your pharmacy*  Lab Work: BMET and Mg today If you have labs (blood work) drawn today and your tests are completely normal, you will receive your results only by: MyChart Message (if you have MyChart) OR A paper copy in the mail If you have any lab test that is abnormal or we need to change your treatment, we will call you to review the results.  Testing/Procedures: No testing ordered today  Follow-Up: At [facility], you and your health needs are our priority.  As part of our continuing mission to provide you with exceptional heart care, our providers are all part of one team.  This team includes your primary Cardiologist (physician) and Advanced Practice Providers or APPs (Physician Assistants and Nurse Practitioners) who all work together to provide you with the care you need, when you need it.  Your next appointment:   6 month(s)  Provider:    Will Gladis Norton, MD   We recommend signing up for the patient portal called MyChart.  Sign up information is provided on this After Visit Summary.  MyChart is used to connect with patients for Virtual Visits (Telemedicine).  Patients are able to view lab/test results, encounter notes, upcoming appointments, etc.  Non-urgent messages can be sent to your provider as well.   To learn more about what you can do with MyChart, go to [email or web address].

## 2024-03-26 ENCOUNTER — Ambulatory Visit: Payer: Self-pay | Admitting: Student

## 2024-03-26 LAB — BASIC METABOLIC PANEL WITH GFR
BUN/Creatinine Ratio: 20 (ref 12–28)
BUN: 16 mg/dL (ref 8–27)
CO2: 25 mmol/L (ref 20–29)
Calcium: 9.7 mg/dL (ref 8.7–10.3)
Chloride: 103 mmol/L (ref 96–106)
Creatinine, Ser: 0.81 mg/dL (ref 0.57–1.00)
Glucose: 88 mg/dL (ref 70–99)
Potassium: 4.8 mmol/L (ref 3.5–5.2)
Sodium: 143 mmol/L (ref 134–144)
eGFR: 74 mL/min/1.73

## 2024-03-26 LAB — MAGNESIUM: Magnesium: 2.3 mg/dL (ref 1.6–2.3)

## 2024-04-14 ENCOUNTER — Ambulatory Visit: Admitting: Internal Medicine

## 2024-04-24 IMAGING — US US THYROID
1 series · 13 of 25 positions shown · non-contrast
Comparison: Multiple prior thyroid ultrasound studies with the
latest dated 07/13/2020

CLINICAL DATA: Prior ultrasound follow-up. Prior fine-needle
aspiration of 3 cm left thyroid nodule in 4660 with benign cytology.

EXAM:
THYROID ULTRASOUND
TECHNIQUE: Ultrasound examination of the thyroid gland and adjacent soft
tissues was performed.

[Series 1: us thyroid · 0.05mm/px · 13 of 64 slices shown]
[im 1/64]
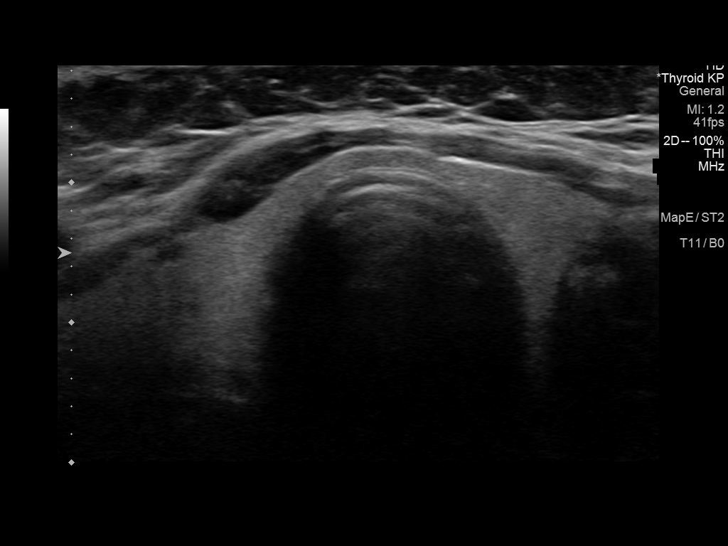
[im 6/64]
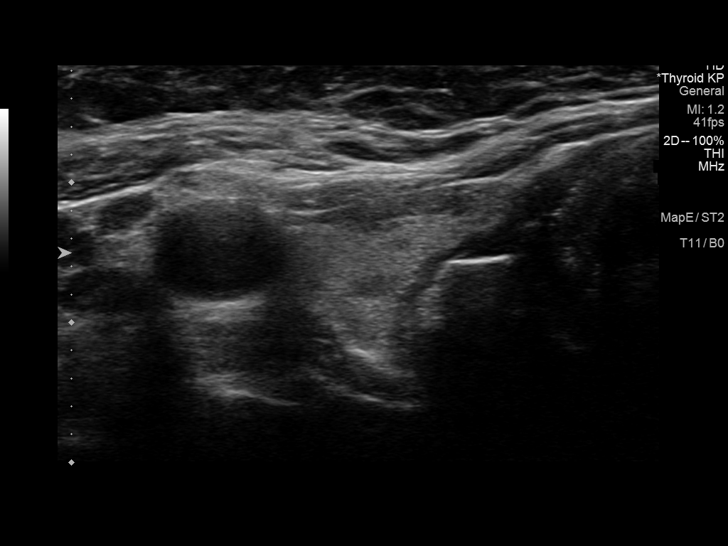
[im 11/64]
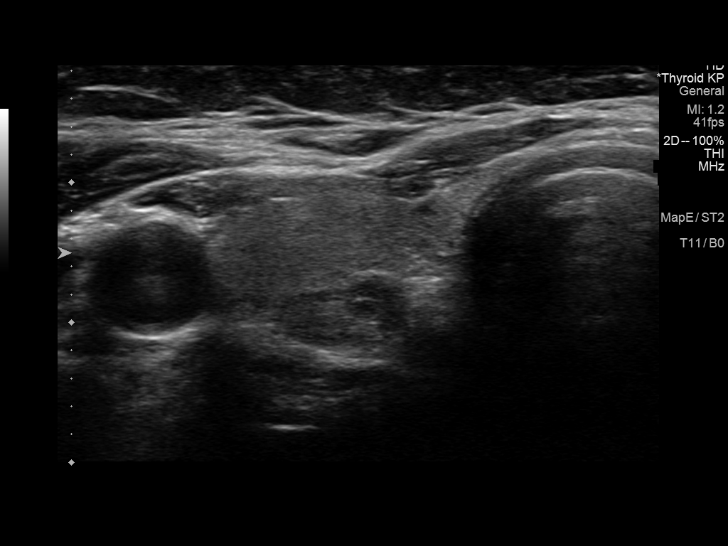
[im 16/64]
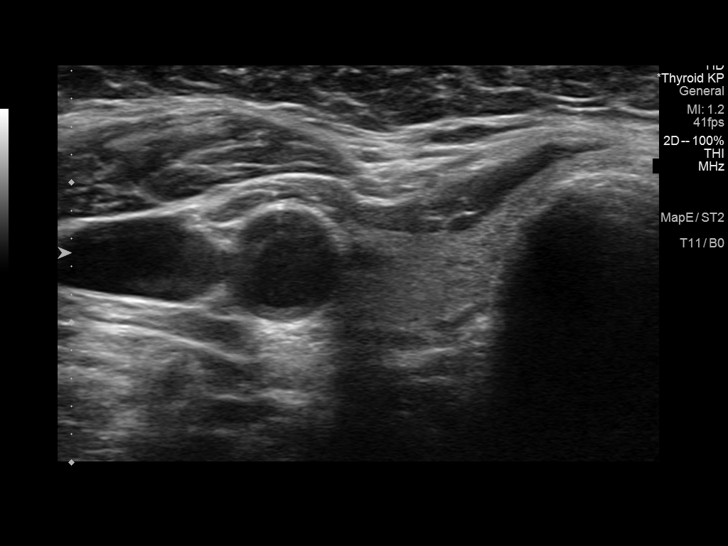
[im 22/64]
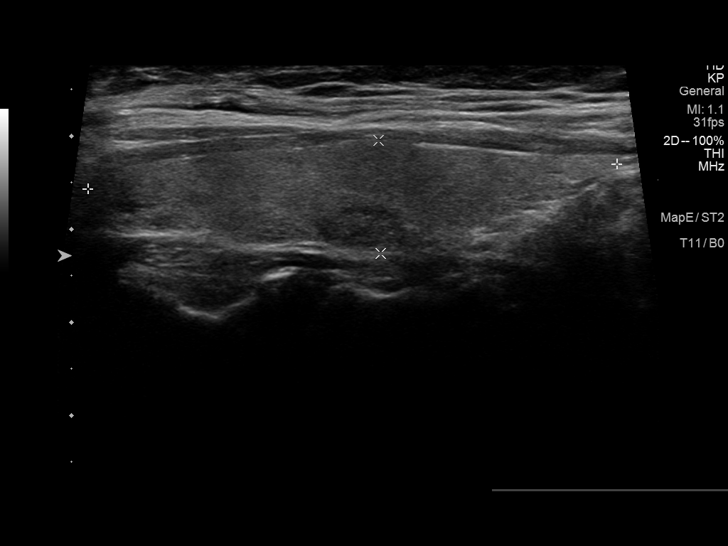
[im 27/64]
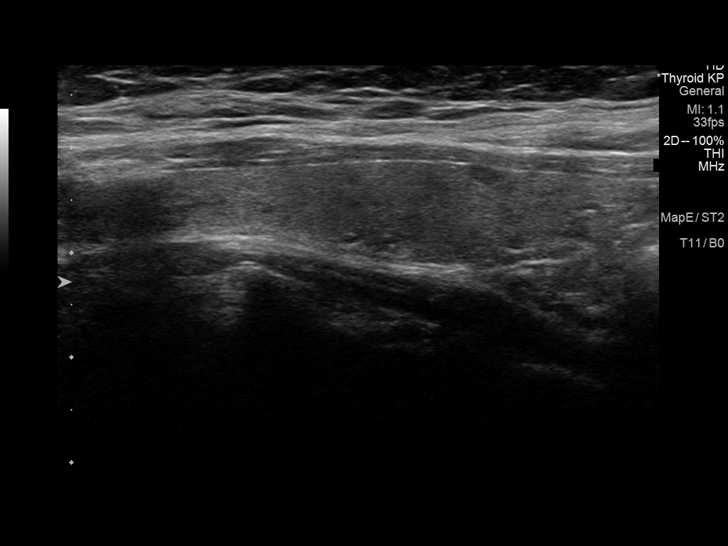
[im 32/64]
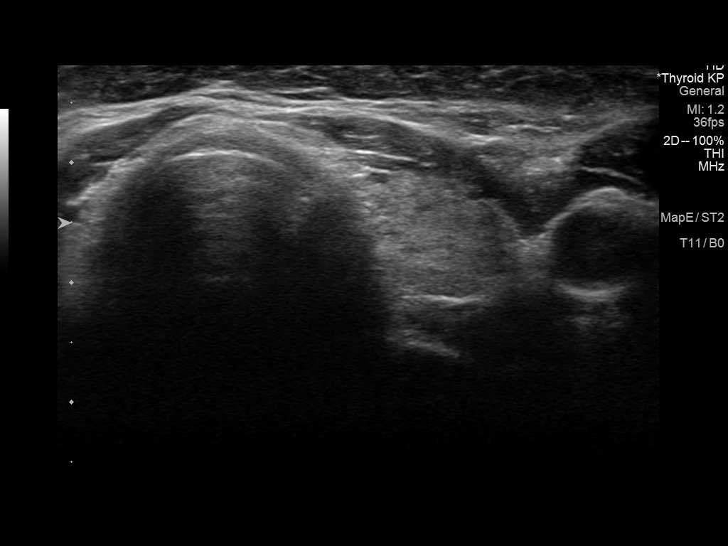
[im 37/64]
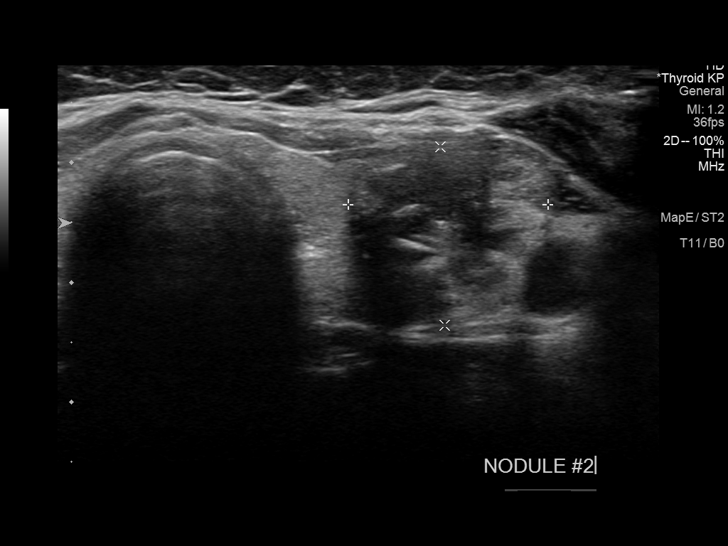
[im 43/64]
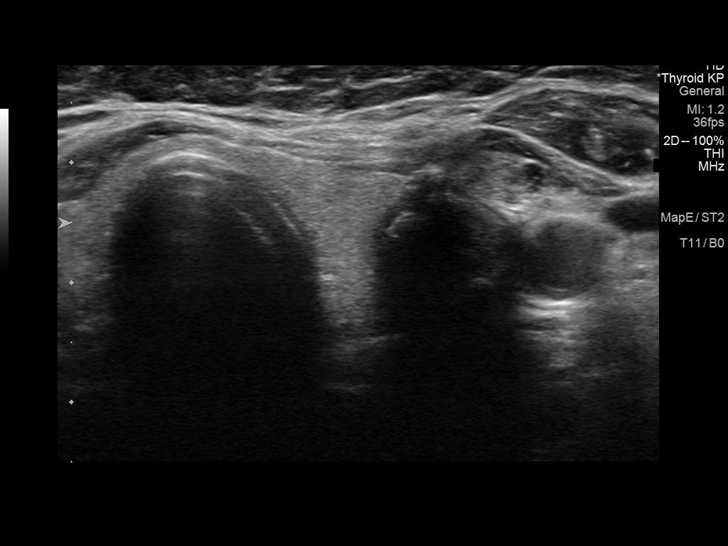
[im 48/64]
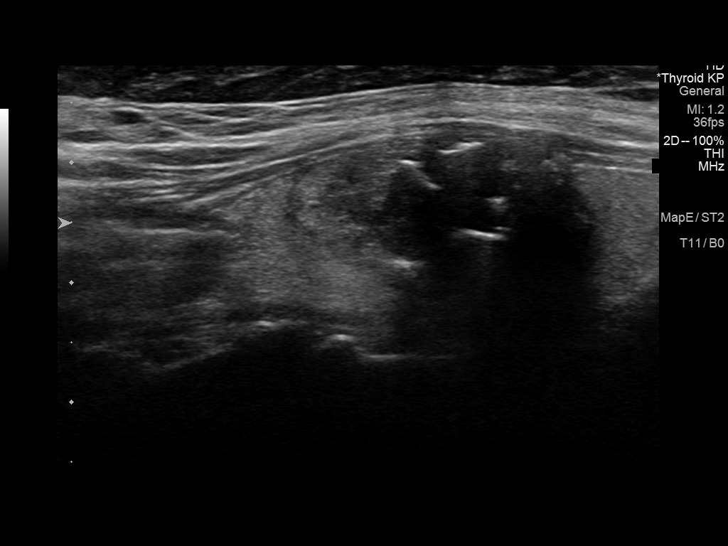
[im 53/64]
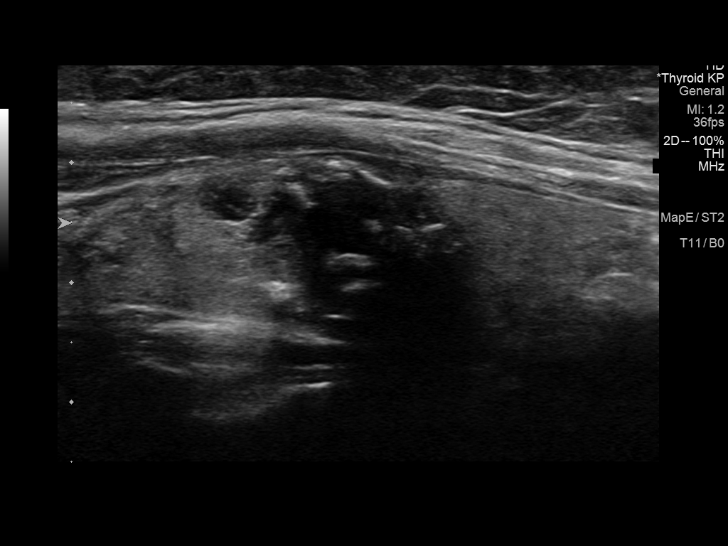
[im 58/64]
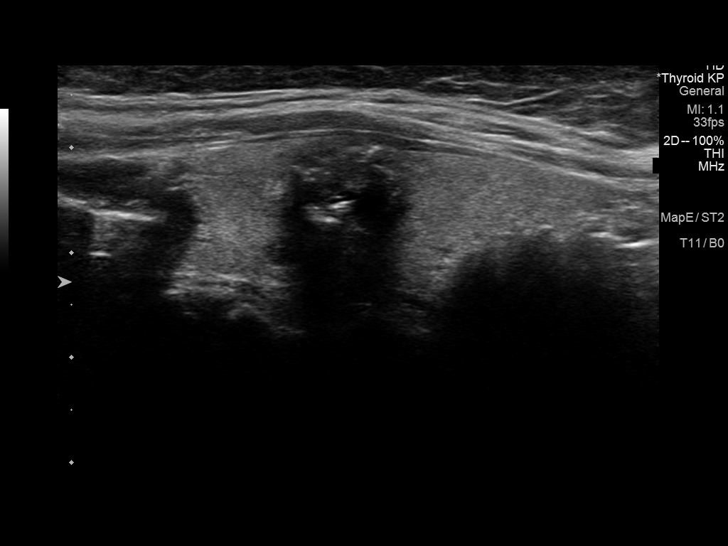
[im 64/64]
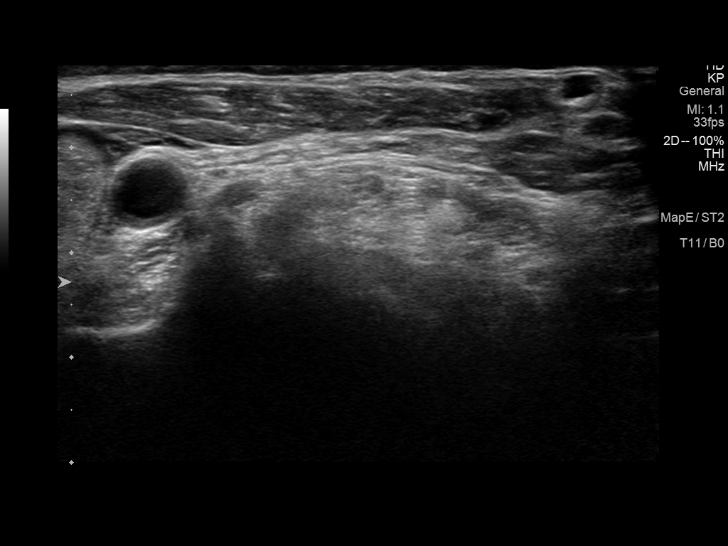

[13 of 25 positions shown; findings below may reference images not displayed]

FINDINGS: Parenchymal Echotexture: Mildly heterogenous

Isthmus: 0.2 cm

Right lobe: 5.7 x 1.2 x 1.7 cm

Left lobe: 4.9 x 1.5 x 2.4 cm

_________________________________________________________

Estimated total number of nodules >/= 1 cm: 2

Number of spongiform nodules >/=  2 cm not described below (TR1): 0

Number of mixed cystic and solid nodules >/= 1.5 cm not described
below (TR2): 0

_________________________________________________________

Nodule # 2:

Prior biopsy: Yes

Location: Left; Mid

Maximum size: 2.7 cm; Other 2 dimensions: 1.7 x 1.5 cm, previously,
2.7 cm

Composition: solid/almost completely solid (2)

Echogenicity: isoechoic (1)

Shape: not taller-than-wide (0)

Margins: smooth (0)

Echogenic foci: macrocalcifications (1)

ACR TI-RADS total points: 4.

ACR TI-RADS risk category:  TR4 (4-6 points).

Significant change in size (>/= 20% in two dimensions and minimal
increase of 2 mm): No

Change in features: No

Change in ACR TI-RADS risk category: No

ACR TI-RADS recommendations:

Stable previously sampled 2.7 cm left mid thyroid nodule.

_________________________________________________________

1 cm right mid thyroid nodule is stable and does not meet criteria
for follow-up.
IMPRESSION: Stable previously sampled 2.7 cm left mid thyroid nodule.

The above is in keeping with the ACR TI-RADS recommendations - [HOSPITAL] 1058;[DATE].

## 2024-08-22 ENCOUNTER — Ambulatory Visit: Admitting: Cardiology
